# Patient Record
Sex: Male | Born: 1990 | Race: White | Hispanic: No | Marital: Single | Smoking: Current every day smoker
Health system: Southern US, Community
[De-identification: ages and names within clinical notes are randomized; demographics above are authoritative.]

## PROBLEM LIST (undated history)

## (undated) DIAGNOSIS — B1920 Unspecified viral hepatitis C without hepatic coma: Secondary | ICD-10-CM

## (undated) DIAGNOSIS — F1120 Opioid dependence, uncomplicated: Secondary | ICD-10-CM

## (undated) DIAGNOSIS — R569 Unspecified convulsions: Secondary | ICD-10-CM

## (undated) DIAGNOSIS — Z872 Personal history of diseases of the skin and subcutaneous tissue: Secondary | ICD-10-CM

## (undated) HISTORY — DX: Unspecified convulsions: R56.9

## (undated) HISTORY — PX: TYMPANOSTOMY TUBE PLACEMENT: SHX32

---
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
## 2010-12-04 ENCOUNTER — Emergency Department (HOSPITAL_COMMUNITY)
Admission: EM | Admit: 2010-12-04 | Discharge: 2010-12-04 | Payer: Self-pay | Source: Home / Self Care | Admitting: Emergency Medicine
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
## 2011-04-16 NOTE — Discharge Summary (Signed)
[name], [name] NO.:  [id number]   MEDICAL RECORD NO.:  [id number]          PATIENT TYPE:  INP   LOCATION:  6121                         FACILITY:  [facility]   PHYSICIAN:  Pediatrics Resident    DATE OF BIRTH:  Apr 11, 1991   DATE OF ADMISSION:  11/30/2004  DATE OF DISCHARGE:  12/01/2004                                 DISCHARGE SUMMARY   REASON FOR ADMISSION:  Xanax overdose, intentional ingestion.   SIGNIFICANT PHYSICAL FINDINGS:  The patient was somnolent and sleepy, but  arousable and responded to verbal commands.  Vital signs have been stable.  Urine drug screen was positive for benzodiazepines only.  CBC and chem-7  were within normal limits.  PTT, PT, and INR were also within normal limits.  TSH was high at 6.172.  Reference range was 0.35 to 5.5.  Free T4 and T4  were normal.  Free T3 was 4.3.  Reference range was 2.3 to 4.2.  T3 was high  at 214.4.   TREATMENT:  The patient was admitted for observation.  Child psych and  social work consulted.   OPERATION/PROCEDURE:  None.   FINAL DIAGNOSIS:  Xanax overdose.   DISCHARGE MEDICATIONS/INSTRUCTIONS:  The patient is to be transferred to  [facility] pending results.  Issues to be followed:  Thyroid  function.  Follow-up to be determined by [facility].   CONDITION ON DISCHARGE:  Stable.  This discharge summary has been faxed to  primary care physician, Dr. Sheliah Plane on December 01, 2004.       PR/MEDQ  D:  12/01/2004  T:  12/01/2004  Job:  [id number]
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
## 2013-05-20 ENCOUNTER — Emergency Department (HOSPITAL_COMMUNITY)
Admission: EM | Admit: 2013-05-20 | Discharge: 2013-05-20 | Disposition: A | Discharge status: Home / Self Care | Payer: Self-pay | Attending: Emergency Medicine | Admitting: Emergency Medicine

## 2013-05-20 ENCOUNTER — Encounter (HOSPITAL_COMMUNITY): Payer: Self-pay | Admitting: Family Medicine

## 2013-05-20 DIAGNOSIS — IMO0002 Reserved for concepts with insufficient information to code with codable children: Secondary | ICD-10-CM | POA: Insufficient documentation

## 2013-05-20 DIAGNOSIS — Z79899 Other long term (current) drug therapy: Secondary | ICD-10-CM | POA: Insufficient documentation

## 2013-05-20 DIAGNOSIS — L02419 Cutaneous abscess of limb, unspecified: Secondary | ICD-10-CM

## 2013-05-20 DIAGNOSIS — Z872 Personal history of diseases of the skin and subcutaneous tissue: Secondary | ICD-10-CM | POA: Insufficient documentation

## 2013-05-20 DIAGNOSIS — F1120 Opioid dependence, uncomplicated: Secondary | ICD-10-CM | POA: Insufficient documentation

## 2013-05-20 DIAGNOSIS — F17200 Nicotine dependence, unspecified, uncomplicated: Secondary | ICD-10-CM | POA: Insufficient documentation

## 2013-05-20 HISTORY — DX: Opioid dependence, uncomplicated: F11.20

## 2013-05-20 HISTORY — DX: Personal history of diseases of the skin and subcutaneous tissue: Z87.2

## 2013-05-20 MED ORDER — CEPHALEXIN 500 MG PO CAPS: 500.0000 mg | ORAL_CAPSULE | Freq: Two times a day (BID) | ORAL | Status: DC

## 2013-05-20 NOTE — ED Notes (Signed)
Pt reports boil under R armpit. Pt I&D boil with rzor blade. Pt reports boil came back x4 days ago. Pt has large golf ball size red area under R armpit. Pt in NAD and A&O. Resident at bedside.

## 2013-05-20 NOTE — ED Provider Notes (Signed)
History     CSN: [id number] Arrival date & time 05/20/13  1153 First MD Initiated Contact with Patient 05/20/13 1204      Chief Complaint  Patient presents with  . Recurrent Skin Infections   HPI  2 weeks ago noted large boil in right axilla and drained it with razor blade (new and used peroxide and burned). Large amount of pus came out. Drained for a few days then closed up. Went down almost completely but still red and slightly swollen.   Started coming back about 4-5 days ago. Difficult to bend arm or touch area. No fevers//vomiting. Occasional hot flash. Occasional chill. he gets nauseous at times when pain is really bad). No expanding redness but does have some satellite lesions around his abscess (small pimples).   Past Medical History  Diagnosis Date  . History of Stevens-Johnson toxic epidermal necrolysis overlap syndrome     with PCN, not with keflex  . Opiate addiction     on methadone    Past Surgical History  Procedure Laterality Date  . Tympanostomy tube placement      56-24 years old    Family History  Problem Relation Age of Onset  . Diabetes      grandparents    History  Substance Use Topics  . Smoking status: Current Every Day Smoker -- 0.50 packs/day for 5 years  . Smokeless tobacco: Not on file  . Alcohol Use: No   Review of Systems A full 10 point review of symptoms was performed and was negative except as noted in HPI.    Allergies  Review of patient's allergies indicates not on file.  Home Medications   Current Outpatient Rx  Name  Route  Sig  Dispense  Refill  . METHADONE HCL PO   Oral   Take 61 mg by mouth daily. [facility] at [facility].         . cephALEXin (KEFLEX) 500 MG capsule   Oral   Take 1 capsule (500 mg total) by mouth 2 (two) times daily. For 7 days   14 capsule   0     BP 116/71  Pulse 109  Temp(Src) 98.6 F (37 C) (Oral)  Resp 20  SpO2 100% Not tachy on my exam once numbed with anesthetic  and not in pain  Physical Exam  Constitutional: He is oriented to person, place, and time. He appears well-developed and well-nourished.  HENT:  Head: Normocephalic and atraumatic.  Eyes: EOM are normal. Pupils are equal, round, and reactive to light.  Neck: Normal range of motion. Neck supple.  Cardiovascular: Normal rate and regular rhythm.   Pulmonary/Chest: Effort normal and breath sounds normal.  Abdominal: Soft. Bowel sounds are normal.  Musculoskeletal: Normal range of motion. He exhibits no edema.  Neurological: He is alert and oriented to person, place, and time. Coordination normal.  Skin: Skin is warm and dry.  5 x 5 cm abscess in right axilla with 4-5 surrounding pustules. Small skin fold noted beside abscess with another area of fluctuance. Very tender to palpation.     ED Course  Procedures INCISION AND DRAINAGE Performed by: Tana Conch Consent: Verbal consent obtained. Risks and benefits: risks, benefits and alternatives were discussed Type: abscess  Body area: right axilla  Anesthesia: local infiltration  Incision was made with a scalpel.  Local anesthetic: lidocaine 1%  epinephrine  Anesthetic total: 10 ml  Complexity: complex Blunt dissection to break up loculations  Drainage: purulent,  large volume  Packing material: 1/4 in iodoform gauze  Patient tolerance: Patient tolerated the procedure well with no immediate complications.  1. Abscess, axilla    MDM  Due to satellite lesions and recurrence after previous drainage (even though it was by patient) started keflex x 7 days. Noted allergy history but patient has had keflex on multiple occasions in the past without reaction. Warned of risks of allergic reaction and patient to return to ED if these occur. Pain control with OTC medicine as well as patient's home methadone. Of note, patient likely tachycardic due to pain as noted to have normal HR when properly anesthetized.         Shelva Majestic, MD 05/20/13 [phone]

## 2013-05-23 NOTE — ED Provider Notes (Addendum)
I saw and evaluated the patient, reviewed the resident's note and I agree with the findings and plan.   .Face to face Exam:  General:  Awake HEENT:  Atraumatic Resp:  Normal effort Abd:  Nondistended Neuro:No focal weakness  I was present and available for consultation during incision and drainage of noted abscess.  Nelia Shi, MD 05/23/13 1526    Nelia Shi, MD 06/27/13 [phone]

## 2013-08-24 ENCOUNTER — Emergency Department (HOSPITAL_COMMUNITY)
Admission: EM | Admit: 2013-08-24 | Discharge: 2013-08-24 | Disposition: A | Discharge status: Home / Self Care | Payer: Self-pay | Attending: Emergency Medicine | Admitting: Emergency Medicine

## 2013-08-24 ENCOUNTER — Encounter (HOSPITAL_COMMUNITY): Payer: Self-pay

## 2013-08-24 DIAGNOSIS — R52 Pain, unspecified: Secondary | ICD-10-CM | POA: Insufficient documentation

## 2013-08-24 DIAGNOSIS — F1220 Cannabis dependence, uncomplicated: Secondary | ICD-10-CM | POA: Insufficient documentation

## 2013-08-24 DIAGNOSIS — F17200 Nicotine dependence, unspecified, uncomplicated: Secondary | ICD-10-CM | POA: Insufficient documentation

## 2013-08-24 DIAGNOSIS — F1110 Opioid abuse, uncomplicated: Secondary | ICD-10-CM

## 2013-08-24 DIAGNOSIS — F1120 Opioid dependence, uncomplicated: Secondary | ICD-10-CM | POA: Insufficient documentation

## 2013-08-24 DIAGNOSIS — R110 Nausea: Secondary | ICD-10-CM | POA: Insufficient documentation

## 2013-08-24 DIAGNOSIS — R6883 Chills (without fever): Secondary | ICD-10-CM | POA: Insufficient documentation

## 2013-08-24 DIAGNOSIS — Z872 Personal history of diseases of the skin and subcutaneous tissue: Secondary | ICD-10-CM | POA: Insufficient documentation

## 2013-08-24 DIAGNOSIS — R197 Diarrhea, unspecified: Secondary | ICD-10-CM | POA: Insufficient documentation

## 2013-08-24 LAB — COMPREHENSIVE METABOLIC PANEL
ALT: 11 U/L (ref 0–53)
AST: 14 U/L (ref 0–37)
Albumin: 4.5 g/dL (ref 3.5–5.2)
Alkaline Phosphatase: 78 U/L (ref 39–117)
BUN: 8 mg/dL (ref 6–23)
CO2: 26 mEq/L (ref 19–32)
Calcium: 9.7 mg/dL (ref 8.4–10.5)
Chloride: 104 mEq/L (ref 96–112)
Creatinine, Ser: 0.66 mg/dL (ref 0.50–1.35)
GFR calc Af Amer: >90 mL/min (ref >90)
GFR calc non Af Amer: >90 mL/min (ref >90)
Glucose, Bld: 113 mg/dL — ABNORMAL HIGH (ref 70–99)
Potassium: 3.6 mEq/L (ref 3.5–5.1)
Sodium: 140 mEq/L (ref 135–145)
Total Bilirubin: 0.3 mg/dL (ref 0.3–1.2)
Total Protein: 7.7 g/dL (ref 6.0–8.3)

## 2013-08-24 LAB — CBC
HCT: 40.9 % (ref 39.0–52.0)
Hemoglobin: 14.6 g/dL (ref 13.0–17.0)
MCH: 29.3 pg (ref 26.0–34.0)
MCHC: 35.7 g/dL (ref 30.0–36.0)
MCV: 82.1 fL (ref 78.0–100.0)
Platelets: 220 10*3/uL (ref 150–400)
RBC: 4.98 MIL/uL (ref 4.22–5.81)
RDW: 12.2 % (ref 11.5–15.5)
WBC: 6 10*3/uL (ref 4.0–10.5)

## 2013-08-24 LAB — RAPID URINE DRUG SCREEN, HOSP PERFORMED
Amphetamines: NOT DETECTED
Barbiturates: NOT DETECTED
Benzodiazepines: NOT DETECTED
Cocaine: NOT DETECTED
Opiates: NOT DETECTED
Tetrahydrocannabinol: POSITIVE — AB

## 2013-08-24 LAB — ACETAMINOPHEN LEVEL: Acetaminophen (Tylenol), Serum: <15 ug/mL (ref 10–30)

## 2013-08-24 LAB — SALICYLATE LEVEL: Salicylate Lvl: <2 mg/dL — ABNORMAL LOW (ref 2.8–20.0)

## 2013-08-24 LAB — ETHANOL: Alcohol, Ethyl (B): <11 mg/dL (ref 0–11)

## 2013-08-24 MED ORDER — IBUPROFEN 400 MG PO TABS: 600.0000 mg | ORAL_TABLET | Freq: Three times a day (TID) | ORAL | Status: DC | PRN

## 2013-08-24 MED ORDER — LORAZEPAM 1 MG PO TABS: 1.0000 mg | ORAL_TABLET | Freq: Two times a day (BID) | ORAL | Status: DC

## 2013-08-24 MED ORDER — CLONIDINE HCL 0.1 MG PO TABS: 0.1000 mg | ORAL_TABLET | ORAL | Status: DC

## 2013-08-24 MED ORDER — ONDANSETRON HCL 4 MG PO TABS: 4.0000 mg | ORAL_TABLET | Freq: Three times a day (TID) | ORAL | Status: DC | PRN

## 2013-08-24 MED ORDER — LORAZEPAM 1 MG PO TABS
1.0000 mg | ORAL_TABLET | Freq: Once | ORAL | Status: AC
  Administered 2013-08-24: 1 mg via ORAL
  Filled 2013-08-24: qty 1

## 2013-08-24 MED ORDER — CLONIDINE HCL 0.1 MG PO TABS: 0.1000 mg | ORAL_TABLET | Freq: Every day | ORAL | Status: DC

## 2013-08-24 MED ORDER — CLONIDINE HCL 0.1 MG PO TABS
0.1000 mg | ORAL_TABLET | Freq: Two times a day (BID) | ORAL | Status: DC
  Administered 2013-08-24: 0.1 mg via ORAL
  Filled 2013-08-24: qty 1

## 2013-08-24 MED ORDER — DIPHENOXYLATE-ATROPINE 2.5-0.025 MG PO TABS
2.0000 | ORAL_TABLET | Freq: Once | ORAL | Status: AC
  Administered 2013-08-24: 2 via ORAL
  Filled 2013-08-24: qty 2

## 2013-08-24 MED ORDER — NAPROXEN 250 MG PO TABS: 500.0000 mg | ORAL_TABLET | Freq: Two times a day (BID) | ORAL | Status: DC | PRN

## 2013-08-24 MED ORDER — ACETAMINOPHEN 325 MG PO TABS: 650.0000 mg | ORAL_TABLET | ORAL | Status: DC | PRN

## 2013-08-24 MED ORDER — NICOTINE 21 MG/24HR TD PT24
21.0000 mg | MEDICATED_PATCH | Freq: Every day | TRANSDERMAL | Status: DC
  Administered 2013-08-24: 21 mg via TRANSDERMAL
  Filled 2013-08-24: qty 1

## 2013-08-24 MED ORDER — HYDROXYZINE HCL 25 MG PO TABS: 25.0000 mg | ORAL_TABLET | Freq: Four times a day (QID) | ORAL | Status: DC | PRN

## 2013-08-24 MED ORDER — LOPERAMIDE HCL 2 MG PO CAPS: 2.0000 mg | ORAL_CAPSULE | ORAL | Status: DC | PRN

## 2013-08-24 MED ORDER — DICYCLOMINE HCL 20 MG PO TABS: 20.0000 mg | ORAL_TABLET | Freq: Four times a day (QID) | ORAL | Status: DC | PRN

## 2013-08-24 MED ORDER — METHOCARBAMOL 500 MG PO TABS: 500.0000 mg | ORAL_TABLET | Freq: Three times a day (TID) | ORAL | Status: DC | PRN

## 2013-08-24 MED ORDER — CLONIDINE HCL 0.1 MG PO TABS: 0.1000 mg | ORAL_TABLET | Freq: Four times a day (QID) | ORAL | Status: DC

## 2013-08-24 MED ORDER — ZOLPIDEM TARTRATE 5 MG PO TABS: 5.0000 mg | ORAL_TABLET | Freq: Every evening | ORAL | Status: DC | PRN

## 2013-08-24 MED ORDER — ONDANSETRON 4 MG PO TBDP: 4.0000 mg | ORAL_TABLET | Freq: Four times a day (QID) | ORAL | Status: DC | PRN

## 2013-08-24 NOTE — BH Assessment (Signed)
[facility] Assessment Progress Note Update:  Called and spoke with pt's nurses, Alcario Drought and [place], and scheduled pt's tele assessment for 1145, as pt is in Pod A and is in process of moving to Pod C.

## 2013-08-24 NOTE — ED Provider Notes (Signed)
Medical screening examination/treatment/procedure(s) were performed by non-physician practitioner and as supervising physician I was immediately available for consultation/collaboration.   Loren Racer, MD 08/24/13 1332

## 2013-08-24 NOTE — ED Notes (Signed)
Tele-psych at bedside.

## 2013-08-24 NOTE — ED Notes (Signed)
Pt requesting detox from Opana, pt reports using intravenous Opana x1 month, last used was 1800 last pm. Pt reports he was clean for 6 months and relapsed 1 month ago. Pt reports he feels sick this am and is having restless leg syndrome. Pt is anxious in triage, but calm and cooperative

## 2013-08-24 NOTE — ED Provider Notes (Signed)
CSN: [id number]     Arrival date & time 08/24/13  0759 History   First MD Initiated Contact with Patient 08/24/13 [phone]     Chief Complaint  Patient presents with  . Medical Clearance   (Consider location/radiation/quality/duration/timing/severity/associated sxs/prior Treatment) HPI [name] is a 22 y.o. male who presents to emergency department requesting detox from IV for pain. States has history of using opiates. Has been clean for 6 months. States that he started using again a month ago and has been using it daily. Last use was last night at 6 PM. Patient states that he is withdrawing from not using this morning. States that he is having body aches and chills, diarrhea, nausea. Patient states that last time he went through a methadone program to detox. Patient denies any other drug or alcohol use. He denies any suicidal homicidal ideations. No other complaints. Past Medical History  Diagnosis Date  . History of Stevens-Johnson toxic epidermal necrolysis overlap syndrome     with PCN, not with keflex  . Opiate addiction     on methadone   Past Surgical History  Procedure Laterality Date  . Tympanostomy tube placement      30-52 years old   Family History  Problem Relation Age of Onset  . Diabetes      grandparents   History  Substance Use Topics  . Smoking status: Current Every Day Smoker -- 0.50 packs/day for 5 years  . Smokeless tobacco: Not on file  . Alcohol Use: No    Review of Systems  Constitutional: Negative for fever and chills.  HENT: Negative for neck pain and neck stiffness.   Respiratory: Negative for cough, chest tightness and shortness of breath.   Cardiovascular: Negative for chest pain, palpitations and leg swelling.  Gastrointestinal: Positive for nausea and diarrhea. Negative for vomiting, abdominal pain and abdominal distention.  Genitourinary: Negative for dysuria, urgency, frequency and hematuria.  Musculoskeletal: Positive for myalgias. Negative  for arthralgias.  Skin: Negative for rash.  Allergic/Immunologic: Negative for immunocompromised state.  Neurological: Negative for dizziness, weakness, light-headedness, numbness and headaches.    Allergies  Amoxicillin; Ceclor; Erythromycin; Penicillins; and Septra  Home Medications  No current outpatient prescriptions on file. BP 133/76  Pulse 88  Temp(Src) 98.1 F (36.7 C) (Oral)  Resp 24  Ht 5\' 6"  (1.676 m)  Wt 150 lb (68.04 kg)  BMI 24.22 kg/m2  SpO2 99% Physical Exam  Nursing note and vitals reviewed. Constitutional: He appears well-developed and well-nourished. No distress.  HENT:  Head: Normocephalic and atraumatic.  Eyes: Conjunctivae are normal.  Neck: Neck supple.  Cardiovascular: Normal rate, regular rhythm and normal heart sounds.   Pulmonary/Chest: Effort normal. No respiratory distress. He has no wheezes. He has no rales.  Abdominal: Soft. Bowel sounds are normal. He exhibits no distension. There is no tenderness. There is no rebound.  Musculoskeletal: He exhibits no edema.  Neurological: He is alert.  Skin: Skin is warm and dry.  Psychiatric: He has a normal mood and affect. His behavior is normal.    ED Course  Procedures (including critical care time) Labs Review Labs Reviewed  COMPREHENSIVE METABOLIC PANEL - Abnormal; Notable for the following:    Glucose, Bld 113 (*)    All other components within normal limits  SALICYLATE LEVEL - Abnormal; Notable for the following:    Salicylate Lvl <2.0 (*)    All other components within normal limits  URINE RAPID DRUG SCREEN (HOSP PERFORMED) - Abnormal; Notable for the  following:    Tetrahydrocannabinol POSITIVE (*)    All other components within normal limits  ACETAMINOPHEN LEVEL  CBC  ETHANOL   Imaging Review No results found.  MDM   1. Opioid abuse     8:56 AM Patient seen and examined. Explained that we normally do not do opiate detox and says in patients. Patient is in no acute distress and  does not appear to be acutely. I did speak with a case manager however who stated that week and medically clear patient and make a contact team to see if patient can be placed into an opiate detox facility in [place]. Patient also stated that he has a friend that was here 2 days ago and was admitted to an inpatient rehabilitation facility for opiate withdrawals. Patient is an appendectomy because that were not consistent. I will get labs on him and try to call to see if they cannot help him out  11:27 AM Pt is medically cleared. Spoke with [facility]. Will try to arrange inpatient admission. If unable, pt OK to be discharged home with outpatient follow up.   1:26 PM Pt was offered to go to [facility] for detox. Pt refused. He is medically cleared and stable for d/c home.     Lottie Mussel, PA-C 08/24/13 1327
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
## 2014-04-06 ENCOUNTER — Encounter (HOSPITAL_COMMUNITY): Payer: Self-pay | Admitting: Emergency Medicine

## 2014-04-06 ENCOUNTER — Emergency Department (HOSPITAL_COMMUNITY)
Admission: EM | Admit: 2014-04-06 | Discharge: 2014-04-06 | Disposition: A | Discharge status: Home / Self Care | Payer: Self-pay | Attending: Emergency Medicine | Admitting: Emergency Medicine

## 2014-04-06 DIAGNOSIS — F17200 Nicotine dependence, unspecified, uncomplicated: Secondary | ICD-10-CM | POA: Insufficient documentation

## 2014-04-06 DIAGNOSIS — Z8619 Personal history of other infectious and parasitic diseases: Secondary | ICD-10-CM | POA: Insufficient documentation

## 2014-04-06 DIAGNOSIS — Z872 Personal history of diseases of the skin and subcutaneous tissue: Secondary | ICD-10-CM | POA: Insufficient documentation

## 2014-04-06 DIAGNOSIS — Z880 Allergy status to penicillin: Secondary | ICD-10-CM | POA: Insufficient documentation

## 2014-04-06 DIAGNOSIS — IMO0002 Reserved for concepts with insufficient information to code with codable children: Secondary | ICD-10-CM | POA: Insufficient documentation

## 2014-04-06 DIAGNOSIS — L0291 Cutaneous abscess, unspecified: Secondary | ICD-10-CM

## 2014-04-06 HISTORY — DX: Unspecified viral hepatitis C without hepatic coma: B19.20

## 2014-04-06 MED ORDER — SULFAMETHOXAZOLE-TMP DS 800-160 MG PO TABS
2.0000 | ORAL_TABLET | Freq: Once | ORAL | Status: AC
  Administered 2014-04-06: 2 via ORAL
  Filled 2014-04-06: qty 2

## 2014-04-06 MED ORDER — CEPHALEXIN 500 MG PO CAPS: 500.0000 mg | ORAL_CAPSULE | Freq: Four times a day (QID) | ORAL | Status: DC

## 2014-04-06 MED ORDER — HYDROCODONE-ACETAMINOPHEN 5-325 MG PO TABS: 2.0000 | ORAL_TABLET | ORAL | Status: DC | PRN

## 2014-04-06 MED ORDER — DOXYCYCLINE HYCLATE 100 MG PO CAPS: 100.0000 mg | ORAL_CAPSULE | Freq: Two times a day (BID) | ORAL | Status: DC

## 2014-04-06 MED ORDER — OXYCODONE-ACETAMINOPHEN 5-325 MG PO TABS
2.0000 | ORAL_TABLET | Freq: Once | ORAL | Status: AC
  Administered 2014-04-06: 2 via ORAL
  Filled 2014-04-06: qty 2

## 2014-04-06 MED ORDER — DOXYCYCLINE HYCLATE 100 MG PO TABS
100.0000 mg | ORAL_TABLET | Freq: Once | ORAL | Status: AC
  Administered 2014-04-06: 100 mg via ORAL
  Filled 2014-04-06: qty 1

## 2014-04-06 NOTE — ED Notes (Signed)
Follow up in 48 hours
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
## 2014-04-06 NOTE — Discharge Instructions (Signed)
Re check in ER in 48 hours. Take antibiotics as prescribed.  Abscess Care After An abscess (also called a boil or furuncle) is an infected area that contains a collection of pus. Signs and symptoms of an abscess include pain, tenderness, redness, or hardness, or you may feel a moveable soft area under your skin. An abscess can occur anywhere in the body. The infection may spread to surrounding tissues causing cellulitis. A cut (incision) by the surgeon was made over your abscess and the pus was drained out. Gauze may have been packed into the space to provide a drain that will allow the cavity to heal from the inside outwards. The boil may be painful for 5 to 7 days. Most people with a boil do not have high fevers. Your abscess, if seen early, may not have localized, and may not have been lanced. If not, another appointment may be required for this if it does not get better on its own or with medications. HOME CARE INSTRUCTIONS   Only take over-the-counter or prescription medicines for pain, discomfort, or fever as directed by your caregiver.  When you bathe, soak and then remove gauze or iodoform packs at least daily or as directed by your caregiver. You may then wash the wound gently with mild soapy water. Repack with gauze or do as your caregiver directs. SEEK IMMEDIATE MEDICAL CARE IF:   You develop increased pain, swelling, redness, drainage, or bleeding in the wound site.  You develop signs of generalized infection including muscle aches, chills, fever, or a general ill feeling.  An oral temperature above 102 F (38.9 C) develops, not controlled by medication. See your caregiver for a recheck if you develop any of the symptoms described above. If medications (antibiotics) were prescribed, take them as directed. Document Released: 06/03/2005 Document Revised: 02/07/2012 Document Reviewed: 01/29/2008 [facility] Patient Information 2014 [place].
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
## 2014-09-24 ENCOUNTER — Encounter (HOSPITAL_COMMUNITY): Payer: Self-pay | Admitting: Emergency Medicine

## 2014-09-24 ENCOUNTER — Emergency Department (HOSPITAL_COMMUNITY)
Admission: EM | Admit: 2014-09-24 | Discharge: 2014-09-24 | Disposition: A | Discharge status: Home / Self Care | Payer: Self-pay | Attending: Emergency Medicine | Admitting: Emergency Medicine

## 2014-09-24 DIAGNOSIS — L2389 Allergic contact dermatitis due to other agents: Secondary | ICD-10-CM | POA: Insufficient documentation

## 2014-09-24 DIAGNOSIS — L259 Unspecified contact dermatitis, unspecified cause: Secondary | ICD-10-CM

## 2014-09-24 DIAGNOSIS — Z720 Tobacco use: Secondary | ICD-10-CM | POA: Insufficient documentation

## 2014-09-24 DIAGNOSIS — Z8619 Personal history of other infectious and parasitic diseases: Secondary | ICD-10-CM | POA: Insufficient documentation

## 2014-09-24 DIAGNOSIS — Z880 Allergy status to penicillin: Secondary | ICD-10-CM | POA: Insufficient documentation

## 2014-09-24 DIAGNOSIS — L509 Urticaria, unspecified: Secondary | ICD-10-CM

## 2014-09-24 MED ORDER — PREDNISONE 20 MG PO TABS: 40.0000 mg | ORAL_TABLET | Freq: Every day | ORAL | Status: DC

## 2014-09-24 MED ORDER — DIPHENHYDRAMINE HCL 25 MG PO TABS: 25.0000 mg | ORAL_TABLET | Freq: Four times a day (QID) | ORAL | Status: DC | PRN

## 2014-09-24 MED ORDER — FAMOTIDINE IN NACL 20-0.9 MG/50ML-% IV SOLN
20.0000 mg | Freq: Once | INTRAVENOUS | Status: AC
  Administered 2014-09-24: 20 mg via INTRAVENOUS
  Filled 2014-09-24: qty 50

## 2014-09-24 MED ORDER — DIPHENHYDRAMINE HCL 50 MG/ML IJ SOLN
50.0000 mg | Freq: Once | INTRAMUSCULAR | Status: AC
  Administered 2014-09-24: 50 mg via INTRAVENOUS
  Filled 2014-09-24: qty 1

## 2014-09-24 MED ORDER — FAMOTIDINE 20 MG PO TABS: 20.0000 mg | ORAL_TABLET | Freq: Two times a day (BID) | ORAL | Status: DC

## 2014-09-24 MED ORDER — METHYLPREDNISOLONE SODIUM SUCC 125 MG IJ SOLR
125.0000 mg | Freq: Once | INTRAMUSCULAR | Status: AC
  Administered 2014-09-24: 125 mg via INTRAVENOUS
  Filled 2014-09-24: qty 2

## 2014-09-24 NOTE — ED Provider Notes (Signed)
Medical screening examination/treatment/procedure(s) were performed by non-physician practitioner and as supervising physician I was immediately available for consultation/collaboration.   EKG Interpretation None       Charlesetta Shanks, MD 09/24/14 2336

## 2014-09-24 NOTE — ED Notes (Signed)
Pt states 2 nights ago started having rash.  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  However was in hotel since last Tuesday and no symptoms until 2 days ago.  Was at friends house to visit prior to happening but did not sleep there.  No new linens, clothes,  Foods.

## 2014-09-24 NOTE — Discharge Instructions (Signed)
1. Medications: Prednisone, Benadryl, Pepcid, hydrocortisone lotion, usual home medications 2. Treatment: rest, drink plenty of fluids, take medications as prescribed 3. Follow Up: Please followup with your primary doctor in 3 days for discussion of your diagnoses and further evaluation after today's visit; if you do not have a primary care doctor use the resource guide provided to find one; followup with dermatology as needed; Return to the ER for difficulty breathing, return of allergic reaction or other concerning symptoms  Contact Dermatitis Contact dermatitis is a reaction to certain substances that touch the skin. Contact dermatitis can be either irritant contact dermatitis or allergic contact dermatitis. Irritant contact dermatitis does not require previous exposure to the substance for a reaction to occur.Allergic contact dermatitis only occurs if you have been exposed to the substance before. Upon a repeat exposure, your body reacts to the substance.  CAUSES  Many substances can cause contact dermatitis. Irritant dermatitis is most commonly caused by repeated exposure to mildly irritating substances, such as:  Makeup.  Soaps.  Detergents.  Bleaches.  Acids.  Metal salts, such as nickel. Allergic contact dermatitis is most commonly caused by exposure to:  Poisonous plants.  Chemicals (deodorants, shampoos).  Jewelry.  Latex.  Neomycin in triple antibiotic cream.  Preservatives in products, including clothing. SYMPTOMS  The area of skin that is exposed may develop:  Dryness or flaking.  Redness.  Cracks.  Itching.  Pain or a burning sensation.  Blisters. With allergic contact dermatitis, there may also be swelling in areas such as the eyelids, mouth, or genitals.  DIAGNOSIS  Your caregiver can usually tell what the problem is by doing a physical exam. In cases where the cause is uncertain and an allergic contact dermatitis is suspected, a patch skin test may  be performed to help determine the cause of your dermatitis. TREATMENT Treatment includes protecting the skin from further contact with the irritating substance by avoiding that substance if possible. Barrier creams, powders, and gloves may be helpful. Your caregiver may also recommend:  Steroid creams or ointments applied 2 times daily. For best results, soak the rash area in cool water for 20 minutes. Then apply the medicine. Cover the area with a plastic wrap. You can store the steroid cream in the refrigerator for a "chilly" effect on your rash. That may decrease itching. Oral steroid medicines may be needed in more severe cases.  Antibiotics or antibacterial ointments if a skin infection is present.  Antihistamine lotion or an antihistamine taken by mouth to ease itching.  Lubricants to keep moisture in your skin.  Burow's solution to reduce redness and soreness or to dry a weeping rash. Mix one packet or tablet of solution in 2 cups cool water. Dip a clean washcloth in the mixture, wring it out a bit, and put it on the affected area. Leave the cloth in place for 30 minutes. Do this as often as possible throughout the day.  Taking several cornstarch or baking soda baths daily if the area is too large to cover with a washcloth. Harsh chemicals, such as alkalis or acids, can cause skin damage that is like a burn. You should flush your skin for 15 to 20 minutes with cold water after such an exposure. You should also seek immediate medical care after exposure. Bandages (dressings), antibiotics, and pain medicine may be needed for severely irritated skin.  HOME CARE INSTRUCTIONS  Avoid the substance that caused your reaction.  Keep the area of skin that is affected away from  hot water, soap, sunlight, chemicals, acidic substances, or anything else that would irritate your skin.  Do not scratch the rash. Scratching may cause the rash to become infected.  You may take cool baths to help stop  the itching.  Only take over-the-counter or prescription medicines as directed by your caregiver.  See your caregiver for follow-up care as directed to make sure your skin is healing properly. SEEK MEDICAL CARE IF:   Your condition is not better after 3 days of treatment.  You seem to be getting worse.  You see signs of infection such as swelling, tenderness, redness, soreness, or warmth in the affected area.  You have any problems related to your medicines. Document Released: 11/12/2000 Document Revised: 02/07/2012 Document Reviewed: 04/20/2011 [facility] Patient Information 2015 [place], [place]. This information is not intended to replace advice given to you by your health care provider. Make sure you discuss any questions you have with your health care provider.

## 2014-09-24 NOTE — ED Provider Notes (Signed)
CSN: [id number]     Arrival date & time 09/24/14  1255 History   First MD Initiated Contact with Patient 09/24/14 1503     Chief Complaint  Patient presents with  . Rash     (Consider location/radiation/quality/duration/timing/severity/associated sxs/prior Treatment) The history is provided by the patient, a parent and medical records. No language interpreter was used.    [name] is a 23 y.o. male  with a hx of opiate addiction, Hep C, Hx of SJS with all abx presents to the Emergency Department complaining of gradual, persistent, progressively worsening rash onset 2 days ago.  Pt reports he was at a friends house when he noticed the rash on his abd.  He states he travels for work and is staying in a [employer]otherap[employer] hotel.  He denies seeing bed bugs, but did not look for them either.  Pt denies treatment PTA.  Pt reports the rash is red, raised and itching located over most of his body (sparing the genital region).  No aggravating or alleviating factors.  Pt denies fever, chills, headache, neck pain, chest pain, shortness of breath, abdominal pain, nausea, vomiting, diarrhea, weakness, dizziness, syncope, dysuria..     Past Medical History  Diagnosis Date  . History of Stevens-Johnson toxic epidermal necrolysis overlap syndrome     with PCN, not with keflex  . Opiate addiction     on methadone  . Hepatitis C    Past Surgical History  Procedure Laterality Date  . Tympanostomy tube placement      5-59 years old   Family History  Problem Relation Age of Onset  . Diabetes      grandparents   History  Substance Use Topics  . Smoking status: Current Every Day Smoker -- 0.50 packs/day for 5 years  . Smokeless tobacco: Not on file  . Alcohol Use: No    Review of Systems  Constitutional: Negative for fever, diaphoresis, appetite change, fatigue and unexpected weight change.  HENT: Negative for mouth sores.   Eyes: Negative for visual disturbance.  Respiratory: Negative for cough,  chest tightness, shortness of breath and wheezing.   Cardiovascular: Negative for chest pain.  Gastrointestinal: Negative for nausea, vomiting, abdominal pain, diarrhea and constipation.  Endocrine: Negative for polydipsia, polyphagia and polyuria.  Genitourinary: Negative for dysuria, urgency, frequency and hematuria.  Musculoskeletal: Negative for back pain and neck stiffness.  Skin: Positive for rash.  Allergic/Immunologic: Negative for immunocompromised state.  Neurological: Negative for syncope, light-headedness and headaches.  Hematological: Does not bruise/bleed easily.  Psychiatric/Behavioral: Negative for sleep disturbance. The patient is not nervous/anxious.       Allergies  Amoxicillin; Ceclor; Erythromycin; Penicillins; and Septra  Home Medications   Prior to Admission medications   Medication Sig Start Date End Date Taking? Authorizing Provider  diphenhydrAMINE (BENADRYL) 25 MG tablet Take 1 tablet (25 mg total) by mouth every 6 (six) hours as needed for itching (Rash). 09/24/14   Kirkland Figg, PA-C  famotidine (PEPCID) 20 MG tablet Take 1 tablet (20 mg total) by mouth 2 (two) times daily. 09/24/14   Sumit Branham, PA-C  predniSONE (DELTASONE) 20 MG tablet Take 2 tablets (40 mg total) by mouth daily. 09/24/14   Lusine Corlett, PA-C   BP 116/61  Pulse 98  Temp(Src) 97.9 F (36.6 C) (Oral)  Resp 16  SpO2 100% Physical Exam  Nursing note and vitals reviewed. Constitutional: He is oriented to person, place, and time. He appears well-developed and well-nourished. No distress.  HENT:  Head:  Normocephalic and atraumatic.  Right Ear: Tympanic membrane, external ear and ear canal normal.  Left Ear: Tympanic membrane, external ear and ear canal normal.  Nose: Nose normal. No mucosal edema or rhinorrhea.  Mouth/Throat: Uvula is midline. No uvula swelling. No oropharyngeal exudate, posterior oropharyngeal edema, posterior oropharyngeal erythema or tonsillar  abscesses.  No swelling of the uvula or oropharynx   Eyes: Conjunctivae are normal.  Neck: Normal range of motion.  Patent airway No stridor; normal phonation Handling secretions without difficulty  Cardiovascular: Normal rate, normal heart sounds and intact distal pulses.   No murmur heard. Pulmonary/Chest: Effort normal and breath sounds normal. No stridor. No respiratory distress. He has no wheezes.  No wheezes or rhonchi  Abdominal: Soft. Bowel sounds are normal. There is no tenderness.  Musculoskeletal: Normal range of motion. He exhibits no edema.  Neurological: He is alert and oriented to person, place, and time.  Skin: Skin is warm and dry. Rash noted. He is not diaphoretic.  Urticaria noted - Erythematous raised rash covering arms, legs, torso and back; Some rash noted to the cheeks and behind the patient's ears and on his neck Mild excoriations - no induration or fluctuance to indicate secondary infection  Psychiatric: He has a normal mood and affect.    ED Course  Procedures (including critical care time) Labs Review Labs Reviewed - No data to display  Imaging Review No results found.   EKG Interpretation None      MDM   Final diagnoses:  Contact dermatitis  Urticaria   [name] presents with urticaria, likely contact dermatitis from new environment versus possibility of allergic reaction to bed bugs. Patient will be given Benadryl, Pepcid and Solu-Medrol. Will assess for a short time.  5:15 PM Patient re-evaluated prior to dc, is hemodynamically stable, in no respiratory distress, and denies the feeling of throat closing. Pt has been advised to take OTC benadryl & return to the ED if they have a mod-severe allergic rxn (s/s including throat closing, difficulty breathing, swelling of lips face or tongue).  Significant improvement in rash. Pt is to follow up with their PCP. Pt is agreeable with plan & verbalizes understanding.  I have personally reviewed  patient's vitals, nursing note and any pertinent labs or imaging.  I performed an undressed physical exam.    It has been determined that no acute conditions requiring further emergency intervention are present at this time. The patient/guardian have been advised of the diagnosis and plan. I reviewed all labs and imaging including any potential incidental findings. We have discussed signs and symptoms that warrant return to the ED and they are listed in the discharge instructions.    Vital signs are stable at discharge.   BP 116/61  Pulse 98  Temp(Src) 97.9 F (36.6 C) (Oral)  Resp 16  SpO2 100%         Abigail Butts, PA-C 09/24/14 1716

## 2014-09-24 NOTE — Progress Notes (Signed)
  CARE MANAGEMENT ED NOTE 09/24/2014  Patient:  JAYION, SCHNECK   Account Number:  [id number]  Date Initiated:  09/24/2014  Documentation initiated by:  Livia Snellen  Subjective/Objective Assessment:   patient presents to Ed with generalized rash     Subjective/Objective Assessment Detail:     Action/Plan:   Action/Plan Detail:   Anticipated DC Date:  09/24/2014     Status Recommendation to Physician:   Result of Recommendation:    Other ED [place]  Other  PCP issues    Choice offered to / List presented to:            Status of service:  Completed, signed off  ED Comments:   ED Comments Detail:  EDCM spoke to patient and his mother at bedside.  Patient's nmother reports patient's pcp is located at [facility] in [place].  Patient is also without insurance. Patient's mother also requesting dental assistance.  EDCM provided patient and his mother financial resources in the community for both [place] such as DSS, salvation armies, local churches, free or low income clinics, list of dentists who will accept patient s without insurnace in [place] and [employer].  Patient and his mother thankful for resources.  No further EDCM needs at this time.

## 2014-10-16 ENCOUNTER — Emergency Department (HOSPITAL_COMMUNITY): Payer: Self-pay

## 2014-10-16 ENCOUNTER — Encounter (HOSPITAL_COMMUNITY): Payer: Self-pay | Admitting: Emergency Medicine

## 2014-10-16 ENCOUNTER — Inpatient Hospital Stay (HOSPITAL_COMMUNITY)
Admission: EM | Admit: 2014-10-16 | Discharge: 2014-10-19 | DRG: 602 | Disposition: A | Discharge status: Home / Self Care | Payer: Self-pay | Attending: Internal Medicine | Admitting: Internal Medicine

## 2014-10-16 DIAGNOSIS — B1920 Unspecified viral hepatitis C without hepatic coma: Secondary | ICD-10-CM | POA: Diagnosis present

## 2014-10-16 DIAGNOSIS — F1120 Opioid dependence, uncomplicated: Secondary | ICD-10-CM | POA: Diagnosis present

## 2014-10-16 DIAGNOSIS — Z79899 Other long term (current) drug therapy: Secondary | ICD-10-CM

## 2014-10-16 DIAGNOSIS — F17210 Nicotine dependence, cigarettes, uncomplicated: Secondary | ICD-10-CM | POA: Diagnosis present

## 2014-10-16 DIAGNOSIS — F1990 Other psychoactive substance use, unspecified, uncomplicated: Secondary | ICD-10-CM

## 2014-10-16 DIAGNOSIS — Z720 Tobacco use: Secondary | ICD-10-CM | POA: Diagnosis present

## 2014-10-16 DIAGNOSIS — L0291 Cutaneous abscess, unspecified: Secondary | ICD-10-CM

## 2014-10-16 DIAGNOSIS — Z791 Long term (current) use of non-steroidal anti-inflammatories (NSAID): Secondary | ICD-10-CM

## 2014-10-16 DIAGNOSIS — L03114 Cellulitis of left upper limb: Principal | ICD-10-CM | POA: Diagnosis present

## 2014-10-16 DIAGNOSIS — L0390 Cellulitis, unspecified: Secondary | ICD-10-CM

## 2014-10-16 DIAGNOSIS — Z800 Family history of malignant neoplasm of digestive organs: Secondary | ICD-10-CM

## 2014-10-16 DIAGNOSIS — F1290 Cannabis use, unspecified, uncomplicated: Secondary | ICD-10-CM | POA: Diagnosis present

## 2014-10-16 DIAGNOSIS — Z23 Encounter for immunization: Secondary | ICD-10-CM

## 2014-10-16 DIAGNOSIS — Z7982 Long term (current) use of aspirin: Secondary | ICD-10-CM

## 2014-10-16 DIAGNOSIS — Z823 Family history of stroke: Secondary | ICD-10-CM

## 2014-10-16 DIAGNOSIS — A419 Sepsis, unspecified organism: Secondary | ICD-10-CM | POA: Diagnosis present

## 2014-10-16 DIAGNOSIS — Z7952 Long term (current) use of systemic steroids: Secondary | ICD-10-CM

## 2014-10-16 DIAGNOSIS — Z881 Allergy status to other antibiotic agents status: Secondary | ICD-10-CM

## 2014-10-16 DIAGNOSIS — S40852A Superficial foreign body of left upper arm, initial encounter: Secondary | ICD-10-CM | POA: Diagnosis present

## 2014-10-16 DIAGNOSIS — F1910 Other psychoactive substance abuse, uncomplicated: Secondary | ICD-10-CM | POA: Diagnosis present

## 2014-10-16 DIAGNOSIS — Z880 Allergy status to penicillin: Secondary | ICD-10-CM

## 2014-10-16 DIAGNOSIS — Z833 Family history of diabetes mellitus: Secondary | ICD-10-CM

## 2014-10-16 DIAGNOSIS — S40852D Superficial foreign body of left upper arm, subsequent encounter: Secondary | ICD-10-CM

## 2014-10-16 LAB — COMPREHENSIVE METABOLIC PANEL
ALT: 59 U/L — ABNORMAL HIGH (ref 0–53)
ANION GAP: 13 (ref 5–15)
AST: 35 U/L (ref 0–37)
Albumin: 3.5 g/dL (ref 3.5–5.2)
Alkaline Phosphatase: 74 U/L (ref 39–117)
BUN: 11 mg/dL (ref 6–23)
CALCIUM: 9 mg/dL (ref 8.4–10.5)
CO2: 24 mEq/L (ref 19–32)
CREATININE: 0.71 mg/dL (ref 0.50–1.35)
Chloride: 99 mEq/L (ref 96–112)
GFR calc non Af Amer: >90 mL/min (ref >90)
GLUCOSE: 114 mg/dL — AB (ref 70–99)
Potassium: 3.9 mEq/L (ref 3.7–5.3)
Sodium: 136 mEq/L — ABNORMAL LOW (ref 137–147)
TOTAL PROTEIN: 7.3 g/dL (ref 6.0–8.3)
Total Bilirubin: 0.4 mg/dL (ref 0.3–1.2)

## 2014-10-16 LAB — CBC WITH DIFFERENTIAL/PLATELET
Basophils Absolute: 0 10*3/uL (ref 0.0–0.1)
Basophils Relative: 0 % (ref 0–1)
EOS ABS: 0 10*3/uL (ref 0.0–0.7)
EOS PCT: 0 % (ref 0–5)
HEMATOCRIT: 36 % — AB (ref 39.0–52.0)
Hemoglobin: 12.1 g/dL — ABNORMAL LOW (ref 13.0–17.0)
LYMPHS PCT: 25 % (ref 12–46)
Lymphs Abs: 2.8 10*3/uL (ref 0.7–4.0)
MCH: 29.2 pg (ref 26.0–34.0)
MCHC: 33.6 g/dL (ref 30.0–36.0)
MCV: 87 fL (ref 78.0–100.0)
MONO ABS: 1 10*3/uL (ref 0.1–1.0)
Monocytes Relative: 9 % (ref 3–12)
Neutro Abs: 7.2 10*3/uL (ref 1.7–7.7)
Neutrophils Relative %: 66 % (ref 43–77)
Platelets: 189 10*3/uL (ref 150–400)
RBC: 4.14 MIL/uL — ABNORMAL LOW (ref 4.22–5.81)
RDW: 12.8 % (ref 11.5–15.5)
WBC: 11.1 10*3/uL — AB (ref 4.0–10.5)

## 2014-10-16 LAB — RAPID URINE DRUG SCREEN, HOSP PERFORMED
Amphetamines: NOT DETECTED
BARBITURATES: NOT DETECTED
Benzodiazepines: POSITIVE — AB
Cocaine: NOT DETECTED
Opiates: POSITIVE — AB
Tetrahydrocannabinol: POSITIVE — AB

## 2014-10-16 LAB — I-STAT CG4 LACTIC ACID, ED: LACTIC ACID, VENOUS: 1.26 mmol/L (ref 0.5–2.2)

## 2014-10-16 MED ORDER — KETOROLAC TROMETHAMINE 15 MG/ML IJ SOLN
15.0000 mg | Freq: Once | INTRAMUSCULAR | Status: AC
  Administered 2014-10-16: 15 mg via INTRAVENOUS
  Filled 2014-10-16: qty 1

## 2014-10-16 MED ORDER — ONDANSETRON HCL 4 MG/2ML IJ SOLN
4.0000 mg | Freq: Four times a day (QID) | INTRAMUSCULAR | Status: DC | PRN
  Administered 2014-10-16 – 2014-10-18 (×5): 4 mg via INTRAVENOUS
  Filled 2014-10-16 (×5): qty 2

## 2014-10-16 MED ORDER — LIDOCAINE-EPINEPHRINE (PF) 1 %-1:200000 IJ SOLN
20.0000 mL | Freq: Once | INTRAMUSCULAR | Status: AC
  Administered 2014-10-16: 30 mL
  Filled 2014-10-16: qty 30

## 2014-10-16 MED ORDER — SODIUM CHLORIDE 0.9 % IV SOLN
INTRAVENOUS | Status: AC
  Administered 2014-10-16 – 2014-10-17 (×2): via INTRAVENOUS

## 2014-10-16 MED ORDER — VANCOMYCIN HCL 10 G IV SOLR
1250.0000 mg | Freq: Two times a day (BID) | INTRAVENOUS | Status: DC
  Administered 2014-10-17 – 2014-10-18 (×3): 1250 mg via INTRAVENOUS
  Filled 2014-10-16 (×4): qty 1250

## 2014-10-16 MED ORDER — VANCOMYCIN HCL 10 G IV SOLR
1500.0000 mg | INTRAVENOUS | Status: AC
  Administered 2014-10-16: 1500 mg via INTRAVENOUS
  Filled 2014-10-16: qty 1500

## 2014-10-16 MED ORDER — TETANUS-DIPHTH-ACELL PERTUSSIS 5-2.5-18.5 LF-MCG/0.5 IM SUSP
0.5000 mL | Freq: Once | INTRAMUSCULAR | Status: AC
  Administered 2014-10-16: 0.5 mL via INTRAMUSCULAR
  Filled 2014-10-16: qty 0.5

## 2014-10-16 MED ORDER — MORPHINE SULFATE 4 MG/ML IJ SOLN
4.0000 mg | Freq: Once | INTRAMUSCULAR | Status: AC
  Administered 2014-10-16: 4 mg via INTRAVENOUS
  Filled 2014-10-16: qty 1

## 2014-10-16 MED ORDER — ACETAMINOPHEN 650 MG RE SUPP: 650.0000 mg | Freq: Four times a day (QID) | RECTAL | Status: DC | PRN

## 2014-10-16 MED ORDER — FENTANYL CITRATE 0.05 MG/ML IJ SOLN
50.0000 ug | Freq: Once | INTRAMUSCULAR | Status: AC
  Administered 2014-10-16: 50 ug via INTRAVENOUS
  Filled 2014-10-16: qty 2

## 2014-10-16 MED ORDER — ONDANSETRON HCL 4 MG PO TABS: 4.0000 mg | ORAL_TABLET | Freq: Four times a day (QID) | ORAL | Status: DC | PRN

## 2014-10-16 MED ORDER — DEXTROSE 5 % IV SOLN
1.0000 g | Freq: Three times a day (TID) | INTRAVENOUS | Status: DC
  Administered 2014-10-16 – 2014-10-19 (×8): 1 g via INTRAVENOUS
  Filled 2014-10-16 (×10): qty 1

## 2014-10-16 MED ORDER — MORPHINE SULFATE 4 MG/ML IJ SOLN
4.0000 mg | Freq: Once | INTRAMUSCULAR | Status: DC
  Filled 2014-10-16: qty 1

## 2014-10-16 MED ORDER — SODIUM CHLORIDE 0.9 % IV BOLUS (SEPSIS)
1000.0000 mL | Freq: Once | INTRAVENOUS | Status: AC
  Administered 2014-10-16: 1000 mL via INTRAVENOUS

## 2014-10-16 MED ORDER — ACETAMINOPHEN 325 MG PO TABS
650.0000 mg | ORAL_TABLET | Freq: Four times a day (QID) | ORAL | Status: DC | PRN
  Administered 2014-10-17: 650 mg via ORAL
  Filled 2014-10-16: qty 2

## 2014-10-16 MED ORDER — HYDROMORPHONE HCL 1 MG/ML IJ SOLN
1.0000 mg | INTRAMUSCULAR | Status: DC | PRN
  Administered 2014-10-16 – 2014-10-19 (×12): 1 mg via INTRAVENOUS
  Filled 2014-10-16 (×12): qty 1

## 2014-10-16 NOTE — H&P (Addendum)
[facility] History and Physical  [name] YQM:[id number] DOB: 1991-06-08 DOA: 10/16/2014  Referring physician: ER physician. PCP: PROVIDER NOT IN SYSTEM  Chief Complaint: Left forearm pain and swelling.  HPI: [name] is a 23 y.o. male with history of IV drug abuse and hepatitis C present. To the ER because of worsening pain and swelling of the left forearm. Patient stated that he injected Opana 10 days ago on his forearm and gradually notices that he started elevating swelling and pain over the injected site with fever and chills. Patient also has been having some abdominal pain with nausea vomiting. Since his pain and swelling worsened and unable to move his elbow patient came to the ER. In the ER patient had x-rays done of the forearm which showed a foreign body on his antecubital fossa. ER physician had incised and drained a small abscess on his forearm. On-call orthopedic surgeon for hand surgery Dr. Orlan Leavens was consulted. Dr. Orlan Leavens is requested to keep patient on splint at this time. At this time patient has been admitted for IV antibiotics. Patient's abdomen on exam appears benign. Patient states he did inject a few months ago in his left antecubital fossa.  Review of Systems: As presented in the history of presenting illness, rest negative.  Past Medical History  Diagnosis Date  . History of Stevens-Johnson toxic epidermal necrolysis overlap syndrome     with PCN, not with keflex  . Opiate addiction     on methadone  . Hepatitis C    Past Surgical History  Procedure Laterality Date  . Tympanostomy tube placement      28-39 years old   Social History:  reports that he has been smoking.  He has never used smokeless tobacco. He reports that he uses illicit drugs (Marijuana). He reports that he does not drink alcohol. Where does patient live at home. Can patient participate in ADLs? Yes.  Allergies  Allergen Reactions  . Amoxicillin     Steven-Johnson's  Syndrome  . Ceclor [Cefaclor] Other (See Comments)    Steven-Johnson's Syndrome  . Erythromycin     Steven-Johnson's Syndrome  . Penicillins     Steven-Johnson's Syndrome  . Septra [Sulfamethoxazole-Trimethoprim]     Steven-Johnson's Syndrome    Family History:  Family History  Problem Relation Age of Onset  . Diabetes      grandparents  . Stroke Other   . Pancreatic cancer Other   . Diabetes Mellitus II Other       Prior to Admission medications   Medication Sig Start Date End Date Taking? Authorizing Provider  Aspirin-Acetaminophen-Caffeine (GOODY HEADACHE PO) Take 2 tablets by mouth 2 (two) times daily.   Yes Historical Provider, MD  ibuprofen (ADVIL,MOTRIN) 200 MG tablet Take 800 mg by mouth every 6 (six) hours as needed for headache.   Yes Historical Provider, MD  diphenhydrAMINE (BENADRYL) 25 MG tablet Take 1 tablet (25 mg total) by mouth every 6 (six) hours as needed for itching (Rash). 09/24/14   Hannah Muthersbaugh, PA-C  famotidine (PEPCID) 20 MG tablet Take 1 tablet (20 mg total) by mouth 2 (two) times daily. 09/24/14   Hannah Muthersbaugh, PA-C  predniSONE (DELTASONE) 20 MG tablet Take 2 tablets (40 mg total) by mouth daily. 09/24/14   Dierdre Forth, PA-C    Physical Exam: Filed Vitals:   10/16/14 1803 10/16/14 1823 10/16/14 2042  BP: 120/66  136/77  Pulse: 114  108  Temp: 99.1 F (37.3 C)  98.1 F (36.7 C)  TempSrc: Oral  Oral  Resp: 20  20  Height:  5\' 6"  (1.676 m)   Weight:  68.04 kg (150 lb)   SpO2: 100%  99%     General:  Moderately built and nourished.  Eyes: Anicteric no pallor.  ENT: No discharge from the ears eyes nose and mouth.  Neck: No mass felt.  Cardiovascular: S1-S2 heard.  Respiratory: No rhonchi or crepitations.  Abdomen: Soft nontender bowel sounds present.  Skin: Swelling and erythema with tenderness extending from his distal forearm on his left side up to his shoulder. Patient has difficulty flexing his elbow. He has  good sensation. Pulses are intact.  Musculoskeletal: See skin description.  Psychiatric: Appears normal.  Neurologic: Alert awake oriented to time place and person. Moves all extremities.  Labs on Admission:  Basic Metabolic Panel:  Recent Labs Lab 10/16/14 1811  NA 136*  K 3.9  CL 99  CO2 24  GLUCOSE 114*  BUN 11  CREATININE 0.71  CALCIUM 9.0   Liver Function Tests:  Recent Labs Lab 10/16/14 1811  AST 35  ALT 59*  ALKPHOS 74  BILITOT 0.4  PROT 7.3  ALBUMIN 3.5   No results for input(s): LIPASE, AMYLASE in the last 168 hours. No results for input(s): AMMONIA in the last 168 hours. CBC:  Recent Labs Lab 10/16/14 1811  WBC 11.1*  NEUTROABS 7.2  HGB 12.1*  HCT 36.0*  MCV 87.0  PLT 189   Cardiac Enzymes: No results for input(s): CKTOTAL, CKMB, CKMBINDEX, TROPONINI in the last 168 hours.  BNP (last 3 results) No results for input(s): PROBNP in the last 8760 hours. CBG: No results for input(s): GLUCAP in the last 168 hours.  Radiological Exams on Admission: Dg Forearm Left  10/16/2014   CLINICAL DATA:  Cellulitis; intravenous drug user; pt has redness, swelling, and pain-- pt states he used intravenous drugs 1.5 weeks ago and part of a needle may have broken off in his arm  EXAM: LEFT FOREARM - 2 VIEW  COMPARISON:  Humerus films of same date  FINDINGS: No acute fracture or dislocation. Soft tissue swelling about the proximal forearm. Metallic object which projects medial to the the antecubital fossa and measures 8 mm.  IMPRESSION: Radiopaque foreign object projecting about the medial aspect of the antecubital fossa. Presumably a needle fragment. Soft tissue swelling without osseous abnormality.   Electronically Signed   By: Jeronimo Greaves M.D.   On: 10/16/2014 20:02   Dg Humerus Left  10/16/2014   CLINICAL DATA:  Cellulitis, intravenous drug use with redness and swelling in the upper arm  EXAM: LEFT HUMERUS - 2+ VIEW  COMPARISON:  None.  FINDINGS: No acute  fracture or dislocation is noted. Generalized soft tissue swelling is noted conscious with a given clinical history. There is a small 7 mm radiopaque foreign body in the medial aspect of the antecubital fossa consistent with the given clinical history of broken needle. No other foreign bodies are noted. No other focal abnormality is seen.  IMPRESSION: Generalized soft tissue swelling with evidence of foreign body in the medial aspect of the antecubital fossa as described.   Electronically Signed   By: Alcide Clever M.D.   On: 10/16/2014 20:04     Assessment/Plan Principal Problem:   Cellulitis of forearm, left Active Problems:   IV drug abuse   1. Cellulitis and abscess of the left forearm with early sepsis with foreign body - patient has been placed on empiric antibiotics vancomycin and Azactam  after blood cultures were obtained. Patient has had incision and drainage done in the ER. I have discussed with Dr. Orlan Leavens, on call and surgeon. At this time Dr. Orlan Leavens has requested to keep patient on splint and get wound team consult since patient already has had incision and drainage. Keep patient left arm elevated. Primary concern is for development of compartment syndrome. I have kept Patient nothing by mouth if in case patient will need further surgical procedure. Closely observe. Tetanus toxoid has been ordered. 2. IV drug abuse - patient strongly advised again that habit. Social work consult. 3. Anemia, normocytic normochromic - follow CBC and if there is no further worsening then further workup as outpatient. 4. Mildly elevated ALT - patient has history of hepatitis C per the charts. Closely follow hepatic panel.    Code Status: Full code.  Family Communication: Patient's mother at the bedside.  Disposition Plan: Admit to inpatient.    Camiah Humm N. [facility] Pager [phone].  If 7PM-7AM, please contact night-coverage [email or web address] Password [email or web address] 10/16/2014, 9:04  PM

## 2014-10-16 NOTE — ED Provider Notes (Signed)
Patient reports he started getting progressively worsening swelling and redness of his left upper extremity over the past one and half weeks. He states it's very tight and hard to flex his arm. He initially stated he thought it was from using a torque tool at work however he also admits to IV drug abuse. He states they were injecting Opana.  Patient has marked swelling and redness of his left forearm with an area that appears to be an abscess of the proximal  Lateral forearm.    Medical screening examination/treatment/procedure(s) were conducted as a shared visit with non-physician practitioner(s) and myself.  I personally evaluated the patient during the encounter.   EKG Interpretation None       Rolland Porter, MD, Abram Sander   Janice Norrie, MD 10/16/14 2031

## 2014-10-16 NOTE — Progress Notes (Addendum)
ANTIBIOTIC CONSULT NOTE - INITIAL  Pharmacy Consult for Vancomycin Indication: Cellulitis  Allergies  Allergen Reactions  . Amoxicillin     Steven-Johnson's Syndrome  . Ceclor [Cefaclor] Other (See Comments)    Steven-Johnson's Syndrome  . Erythromycin     Steven-Johnson's Syndrome  . Penicillins     Steven-Johnson's Syndrome  . Septra [Sulfamethoxazole-Trimethoprim]     Steven-Johnson's Syndrome    Patient Measurements: Height: 5\' 6"  (167.6 cm) Weight: 150 lb (68.04 kg) IBW/kg (Calculated) : 63.8  Vital Signs: Temp: 99.1 F (37.3 C) (11/18 1803) Temp Source: Oral (11/18 1803) BP: 120/66 mmHg (11/18 1803) Pulse Rate: 114 (11/18 1803) Intake/Output from previous day:   Intake/Output from this shift:    Labs:  Recent Labs  10/16/14 1811  WBC 11.1*  HGB 12.1*  PLT 189  CREATININE 0.71   Estimated Creatinine Clearance: 128.5 mL/min (by C-G formula based on Cr of 0.71). No results for input(s): VANCOTROUGH, VANCOPEAK, VANCORANDOM, GENTTROUGH, GENTPEAK, GENTRANDOM, TOBRATROUGH, TOBRAPEAK, TOBRARND, AMIKACINPEAK, AMIKACINTROU, AMIKACIN in the last 72 hours.   Microbiology: No results found for this or any previous visit (from the past 720 hour(s)).  Medical History: Past Medical History  Diagnosis Date  . History of Stevens-Johnson toxic epidermal necrolysis overlap syndrome     with PCN, not with keflex  . Opiate addiction     on methadone  . Hepatitis C     Medications:  Scheduled:   Infusions:  . vancomycin 1,500 mg (10/16/14 1859)   PRN:   Assessment: 23 yo male with opiate addiction, Hep C and hx SJS to multiple abx presents to ED with left arm redness, swelling and pain worsening over past week s/p IV drug use.  11/18 >> Vancomycin >>  Tmax: 99.1 WBC: 11.1k Renal: SCr 0.71, CrCl > 100 ml/min  11/18 blood: 11/18 wound:  Goal of Therapy:  Vancomycin trough level 10-15 mcg/ml  Plan:   Vancomycin 1500mg  IV x 1, then 1250mg  IV q12h Check  trough at steady state Follow up renal function & cultures  Peggyann Juba, PharmD, BCPS Pager: [phone] 10/16/2014,7:22 PM   Addendum: Pharmacy is now consulted to dose aztreonam. Based on normal renal function, begin 1g IV q8h.  Peggyann Juba, PharmD, BCPS 10/16/2014 9:36 PM

## 2014-10-16 NOTE — ED Provider Notes (Signed)
CSN: [id number]     Arrival date & time 10/16/14  1756 History   First MD Initiated Contact with Patient 10/16/14 1800     Chief Complaint  Patient presents with  . Cellulitis     (Consider location/radiation/quality/duration/timing/severity/associated sxs/prior Treatment) HPI  [name] is a 23 y.o. male  With past medical history significant for opiate addiction, hepatitis C, IV drug use complaining of pain and swelling to left arm worsening over the course of 10 days, when patient states he injected outside of the vein.. Patient states that he injected opana which he "cooked" on a stove, does not think that any of the needle broke off in the arm, states that a fragment broke off on the ulnar side of the left arm in the remote past. Denies cervicalgia, chest pain, shortness of breath. He reports chills, denies fever, nausea vomiting. Pain is severe, 10 out of 10 and exacerbated by movement and palpation.  Past Medical History  Diagnosis Date  . History of Stevens-Johnson toxic epidermal necrolysis overlap syndrome     with PCN, not with keflex  . Opiate addiction     on methadone  . Hepatitis C    Past Surgical History  Procedure Laterality Date  . Tympanostomy tube placement      52-42 years old   Family History  Problem Relation Age of Onset  . Diabetes      grandparents   History  Substance Use Topics  . Smoking status: Current Every Day Smoker -- 0.50 packs/day for 5 years  . Smokeless tobacco: Never Used  . Alcohol Use: No    Review of Systems  10 systems reviewed and found to be negative, except as noted in the HPI.   Allergies  Amoxicillin; Ceclor; Erythromycin; Penicillins; and Septra  Home Medications   Prior to Admission medications   Medication Sig Start Date End Date Taking? Authorizing Provider  Aspirin-Acetaminophen-Caffeine (GOODY HEADACHE PO) Take 2 tablets by mouth 2 (two) times daily.   Yes Historical Provider, MD  ibuprofen (ADVIL,MOTRIN)  200 MG tablet Take 800 mg by mouth every 6 (six) hours as needed for headache.   Yes Historical Provider, MD  diphenhydrAMINE (BENADRYL) 25 MG tablet Take 1 tablet (25 mg total) by mouth every 6 (six) hours as needed for itching (Rash). 09/24/14   Hannah Muthersbaugh, PA-C  famotidine (PEPCID) 20 MG tablet Take 1 tablet (20 mg total) by mouth 2 (two) times daily. 09/24/14   Hannah Muthersbaugh, PA-C  predniSONE (DELTASONE) 20 MG tablet Take 2 tablets (40 mg total) by mouth daily. 09/24/14   Hannah Muthersbaugh, PA-C   BP 120/66 mmHg  Pulse 114  Temp(Src) 99.1 F (37.3 C) (Oral)  Resp 20  Ht 5\' 6"  (1.676 m)  Wt 150 lb (68.04 kg)  BMI 24.22 kg/m2  SpO2 100% Physical Exam  Constitutional: He is oriented to person, place, and time. He appears well-developed and well-nourished. No distress.  HENT:  Head: Normocephalic.  Eyes: Conjunctivae and EOM are normal.  Cardiovascular: Normal rate.   Pulmonary/Chest: Effort normal. No stridor.  Musculoskeletal: Normal range of motion.       Arms: Neurological: He is alert and oriented to person, place, and time.  Skin:  Large area of cellulitis from wrist to axilla of the left arm. There is a large abscess on the radial forearm. Radial pulses intact, excellent range of motion to the hand.  Psychiatric: He has a normal mood and affect.  Nursing note and vitals reviewed.  ED Course  INCISION AND DRAINAGE Date/Time: 10/16/2014 7:44 PM Performed by: Monico Blitz Authorized by: Monico Blitz Consent: Verbal consent obtained. Consent given by: patient Patient identity confirmed: verbally with patient and hospital-assigned identification number Type: abscess Body area: upper extremity Location details: left arm Anesthesia: local infiltration Local anesthetic: lidocaine 1% with epinephrine Anesthetic total: 5 ml Risk factor: underlying major vessel Scalpel size: 11 Incision type: elliptical Complexity: complex Drainage:  purulent Drainage amount: copious Wound treatment: wound left open Packing material: 1/2 in gauze Patient tolerance: Patient tolerated the procedure well with no immediate complications    SPLINT APPLICATION Date/Time: 30/07/2329 8:38 PM Performed by: Monico Blitz Authorized by: Monico Blitz Consent: Verbal consent obtained. Consent given by: patient Patient identity confirmed: verbally with patient Location details: left arm Splint type: long arm Supplies used: cotton padding Post-procedure: The splinted body part was neurovascularly unchanged following the procedure. Patient tolerance: Patient tolerated the procedure well with no immediate complications     Labs Review Labs Reviewed  CBC WITH DIFFERENTIAL - Abnormal; Notable for the following:    WBC 11.1 (*)    RBC 4.14 (*)    Hemoglobin 12.1 (*)    HCT 36.0 (*)    All other components within normal limits  COMPREHENSIVE METABOLIC PANEL - Abnormal; Notable for the following:    Sodium 136 (*)    Glucose, Bld 114 (*)    ALT 59 (*)    All other components within normal limits  URINE RAPID DRUG SCREEN (HOSP PERFORMED) - Abnormal; Notable for the following:    Opiates POSITIVE (*)    Benzodiazepines POSITIVE (*)    Tetrahydrocannabinol POSITIVE (*)    All other components within normal limits  CULTURE, BLOOD (ROUTINE X 2)  CULTURE, BLOOD (ROUTINE X 2)  WOUND CULTURE  I-STAT CG4 LACTIC ACID, ED    Imaging Review Dg Forearm Left  10/16/2014   CLINICAL DATA:  Cellulitis; intravenous drug user; pt has redness, swelling, and pain-- pt states he used intravenous drugs 1.5 weeks ago and part of a needle may have broken off in his arm  EXAM: LEFT FOREARM - 2 VIEW  COMPARISON:  Humerus films of same date  FINDINGS: No acute fracture or dislocation. Soft tissue swelling about the proximal forearm. Metallic object which projects medial to the the antecubital fossa and measures 8 mm.  IMPRESSION: Radiopaque foreign  object projecting about the medial aspect of the antecubital fossa. Presumably a needle fragment. Soft tissue swelling without osseous abnormality.   Electronically Signed   By: Abigail Miyamoto M.D.   On: 10/16/2014 20:02   Dg Humerus Left  10/16/2014   CLINICAL DATA:  Cellulitis, intravenous drug use with redness and swelling in the upper arm  EXAM: LEFT HUMERUS - 2+ VIEW  COMPARISON:  None.  FINDINGS: No acute fracture or dislocation is noted. Generalized soft tissue swelling is noted conscious with a given clinical history. There is a small 7 mm radiopaque foreign body in the medial aspect of the antecubital fossa consistent with the given clinical history of broken needle. No other foreign bodies are noted. No other focal abnormality is seen.  IMPRESSION: Generalized soft tissue swelling with evidence of foreign body in the medial aspect of the antecubital fossa as described.   Electronically Signed   By: Inez Catalina M.D.   On: 10/16/2014 20:04     EKG Interpretation None      MDM   Final diagnoses:  Cellulitis  IVDU (intravenous drug user)  Foreign body of upper arm, left, subsequent encounter    Filed Vitals:   10/16/14 1803 10/16/14 1823  BP: 120/66   Pulse: 114   Temp: 99.1 F (37.3 C)   TempSrc: Oral   Resp: 20   Height:  5\' 6"  (1.676 m)  Weight:  150 lb (68.04 kg)  SpO2: 100%     Medications  vancomycin (VANCOCIN) 1,500 mg in sodium chloride 0.9 % 500 mL IVPB (1,500 mg Intravenous New Bag/Given 10/16/14 1859)  vancomycin (VANCOCIN) 1,250 mg in sodium chloride 0.9 % 250 mL IVPB (not administered)  sodium chloride 0.9 % bolus 1,000 mL (1,000 mLs Intravenous New Bag/Given 10/16/14 1830)  morphine 4 MG/ML injection 4 mg (4 mg Intravenous Given 10/16/14 1831)  ketorolac (TORADOL) 15 MG/ML injection 15 mg (15 mg Intravenous Given 10/16/14 1910)  fentaNYL (SUBLIMAZE) injection 50 mcg (50 mcg Intravenous Given 10/16/14 1908)  lidocaine-EPINEPHrine (XYLOCAINE-EPINEPHrine) 1  %-1:200000 (PF) injection 20 mL (30 mLs Other Given 10/16/14 1917)    [name] is a 23 y.o. male presenting with abscess and cellulitis to left (nondominant hand) this is likely secondary to IV drug use. Patient states that he thinks he injected outside the vein before this happened. I and D performed with copious amounts of purulent drainage. Wound culture and blood culture sent. Will cover with vancomycin. Patient will need admission for IV antibiotics. Discussed case with Dr. Hal Hope who accepts admission. Requests hand consults. Or through hand call consult from Dr. Apolonio Schneiders appreciated: Agrees with incision and drainage and antibiotic choice will consult on the patient on the floor. Recommends patient be placed in a long-arm splint.  X-rays do show foreign body consistent with needle. Patient is aware that he has a fragment of the needle in the arm at the location as seen on x-ray. This is not new. This is not the nidus of the infection at this point.  This is a shared visit with the attending physician who personally evaluated the patient and agrees with the care plan.     Monico Blitz, PA-C 10/16/14 2043  Janice Norrie, MD 10/17/14 [phone]

## 2014-10-16 NOTE — ED Notes (Signed)
Pt reports left arm redness, swelling, and pain worsening over pain week and a half post "drug use."

## 2014-10-16 NOTE — Progress Notes (Signed)
  CARE MANAGEMENT ED NOTE 10/16/2014  Patient:  [name], [name]   Account Number:  [id number]  Date Initiated:  10/16/2014  Documentation initiated by:  Livia Snellen  Subjective/Objective Assessment:   Patient presents to Ed with abcess/cellulitis to left forearm     Subjective/Objective Assessment Detail:   Patient reorts having injected himself with drug Opana that he "cooked" on the stove.  Patient with history of substance abuse     Action/Plan:   Consult to hand surgery   Action/Plan Detail:   Anticipated DC Date:       Status Recommendation to Physician:   Result of Recommendation:    Other ED Services  Consult Working [place]  CM consult  Medication Assistance  Other    Choice offered to / List presented to:            Status of service:  Completed, signed off  ED Comments:   ED Comments Detail:  EDCM spoke to patient and his mother at bedside.  Patient listed as living in [place] without insurnace. Patient reports his pcp is Dr. Lorine Bears of [place] updated.  Patient reports he tried to enroll for Medicaid, but did not qualify.  [facility] informed patient to get prescritptions filled at [facility], [facility], [place] [facility] or [employer] as they have discounted pharmacy prices.  EDCM also provided patient with phone number to inquire about Affordable Care Act.  Patient reports he is currently employed and is not on any medications.  Patient reports, "This is my own fault.  I relapsed once and this it what happens." Patient qualifies for [facility] program. [facility] offered patient support.  No further EDCM needs at this time.

## 2014-10-17 DIAGNOSIS — B1920 Unspecified viral hepatitis C without hepatic coma: Secondary | ICD-10-CM | POA: Diagnosis present

## 2014-10-17 DIAGNOSIS — S40852A Superficial foreign body of left upper arm, initial encounter: Secondary | ICD-10-CM | POA: Diagnosis present

## 2014-10-17 DIAGNOSIS — Z720 Tobacco use: Secondary | ICD-10-CM | POA: Diagnosis present

## 2014-10-17 DIAGNOSIS — B182 Chronic viral hepatitis C: Secondary | ICD-10-CM

## 2014-10-17 DIAGNOSIS — S40852D Superficial foreign body of left upper arm, subsequent encounter: Secondary | ICD-10-CM

## 2014-10-17 LAB — URINALYSIS, ROUTINE W REFLEX MICROSCOPIC
Bilirubin Urine: NEGATIVE
Glucose, UA: NEGATIVE mg/dL
Hgb urine dipstick: NEGATIVE
KETONES UR: NEGATIVE mg/dL
LEUKOCYTES UA: NEGATIVE
Nitrite: NEGATIVE
Protein, ur: NEGATIVE mg/dL
Specific Gravity, Urine: 1.023 (ref 1.005–1.030)
UROBILINOGEN UA: 4 mg/dL — AB (ref 0.0–1.0)
pH: 7.5 (ref 5.0–8.0)

## 2014-10-17 LAB — CBC WITH DIFFERENTIAL/PLATELET
Basophils Absolute: 0 10*3/uL (ref 0.0–0.1)
Basophils Relative: 0 % (ref 0–1)
Eosinophils Absolute: 0.2 10*3/uL (ref 0.0–0.7)
Eosinophils Relative: 2 % (ref 0–5)
HCT: 32.7 % — ABNORMAL LOW (ref 39.0–52.0)
Hemoglobin: 11 g/dL — ABNORMAL LOW (ref 13.0–17.0)
Lymphocytes Relative: 30 % (ref 12–46)
Lymphs Abs: 2.3 10*3/uL (ref 0.7–4.0)
MCH: 29.1 pg (ref 26.0–34.0)
MCHC: 33.6 g/dL (ref 30.0–36.0)
MCV: 86.5 fL (ref 78.0–100.0)
Monocytes Absolute: 0.6 10*3/uL (ref 0.1–1.0)
Monocytes Relative: 8 % (ref 3–12)
Neutro Abs: 4.5 10*3/uL (ref 1.7–7.7)
Neutrophils Relative %: 60 % (ref 43–77)
Platelets: 218 10*3/uL (ref 150–400)
RBC: 3.78 MIL/uL — ABNORMAL LOW (ref 4.22–5.81)
RDW: 12.8 % (ref 11.5–15.5)
WBC: 7.6 10*3/uL (ref 4.0–10.5)

## 2014-10-17 LAB — COMPREHENSIVE METABOLIC PANEL
ALK PHOS: 66 U/L (ref 39–117)
ALT: 45 U/L (ref 0–53)
ANION GAP: 8 (ref 5–15)
AST: 25 U/L (ref 0–37)
Albumin: 2.9 g/dL — ABNORMAL LOW (ref 3.5–5.2)
BILIRUBIN TOTAL: 0.6 mg/dL (ref 0.3–1.2)
BUN: 9 mg/dL (ref 6–23)
CHLORIDE: 102 meq/L (ref 96–112)
CO2: 25 meq/L (ref 19–32)
Calcium: 8.3 mg/dL — ABNORMAL LOW (ref 8.4–10.5)
Creatinine, Ser: 0.65 mg/dL (ref 0.50–1.35)
GFR calc Af Amer: >90 mL/min (ref >90)
Glucose, Bld: 105 mg/dL — ABNORMAL HIGH (ref 70–99)
Potassium: 3.8 mEq/L (ref 3.7–5.3)
Sodium: 135 mEq/L — ABNORMAL LOW (ref 137–147)
Total Protein: 6.1 g/dL (ref 6.0–8.3)

## 2014-10-17 LAB — GLUCOSE, CAPILLARY
GLUCOSE-CAPILLARY: 100 mg/dL — AB (ref 70–99)
GLUCOSE-CAPILLARY: 122 mg/dL — AB (ref 70–99)
GLUCOSE-CAPILLARY: 150 mg/dL — AB (ref 70–99)
Glucose-Capillary: 111 mg/dL — ABNORMAL HIGH (ref 70–99)

## 2014-10-17 MED ORDER — NICOTINE 21 MG/24HR TD PT24
21.0000 mg | MEDICATED_PATCH | Freq: Every day | TRANSDERMAL | Status: DC
  Administered 2014-10-17 – 2014-10-19 (×3): 21 mg via TRANSDERMAL
  Filled 2014-10-17 (×3): qty 1

## 2014-10-17 MED ORDER — ZOLPIDEM TARTRATE 5 MG PO TABS
5.0000 mg | ORAL_TABLET | Freq: Once | ORAL | Status: AC
  Administered 2014-10-17: 5 mg via ORAL
  Filled 2014-10-17: qty 1

## 2014-10-17 NOTE — Progress Notes (Signed)
Clinical Social Work Department BRIEF PSYCHOSOCIAL ASSESSMENT 10/17/2014  Patient:  [name], [name]     Account Number:  [id number]     Admit date:  10/16/2014  Clinical Social Worker:  Earlie Server  Date/Time:  10/17/2014 02:30 PM  Referred by:  Physician  Date Referred:  10/17/2014 Referred for  Substance Abuse   Other Referral:   Interview type:  Patient Other interview type:    PSYCHOSOCIAL DATA Living Status:  FAMILY Admitted from facility:   Level of care:   Primary support name:  [name] Primary support relationship to patient:  PARENT Degree of support available:   Strong    CURRENT CONCERNS Current Concerns  Substance Abuse   Other Concerns:    SOCIAL WORK ASSESSMENT / PLAN CSW received referral in order to complete substance abuse assessment. CSW reviewed chart and met with patient and mother at bedside. Patient reports mother is very involved with his life and agreeable for her involvement during assessment.    Patient reports he works [employer] so he travels often. Patient reports he is usually at one city for couple of weeks and then will come home to visit family. Patient usually stays with grandmother when he is back in town and reports he has a 22 year old son [name]) that he visits as well. Mom reports that patient was living with her but she kicked him out after he began stealing from her.    Patient reports he was admitted after injecting Opana. Patient reports he started using pain medication around the age of 59 or 37 when he had an injury during football practice and was prescribed pain medications. Mom reports that patient's biological father was an addict and that patient quickly began using substances as well. Mom and biological father divorced and dad gave up all parental rights when patient was 70 years old. Mom has remarried and patient reports good relationship with stepfather. Patient reports mom provides tough love but understands  why she wants him to be sober. Patient reports long periods of sobriety and denies that any substance use is related to emotional connections. Patient reports he has been to detox and [facility] twice due to accidental overdoses as a teenager. Patient reports that he was sober for 10 months prior to using a couple of weeks ago. Patient reports he gets a high from injecting Opana and that he knows he should not have relapsed. Patient reports that he does not feel addicted but mom reports concern about patient increasing his use about two years ago when he started having custody issues with his son. Patient reports he has considering going to NA meetings but is worried about his schedule. 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 Patient reports he will consider options but does not want any local resources at this time.    Patient reports he violated probation by leaving the county and is supposed to have court tomorrow. CSW provided patient with letter to give to lawyer for court hearing. CSW will continue to follow.   Assessment/plan status:  Psychosocial Support/Ongoing Assessment of Needs Other assessment/ plan:   SBIRT   Information/referral to community resources:   Substance abuse resources  Letter for court    PATIENT'S/FAMILY'S RESPONSE TO PLAN OF CARE: Patient alert and oriented. Patient open when discussing substance abuse but does not seem to fully understand  his emotional ties to substance use. Patient reports that he loves mom for her support and understands her concern when he uses. Patient reports he is concerned about being treated differently due to substance use but CSW reassured patient that hospital is concerned about patient's safety. Patient open to CSW to continue to follow to provide support throughout hospital stay.       [place], [place] [phone]

## 2014-10-17 NOTE — Progress Notes (Signed)
PROGRESS NOTE  [name] XLK:[id number] DOB: 1990-12-01 DOA: 10/16/2014 PCP: PROVIDER NOT IN SYSTEM  HPI/Recap of past 67 hours: 23 year old male with past mental history of hepatitis C and IV drug abuse admitted on 11/19 for a left arm cellulitis secondary to a broken needle in the left antecubital fossa which occurred 10 days prior when patient attempted to inject homemade opiates into that arm.  Patient started on IV aztreonam and vancomycin (has previous history of Stevens-Johnson syndrome from other antibiotics ) and wound care saw patient with recommendations started. Case discussed emergency room with hand surgery who will be coming by later to see the patient today. Patient himself complains of being hungry once to eat. Has some soreness in the left arm.   Assessment/Plan: Principal Problem:   Cellulitis of forearm, left secondary to acute foreign body of left upper arm: Continue IV antibiotics, wound care recommendations and awaiting follow-up from hand surgery Active Problems:   IV drug abuse: Patient counseled. Given young age and long history, he needs aggressive outpatient therapy   Hepatitis C: We'll check viral load. Do not think patient has close follow-up for this.  Tobacco abuse: On nicotine patch   Code Status: Full code   Family Communication: Mom at the bedside   Disposition Plan: Depending on orthopedic recommendations   Consultants:   Hand surgery   Procedures:   None   Antibiotics:   IV aztreonam 11/18-present   IV vancomycin 11/18-present    Objective: BP 121/64 mmHg  Pulse 84  Temp(Src) 98.3 F (36.8 C) (Oral)  Resp 18  Ht 5\' 6"  (1.676 m)  Wt 68.04 kg (150 lb)  BMI 24.22 kg/m2  SpO2 100%  Intake/Output Summary (Last 24 hours) at 10/17/14 1343 Last data filed at 10/17/14 0543  Gross per 24 hour  Intake      0 ml  Output    650 ml  Net   -650 ml   Filed Weights   10/16/14 1823 10/16/14 2133  Weight: 68.04 kg (150 lb) 68.04 kg  (150 lb)    Exam:   General:  Alert and oriented 3, withdrawn  Cardiovascular: Regular rate and rhythm, S1-S2   Respiratory:  Clear to auscultation bilaterally  Abdomen:  Soft, nontender, nondistended, positive bowel sounds   Musculoskeletal:  Left upper extremity wrapped with some swelling, mild tenderness in sling  Data Reviewed: Basic Metabolic Panel:  Recent Labs Lab 10/16/14 1811 10/17/14 0507  NA 136* 135*  K 3.9 3.8  CL 99 102  CO2 24 25  GLUCOSE 114* 105*  BUN 11 9  CREATININE 0.71 0.65  CALCIUM 9.0 8.3*   Liver Function Tests:  Recent Labs Lab 10/16/14 1811 10/17/14 0507  AST 35 25  ALT 59* 45  ALKPHOS 74 66  BILITOT 0.4 0.6  PROT 7.3 6.1  ALBUMIN 3.5 2.9*   No results for input(s): LIPASE, AMYLASE in the last 168 hours. No results for input(s): AMMONIA in the last 168 hours. CBC:  Recent Labs Lab 10/16/14 1811 10/17/14 0507  WBC 11.1* 7.6  NEUTROABS 7.2 4.5  HGB 12.1* 11.0*  HCT 36.0* 32.7*  MCV 87.0 86.5  PLT 189 218   Cardiac Enzymes:   No results for input(s): CKTOTAL, CKMB, CKMBINDEX, TROPONINI in the last 168 hours. BNP (last 3 results) No results for input(s): PROBNP in the last 8760 hours. CBG:  Recent Labs Lab 10/17/14 0039  GLUCAP 100*    Recent Results (from the past 240 hour(s))  Culture,  blood (routine x 2)     Status: None (Preliminary result)   Collection Time: 10/16/14  6:12 PM  Result Value Ref Range Status   Specimen Description BLOOD RIGHT ANTECUBITAL  Final   Special Requests BOTTLES DRAWN AEROBIC ONLY 2 ML  Final   Culture  Setup Time   Final    10/16/2014 22:40 Performed at [facility]    Culture   Final           BLOOD CULTURE RECEIVED NO GROWTH TO DATE CULTURE WILL BE HELD FOR 5 DAYS BEFORE ISSUING A FINAL NEGATIVE REPORT Performed at [facility]    Report Status PENDING  Incomplete  Culture, blood (routine x 2)     Status: None (Preliminary result)   Collection Time: 10/16/14   6:27 PM  Result Value Ref Range Status   Specimen Description BLOOD RIGHT HAND  Final   Special Requests BOTTLES DRAWN AEROBIC AND ANAEROBIC 4 ML  Final   Culture  Setup Time   Final    10/16/2014 22:40 Performed at [facility]    Culture   Final           BLOOD CULTURE RECEIVED NO GROWTH TO DATE CULTURE WILL BE HELD FOR 5 DAYS BEFORE ISSUING A FINAL NEGATIVE REPORT Performed at [facility]    Report Status PENDING  Incomplete  Wound culture     Status: None (Preliminary result)   Collection Time: 10/16/14  7:29 PM  Result Value Ref Range Status   Specimen Description ABSCESS  Final   Special Requests NONE  Final   Gram Stain   Final    RARE WBC PRESENT,BOTH PMN AND MONONUCLEAR NO SQUAMOUS EPITHELIAL CELLS SEEN RARE GRAM POSITIVE COCCI IN PAIRS Performed at [facility]    Culture NO GROWTH Performed at [facility]   Final   Report Status PENDING  Incomplete     Studies: Dg Forearm Left  10/16/2014   CLINICAL DATA:  Cellulitis; intravenous drug user; pt has redness, swelling, and pain-- pt states he used intravenous drugs 1.5 weeks ago and part of a needle may have broken off in his arm  EXAM: LEFT FOREARM - 2 VIEW  COMPARISON:  Humerus films of same date  FINDINGS: No acute fracture or dislocation. Soft tissue swelling about the proximal forearm. Metallic object which projects medial to the the antecubital fossa and measures 8 mm.  IMPRESSION: Radiopaque foreign object projecting about the medial aspect of the antecubital fossa. Presumably a needle fragment. Soft tissue swelling without osseous abnormality.   Electronically Signed   By: Abigail Miyamoto M.D.   On: 10/16/2014 20:02   Dg Humerus Left  10/16/2014   CLINICAL DATA:  Cellulitis, intravenous drug use with redness and swelling in the upper arm  EXAM: LEFT HUMERUS - 2+ VIEW  COMPARISON:  None.  FINDINGS: No acute fracture or dislocation is noted. Generalized soft tissue swelling is noted  conscious with a given clinical history. There is a small 7 mm radiopaque foreign body in the medial aspect of the antecubital fossa consistent with the given clinical history of broken needle. No other foreign bodies are noted. No other focal abnormality is seen.  IMPRESSION: Generalized soft tissue swelling with evidence of foreign body in the medial aspect of the antecubital fossa as described.   Electronically Signed   By: Inez Catalina M.D.   On: 10/16/2014 20:04    Scheduled Meds: . aztreonam  1 g Intravenous  3 times per day  . nicotine  21 mg Transdermal Daily  . vancomycin  1,250 mg Intravenous Q12H    Continuous Infusions: . sodium chloride 125 mL/hr at 10/16/14 2141     Time spent: 25 minutes  [facility] Pager [phone]. If 7PM-7AM, please contact night-coverage at [email or web address], password [facility] 10/17/2014, 1:43 PM  LOS: 1 day

## 2014-10-17 NOTE — Consult Note (Signed)
CHART REVIEWED PT SEEN/EXAMINED PT WITH OPEN WOUND OVER LEFT FOREARM LOOKS GOOD WITH OPEN DRAINING WOUND NO ABSCESS COLLECTION WOUND REPACKED TODAY PT NEEDS DAILY DRESSING AND PACKING CHANGES AND WOUND CARE AS AN OUTPATIENT AND WHEN THE WOUND IS NOT BEING CHANGED HE NEEDS TO KEEP THE SPLINT ON IV ABX PER PRIMARY TEAM BASED ON CULTURES NO SURGERY RECOMMENDED HE HAS BEEN THROUGH THIS TYPE OF TREATMENT BEFORE AND CAN F/U WITH PCP OR WOUND CARE CLINIC.

## 2014-10-17 NOTE — Consult Note (Signed)
WOC wound consult note Reason for Consult: Patient is s/p I&D of left forearm abscess.  Mother is with patient, states "he still has a syringe or needle in his arm". Wants to know if/when he will be having surgery. Wound type: Infectious Pressure Ulcer POA: No Measurement:2cm x 1cm x 2cm Wound GGE:ZMOQHU to view except for exterior, surface tissue which is red, friable. Drainage (amount, consistency, odor) large amount of purulent. Serosanguinous exudate expressed from wound.No odor. Periwound: Erythematous, indurated, edematous Dressing procedure/placement/frequency: Old packing strip removed, it appears that approximately 10cm (4 inches) was packed into defect.  Wound is vigorously cleansed and irrigated with NS and additional exudate is expressed (approximately 71ml).  Iodoform gauze packing strip is ordered to fill defect twice daily and arm wrapped and placed in splint to immobilize.  Immoblilizer/splint is secured using ACE wraps applied from hand to upper arm.  Arm is to be elevated.  Suggest further consultation with surgery (hand or CCS) to determine if procedure to remove foreign object is needed/scheduled. I have nothing to offer aside from the conservative topical therapy implemented today and will defer to the MD should they desire an alternate therapy.  If you agree, please reconsult. [place] nursing team will not follow, but will remain available to this patient, the nursing and medical team.  Please re-consult if needed. Thanks, Maudie Flakes, MSN, RN, [place], [place], [place] [phone])

## 2014-10-18 LAB — CBC
HCT: 31.8 % — ABNORMAL LOW (ref 39.0–52.0)
HEMOGLOBIN: 10.8 g/dL — AB (ref 13.0–17.0)
MCH: 29 pg (ref 26.0–34.0)
MCHC: 34 g/dL (ref 30.0–36.0)
MCV: 85.3 fL (ref 78.0–100.0)
Platelets: 210 10*3/uL (ref 150–400)
RBC: 3.73 MIL/uL — AB (ref 4.22–5.81)
RDW: 12.5 % (ref 11.5–15.5)
WBC: 5 10*3/uL (ref 4.0–10.5)

## 2014-10-18 LAB — VANCOMYCIN, TROUGH: Vancomycin Tr: <5 ug/mL — ABNORMAL LOW (ref 10.0–20.0)

## 2014-10-18 LAB — GLUCOSE, CAPILLARY
GLUCOSE-CAPILLARY: 115 mg/dL — AB (ref 70–99)
Glucose-Capillary: 121 mg/dL — ABNORMAL HIGH (ref 70–99)
Glucose-Capillary: 177 mg/dL — ABNORMAL HIGH (ref 70–99)

## 2014-10-18 LAB — BASIC METABOLIC PANEL
ANION GAP: 11 (ref 5–15)
BUN: 10 mg/dL (ref 6–23)
CHLORIDE: 103 meq/L (ref 96–112)
CO2: 26 mEq/L (ref 19–32)
Calcium: 8.9 mg/dL (ref 8.4–10.5)
Creatinine, Ser: 0.59 mg/dL (ref 0.50–1.35)
GFR calc non Af Amer: >90 mL/min (ref >90)
Glucose, Bld: 111 mg/dL — ABNORMAL HIGH (ref 70–99)
POTASSIUM: 3.8 meq/L (ref 3.7–5.3)
SODIUM: 140 meq/L (ref 137–147)

## 2014-10-18 LAB — HCV RNA QUANT
HCV Quantitative Log: 3.96 {Log} — ABNORMAL HIGH (ref <1.18)
HCV Quantitative: 9114 IU/mL — ABNORMAL HIGH (ref <15)

## 2014-10-18 LAB — URINE CULTURE
Colony Count: NO GROWTH
Culture: NO GROWTH

## 2014-10-18 MED ORDER — DIPHENHYDRAMINE HCL 50 MG PO CAPS
50.0000 mg | ORAL_CAPSULE | Freq: Once | ORAL | Status: AC
  Administered 2014-10-18: 50 mg via ORAL
  Filled 2014-10-18: qty 2

## 2014-10-18 MED ORDER — VANCOMYCIN HCL 10 G IV SOLR
1500.0000 mg | Freq: Two times a day (BID) | INTRAVENOUS | Status: DC
  Administered 2014-10-18 – 2014-10-19 (×2): 1500 mg via INTRAVENOUS
  Filled 2014-10-18 (×2): qty 1500

## 2014-10-18 NOTE — Progress Notes (Signed)
ANTIBIOTIC CONSULT NOTE - follow up  Pharmacy Consult for Vancomycin Indication: Cellulitis  Allergies  Allergen Reactions  . Amoxicillin     Steven-Johnson's Syndrome  . Ceclor [Cefaclor] Other (See Comments)    Steven-Johnson's Syndrome  . Erythromycin     Steven-Johnson's Syndrome  . Penicillins     Steven-Johnson's Syndrome  . Septra [Sulfamethoxazole-Trimethoprim]     Steven-Johnson's Syndrome    Patient Measurements: Height: 5\' 6"  (167.6 cm) Weight: 150 lb (68.04 kg) IBW/kg (Calculated) : 63.8  Vital Signs: Temp: 97.8 F (36.6 C) (11/20 1344) Temp Source: Oral (11/20 1344) BP: 123/71 mmHg (11/20 1344) Pulse Rate: 73 (11/20 1344) Intake/Output from previous day: 11/19 0701 - 11/20 0700 In: 240 [P.O.:240] Out: -  Intake/Output from this shift:    Labs:  Recent Labs  10/16/14 1811 10/17/14 0507 10/18/14 0435  WBC 11.1* 7.6 5.0  HGB 12.1* 11.0* 10.8*  PLT 189 218 210  CREATININE 0.71 0.65 0.59   Estimated Creatinine Clearance: 128.5 mL/min (by C-G formula based on Cr of 0.59).  Recent Labs  10/18/14 1746  [place] <5.0*     Microbiology: Recent Results (from the past 720 hour(s))  Culture, blood (routine x 2)     Status: None (Preliminary result)   Collection Time: 10/16/14  6:12 PM  Result Value Ref Range Status   Specimen Description BLOOD RIGHT ANTECUBITAL  Final   Special Requests BOTTLES DRAWN AEROBIC ONLY 2 ML  Final   Culture  Setup Time   Final    10/16/2014 22:40 Performed at [facility]    Culture   Final           BLOOD CULTURE RECEIVED NO GROWTH TO DATE CULTURE WILL BE HELD FOR 5 DAYS BEFORE ISSUING A FINAL NEGATIVE REPORT Performed at [facility]    Report Status PENDING  Incomplete  Culture, blood (routine x 2)     Status: None (Preliminary result)   Collection Time: 10/16/14  6:27 PM  Result Value Ref Range Status   Specimen Description BLOOD RIGHT HAND  Final   Special Requests BOTTLES DRAWN AEROBIC  AND ANAEROBIC 4 ML  Final   Culture  Setup Time   Final    10/16/2014 22:40 Performed at [facility]    Culture   Final           BLOOD CULTURE RECEIVED NO GROWTH TO DATE CULTURE WILL BE HELD FOR 5 DAYS BEFORE ISSUING A FINAL NEGATIVE REPORT Performed at [facility]    Report Status PENDING  Incomplete  Wound culture     Status: None (Preliminary result)   Collection Time: 10/16/14  7:29 PM  Result Value Ref Range Status   Specimen Description ABSCESS  Final   Special Requests NONE  Final   Gram Stain   Final    RARE WBC PRESENT,BOTH PMN AND MONONUCLEAR NO SQUAMOUS EPITHELIAL CELLS SEEN RARE GRAM POSITIVE COCCI IN PAIRS Performed at [facility]    Culture   Final    Culture reincubated for better growth Performed at [facility]    Report Status PENDING  Incomplete  Urine culture     Status: None   Collection Time: 10/16/14  7:55 PM  Result Value Ref Range Status   Specimen Description URINE, CLEAN CATCH  Final   Special Requests NONE  Final   Culture  Setup Time   Final    10/17/2014 13:27 Performed at [place]  NO GROWTH Performed at [facility]   Final   Culture NO GROWTH Performed at [facility]   Final   Report Status 10/18/2014 FINAL  Final    Medical History: Past Medical History  Diagnosis Date  . History of Stevens-Johnson toxic epidermal necrolysis overlap syndrome     with PCN, not with keflex  . Opiate addiction     on methadone  . Hepatitis C     Medications:  Scheduled:  . aztreonam  1 g Intravenous 3 times per day  . nicotine  21 mg Transdermal Daily  . vancomycin  1,500 mg Intravenous Q12H   Infusions:    PRN:   Assessment: 23 yo male with opiate addiction, Hep C and hx SJS to multiple abx presents to ED with left arm redness, swelling and pain worsening over past week s/p IV drug use.  11/18 >> Vancomycin >> 11/18 >> aztreonam >>  Tmax: afebrile WBC:  WNL Renal: SCr stable, CrCl > 100 ml/min  11/18 blood: ngtd 11/18 wound: reincubated for better growth 11/20: Vancomycin trough = < 5, goal 10-15 mcg/ml  Goal of Therapy:  Vancomycin trough level 10-15 mcg/ml  Plan:   Increase Vancomycin to 1500mg  IV q12  Continue aztreonam 1g IV q8  Await further plans for abx's and possible narrowing and/or changing to PO  Minda Ditto PharmD Pager [phone] 10/18/2014, 7:33 PM

## 2014-10-18 NOTE — Progress Notes (Signed)
ANTIBIOTIC CONSULT NOTE - follow up  Pharmacy Consult for Vancomycin Indication: Cellulitis  Allergies  Allergen Reactions  . Amoxicillin     Steven-Johnson's Syndrome  . Ceclor [Cefaclor] Other (See Comments)    Steven-Johnson's Syndrome  . Erythromycin     Steven-Johnson's Syndrome  . Penicillins     Steven-Johnson's Syndrome  . Septra [Sulfamethoxazole-Trimethoprim]     Steven-Johnson's Syndrome    Patient Measurements: Height: 5\' 6"  (167.6 cm) Weight: 150 lb (68.04 kg) IBW/kg (Calculated) : 63.8  Vital Signs: Temp: 98 F (36.7 C) (11/20 0616) Temp Source: Oral (11/20 0616) BP: 130/64 mmHg (11/20 0616) Pulse Rate: 64 (11/20 0616) Intake/Output from previous day: 11/19 0701 - 11/20 0700 In: 240 [P.O.:240] Out: -  Intake/Output from this shift:    Labs:  Recent Labs  10/16/14 1811 10/17/14 0507 10/18/14 0435  WBC 11.1* 7.6 5.0  HGB 12.1* 11.0* 10.8*  PLT 189 218 210  CREATININE 0.71 0.65 0.59   Estimated Creatinine Clearance: 128.5 mL/min (by C-G formula based on Cr of 0.59). No results for input(s): VANCOTROUGH, VANCOPEAK, VANCORANDOM, GENTTROUGH, GENTPEAK, GENTRANDOM, TOBRATROUGH, TOBRAPEAK, TOBRARND, AMIKACINPEAK, AMIKACINTROU, AMIKACIN in the last 72 hours.   Microbiology: Recent Results (from the past 720 hour(s))  Culture, blood (routine x 2)     Status: None (Preliminary result)   Collection Time: 10/16/14  6:12 PM  Result Value Ref Range Status   Specimen Description BLOOD RIGHT ANTECUBITAL  Final   Special Requests BOTTLES DRAWN AEROBIC ONLY 2 ML  Final   Culture  Setup Time   Final    10/16/2014 22:40 Performed at [facility]    Culture   Final           BLOOD CULTURE RECEIVED NO GROWTH TO DATE CULTURE WILL BE HELD FOR 5 DAYS BEFORE ISSUING A FINAL NEGATIVE REPORT Performed at [facility]    Report Status PENDING  Incomplete  Culture, blood (routine x 2)     Status: None (Preliminary result)   Collection Time: 10/16/14   6:27 PM  Result Value Ref Range Status   Specimen Description BLOOD RIGHT HAND  Final   Special Requests BOTTLES DRAWN AEROBIC AND ANAEROBIC 4 ML  Final   Culture  Setup Time   Final    10/16/2014 22:40 Performed at [facility]    Culture   Final           BLOOD CULTURE RECEIVED NO GROWTH TO DATE CULTURE WILL BE HELD FOR 5 DAYS BEFORE ISSUING A FINAL NEGATIVE REPORT Performed at [facility]    Report Status PENDING  Incomplete  Wound culture     Status: None (Preliminary result)   Collection Time: 10/16/14  7:29 PM  Result Value Ref Range Status   Specimen Description ABSCESS  Final   Special Requests NONE  Final   Gram Stain   Final    RARE WBC PRESENT,BOTH PMN AND MONONUCLEAR NO SQUAMOUS EPITHELIAL CELLS SEEN RARE GRAM POSITIVE COCCI IN PAIRS Performed at [facility]    Culture   Final    Culture reincubated for better growth Performed at [facility]    Report Status PENDING  Incomplete    Medical History: Past Medical History  Diagnosis Date  . History of Stevens-Johnson toxic epidermal necrolysis overlap syndrome     with PCN, not with keflex  . Opiate addiction     on methadone  . Hepatitis C     Medications:  Scheduled:  .  aztreonam  1 g Intravenous 3 times per day  . nicotine  21 mg Transdermal Daily  . vancomycin  1,250 mg Intravenous Q12H   Infusions:    PRN:   Assessment: 23 yo male with opiate addiction, Hep C and hx SJS to multiple abx presents to ED with left arm redness, swelling and pain worsening over past week s/p IV drug use.  11/18 >> Vancomycin >> 11/18 >> aztreonam >>  Tmax: afebrile WBC: WNL Renal: SCr stable, CrCl > 100 ml/min  11/18 blood: ngtd 11/18 wound: reincubated for better growth  Goal of Therapy:  Vancomycin trough level 10-15 mcg/ml  Plan:  1) Continue vancomycin 1250mg  IV q12 for now 2) Will check a vanc trough tonight prior to next dose which will be the 6th dose 3) Continue  aztreonam 1g IV q8 4) Await further plans for abx's and possible narrowing and/or changing to [place], PharmD, [facility] Pager [phone] 10/18/2014 10:06 AM

## 2014-10-18 NOTE — Progress Notes (Signed)
PROGRESS NOTE  [name] KCL:[id number] DOB: 12-30-1990 DOA: 10/16/2014 PCP: PROVIDER NOT IN SYSTEM  HPI: 23 year old male with past mental history of hepatitis C and IV drug abuse admitted on 11/19 for a left arm cellulitis secondary to a broken needle in the left antecubital fossa which occurred 10 days prior when patient attempted to inject homemade opiates into that arm.  Subjective/ 24 H Interval events - He feels much better this morning, feels that his arm is much better  Assessment/Plan: Principal Problem:   Cellulitis of forearm, left Active Problems:   IV drug abuse   Hepatitis C   Acute foreign body of left upper arm   Tobacco abuse   Cellulitis of forearm, left secondary to acute foreign body of left upper arm: Continue IV antibiotics, much improved today almost back to normal. Transition to po tomorrow and likely can go home.  - Dr. Apolonio Schneiders from orthopedics evaluated patient, recommending daily dressing and packing changes without need for surgery.  - patient will set up an appointment with his PCP in 5 days IV drug abuse: Patient counseled. Given young age and long history, he needs aggressive outpatient therapy. Appreciate SW consult. Seems determined to quit.  Hepatitis C: We'll check viral load. Do not think patient has close follow-up for this. PCP follow up, may need referral to GI as an outpatient.  Tobacco abuse: On nicotine patch   Diet: regular Fluids: none  DVT Prophylaxis: SCD  Code Status: Full Family Communication: d/w patient  Disposition Plan: home when ready   Consultants:  Orthopedic surgery   Procedures:  I&D in the ED 11/18   Antibiotics Vancomycin/Aztreonam 11/18 >>   Studies   Objective  Filed Vitals:   10/17/14 0738 10/17/14 1123 10/17/14 1447 10/18/14 0616  BP: 127/66 121/64 127/69 130/64  Pulse: 89 84 81 64  Temp: 98.1 F (36.7 C) 98.3 F (36.8 C) 98.2 F (36.8 C) 98 F (36.7 C)  TempSrc: Oral Oral Oral Oral    Resp: 20 18 20 18   Height:      Weight:      SpO2: 100% 100% 100% 10%    Intake/Output Summary (Last 24 hours) at 10/18/14 1155 Last data filed at 10/17/14 1735  Gross per 24 hour  Intake    240 ml  Output      0 ml  Net    240 ml   Filed Weights   10/16/14 1823 10/16/14 2133  Weight: 68.04 kg (150 lb) 68.04 kg (150 lb)    Exam:  General:  NAD  Cardiovascular: RRR  Respiratory: CTA biL  Abdomen: soft, non tender  MSK: no edema, left forearm with mild cellulitic changes, packing in place  Data Reviewed: Basic Metabolic Panel:  Recent Labs Lab 10/16/14 1811 10/17/14 0507 10/18/14 0435  NA 136* 135* 140  K 3.9 3.8 3.8  CL 99 102 103  CO2 24 25 26   GLUCOSE 114* 105* 111*  BUN 11 9 10   CREATININE 0.71 0.65 0.59  CALCIUM 9.0 8.3* 8.9   Liver Function Tests:  Recent Labs Lab 10/16/14 1811 10/17/14 0507  AST 35 25  ALT 59* 45  ALKPHOS 74 66  BILITOT 0.4 0.6  PROT 7.3 6.1  ALBUMIN 3.5 2.9*   CBC:  Recent Labs Lab 10/16/14 1811 10/17/14 0507 10/18/14 0435  WBC 11.1* 7.6 5.0  NEUTROABS 7.2 4.5  --   HGB 12.1* 11.0* 10.8*  HCT 36.0* 32.7* 31.8*  MCV 87.0 86.5 85.3  PLT  189 218 210   CBG:  Recent Labs Lab 10/17/14 0039 10/17/14 1543 10/17/14 1822 10/18/14 10/18/14 0613  GLUCAP 100* 122* 150* 111* 115*    Recent Results (from the past 240 hour(s))  Culture, blood (routine x 2)     Status: None (Preliminary result)   Collection Time: 10/16/14  6:12 PM  Result Value Ref Range Status   Specimen Description BLOOD RIGHT ANTECUBITAL  Final   Special Requests BOTTLES DRAWN AEROBIC ONLY 2 ML  Final   Culture  Setup Time   Final    10/16/2014 22:40 Performed at [facility]    Culture   Final           BLOOD CULTURE RECEIVED NO GROWTH TO DATE CULTURE WILL BE HELD FOR 5 DAYS BEFORE ISSUING A FINAL NEGATIVE REPORT Performed at [facility]    Report Status PENDING  Incomplete  Culture, blood (routine x 2)     Status: None  (Preliminary result)   Collection Time: 10/16/14  6:27 PM  Result Value Ref Range Status   Specimen Description BLOOD RIGHT HAND  Final   Special Requests BOTTLES DRAWN AEROBIC AND ANAEROBIC 4 ML  Final   Culture  Setup Time   Final    10/16/2014 22:40 Performed at [facility]    Culture   Final           BLOOD CULTURE RECEIVED NO GROWTH TO DATE CULTURE WILL BE HELD FOR 5 DAYS BEFORE ISSUING A FINAL NEGATIVE REPORT Performed at [facility]    Report Status PENDING  Incomplete  Wound culture     Status: None (Preliminary result)   Collection Time: 10/16/14  7:29 PM  Result Value Ref Range Status   Specimen Description ABSCESS  Final   Special Requests NONE  Final   Gram Stain   Final    RARE WBC PRESENT,BOTH PMN AND MONONUCLEAR NO SQUAMOUS EPITHELIAL CELLS SEEN RARE GRAM POSITIVE COCCI IN PAIRS Performed at [facility]    Culture   Final    Culture reincubated for better growth Performed at [facility]    Report Status PENDING  Incomplete     Scheduled Meds: . aztreonam  1 g Intravenous 3 times per day  . nicotine  21 mg Transdermal Daily  . vancomycin  1,250 mg Intravenous Q12H   Continuous Infusions:   Time spent: 25 minutes  Marzetta Board, MD [facility] Pager [phone]. If 7 PM - 7 AM, please contact night-coverage at [email or web address], password [email or web address] 10/18/2014, 11:55 AM  LOS: 2 days

## 2014-10-18 NOTE — Progress Notes (Signed)
Clinical Social Work  CSW met with patient at bedside to follow up from previous assessment. Patient reports he spoke with probation officer to confirm he was in the hospital and court received his letter stating that he was missing court today. Patient reports that family has come to visit but he is anxious to DC and ready to go home. Patient reports that he has thought about SA options and will try to go to a NA meeting to determine if it is a good fit and if he can find a sponsor. CSW and patient discussed the importance of a sponsor to avoid further drug use. Patient reports that his job is strict on not using substances so he wants to make sure he is sober so he is not fired. Patient thanked CSW for visit but reports no immediate needs. CSW will continue to follow.  [place], [place] [phone]

## 2014-10-18 NOTE — Progress Notes (Signed)
CARE MANAGEMENT NOTE 10/18/2014  Patient:  [name], [name]   Account Number:  [id number]  Date Initiated:  10/18/2014  Documentation initiated by:  Edwyna Shell  Subjective/Objective Assessment:   23 yo male admitted with Cellulitis of left arm from home, has PCP     Action/Plan:   discharge planning   Anticipated DC Date:  10/19/2014   Anticipated DC Plan:  [place]  CM consult      Choice offered to / List presented to:             Status of service:  In process, will continue to follow Medicare Important Message given?   (If response is "NO", the following Medicare IM given date fields will be blank) Date Medicare IM given:   Medicare IM given by:   Date Additional Medicare IM given:   Additional Medicare IM given by:    Discharge Disposition:    Per UR Regulation:    If discussed at Long Length of Stay Meetings, dates discussed:    Comments:  10/18/14 Edwyna Shell RN, BSN, [place] Patient has PCP, Dr. Wendie Wilson, with [facility], In [place]. Discussed [place] services regarding the possibility of a RN to follow up with teaching for dressing changes and he stated that he does not need a [place] RN and that he will be going out of town for 2 weeks with his job and will be able to take care of his own dressing changes. Will continue to follow

## 2014-10-19 LAB — WOUND CULTURE

## 2014-10-19 LAB — GLUCOSE, CAPILLARY
GLUCOSE-CAPILLARY: 105 mg/dL — AB (ref 70–99)
Glucose-Capillary: 99 mg/dL (ref 70–99)

## 2014-10-19 MED ORDER — CLINDAMYCIN HCL 300 MG PO CAPS: 300.0000 mg | ORAL_CAPSULE | Freq: Three times a day (TID) | ORAL | Status: DC

## 2014-10-19 MED ORDER — OXYCODONE HCL 5 MG PO TABS
5.0000 mg | ORAL_TABLET | ORAL | Status: DC | PRN
Start: 2014-10-19 — End: 2019-02-09

## 2014-10-19 MED ORDER — OXYCODONE HCL 5 MG PO TABS
5.0000 mg | ORAL_TABLET | ORAL | Status: DC | PRN
Start: 2014-10-19 — End: 2014-10-19

## 2014-10-19 NOTE — Discharge Summary (Signed)
Physician Discharge Summary  [name] AST:[id number] DOB: 1991-06-10 DOA: 10/16/2014  PCP: Enid Skeens., MD  Admit date: 10/16/2014 Discharge date: 10/19/2014  Time spent: 35 minutes  Recommendations for Outpatient Follow-up:  1. Follow up with PCP in 1 week 2. Daily dressing changes   Discharge Diagnoses:  Principal Problem:   Cellulitis of forearm, left Active Problems:   IV drug abuse   Hepatitis C   Acute foreign body of left upper arm   Tobacco abuse  Discharge Condition: stable  Diet recommendation: regular   Filed Weights   10/16/14 1823 10/16/14 2133  Weight: 68.04 kg (150 lb) 68.04 kg (150 lb)   History of present illness:  [name] is a 23 y.o. male with history of IV drug abuse and hepatitis C present. To the ER because of worsening pain and swelling of the left forearm. Patient stated that he injected Opana 10 days ago on his forearm and gradually notices that he started elevating swelling and pain over the injected site with fever and chills. Patient also has been having some abdominal pain with nausea vomiting. Since his pain and swelling worsened and unable to move his elbow patient came to the ER. In the ER patient had x-rays done of the forearm which showed a foreign body on his antecubital fossa. ER physician had incised and drained a small abscess on his forearm. On-call orthopedic surgeon for hand surgery Dr. Apolonio Schneiders was consulted. Dr. Apolonio Schneiders is requested to keep patient on splint at this time. At this time patient has been admitted for IV antibiotics. Patient's abdomen on exam appears benign. Patient states he did inject a few months ago in his left antecubital fossa.  Hospital Course:  Cellulitis of forearm - left secondary to acute foreign body of left upper arm. Patient was started on IV antibiotics with significant improvement in his cellulitis and by day 2 his swelling and erythema have resolved. Orthopedic surgery was consulted and  recommended antibiotics without any additional surgical interventions. Microbiology was obtained in the ED and grew strep as below. Patient's antibiotics were narrowed to clindamycin and will need 7 additional days following discharge. Patient scheduled an appointment with his PCP mid next week. He did not wish to have RN home visits for dressing changes and will be doing them by himself. Strongly advised for daily changes and to visit nearest ED as soon as possible if he has worsening symptoms.  IV drug abuse: Patient counseled. Given young age and long history, he needs aggressive outpatient therapy. Appreciate SW consult. Seems determined to quit.  Hepatitis C: PCP follow up, may need referral to GI as an outpatient.  Tobacco abuse: counseled to quit  Procedures:  I&D in the ED 11/18  Consultations:  Orthopedic surgery   Discharge Exam: Filed Vitals:   10/18/14 0616 10/18/14 1344 10/18/14 2128 10/19/14 0537  BP: 130/64 123/71 119/69 133/77  Pulse: 64 73 54 87  Temp: 98 F (36.7 C) 97.8 F (36.6 C) 98.1 F (36.7 C) 98 F (36.7 C)  TempSrc: Oral Oral Oral Oral  Resp: 18 18 18 18   Height:      Weight:      SpO2: 10% 100% 100% 100%   General: NAD Cardiovascular: RRR Respiratory: CTA biL  Discharge Instructions    Medication List    STOP taking these medications        GOODY HEADACHE PO     predniSONE 20 MG tablet  Commonly known as:  DELTASONE  TAKE these medications        clindamycin 300 MG capsule  Commonly known as:  CLEOCIN  Take 1 capsule (300 mg total) by mouth 3 (three) times daily.     diphenhydrAMINE 25 MG tablet  Commonly known as:  BENADRYL  Take 1 tablet (25 mg total) by mouth every 6 (six) hours as needed for itching (Rash).     famotidine 20 MG tablet  Commonly known as:  PEPCID  Take 1 tablet (20 mg total) by mouth 2 (two) times daily.     ibuprofen 200 MG tablet  Commonly known as:  ADVIL,MOTRIN  Take 800 mg by mouth every 6 (six)  hours as needed for headache.     oxyCODONE 5 MG immediate release tablet  Commonly known as:  ROXICODONE  Take 1 tablet (5 mg total) by mouth every 4 (four) hours as needed for severe pain.       Follow-up Information    Follow up with SLATOSKY,JOHN J., MD. Schedule an appointment as soon as possible for a visit in 5 days.   Specialty:  Family Medicine   Contact information:   [address] [address] [phone]       The results of significant diagnostics from this hospitalization (including imaging, microbiology, ancillary and laboratory) are listed below for reference.    Significant Diagnostic Studies: Dg Forearm Left  10/16/2014   CLINICAL DATA:  Cellulitis; intravenous drug user; pt has redness, swelling, and pain-- pt states he used intravenous drugs 1.5 weeks ago and part of a needle may have broken off in his arm  EXAM: LEFT FOREARM - 2 VIEW  COMPARISON:  Humerus films of same date  FINDINGS: No acute fracture or dislocation. Soft tissue swelling about the proximal forearm. Metallic object which projects medial to the the antecubital fossa and measures 8 mm.  IMPRESSION: Radiopaque foreign object projecting about the medial aspect of the antecubital fossa. Presumably a needle fragment. Soft tissue swelling without osseous abnormality.   Electronically Signed   By: Abigail Miyamoto M.D.   On: 10/16/2014 20:02   Dg Humerus Left  10/16/2014   CLINICAL DATA:  Cellulitis, intravenous drug use with redness and swelling in the upper arm  EXAM: LEFT HUMERUS - 2+ VIEW  COMPARISON:  None.  FINDINGS: No acute fracture or dislocation is noted. Generalized soft tissue swelling is noted conscious with a given clinical history. There is a small 7 mm radiopaque foreign body in the medial aspect of the antecubital fossa consistent with the given clinical history of broken needle. No other foreign bodies are noted. No other focal abnormality is seen.  IMPRESSION: Generalized soft tissue  swelling with evidence of foreign body in the medial aspect of the antecubital fossa as described.   Electronically Signed   By: Inez Catalina M.D.   On: 10/16/2014 20:04    Microbiology: Recent Results (from the past 240 hour(s))  Culture, blood (routine x 2)     Status: None (Preliminary result)   Collection Time: 10/16/14  6:12 PM  Result Value Ref Range Status   Specimen Description BLOOD RIGHT ANTECUBITAL  Final   Special Requests BOTTLES DRAWN AEROBIC ONLY 2 ML  Final   Culture  Setup Time   Final    10/16/2014 22:40 Performed at [facility]    Culture   Final           BLOOD CULTURE RECEIVED NO GROWTH TO DATE CULTURE WILL BE HELD FOR  5 DAYS BEFORE ISSUING A FINAL NEGATIVE REPORT Performed at [facility]    Report Status PENDING  Incomplete  Culture, blood (routine x 2)     Status: None (Preliminary result)   Collection Time: 10/16/14  6:27 PM  Result Value Ref Range Status   Specimen Description BLOOD RIGHT HAND  Final   Special Requests BOTTLES DRAWN AEROBIC AND ANAEROBIC 4 ML  Final   Culture  Setup Time   Final    10/16/2014 22:40 Performed at [facility]    Culture   Final           BLOOD CULTURE RECEIVED NO GROWTH TO DATE CULTURE WILL BE HELD FOR 5 DAYS BEFORE ISSUING A FINAL NEGATIVE REPORT Performed at [facility]    Report Status PENDING  Incomplete  Wound culture     Status: None   Collection Time: 10/16/14  7:29 PM  Result Value Ref Range Status   Specimen Description ABSCESS  Final   Special Requests NONE  Final   Gram Stain   Final    RARE WBC PRESENT,BOTH PMN AND MONONUCLEAR NO SQUAMOUS EPITHELIAL CELLS SEEN RARE GRAM POSITIVE COCCI IN PAIRS Performed at [facility]    Culture   Final    FEW MICROAEROPHILIC STREPTOCOCCI Note: Standardized susceptibility testing for this organism is not available. Performed at [facility]    Report Status 10/19/2014 FINAL  Final  Urine culture     Status: None    Collection Time: 10/16/14  7:55 PM  Result Value Ref Range Status   Specimen Description URINE, CLEAN CATCH  Final   Special Requests NONE  Final   Culture  Setup Time   Final    10/17/2014 13:27 Performed at [place] Performed at [facility]   Final   Culture NO GROWTH Performed at [facility]   Final   Report Status 10/18/2014 FINAL  Final    Labs: Basic Metabolic Panel:  Recent Labs Lab 10/16/14 1811 10/17/14 0507 10/18/14 0435  NA 136* 135* 140  K 3.9 3.8 3.8  CL 99 102 103  CO2 24 25 26   GLUCOSE 114* 105* 111*  BUN 11 9 10   CREATININE 0.71 0.65 0.59  CALCIUM 9.0 8.3* 8.9   Liver Function Tests:  Recent Labs Lab 10/16/14 1811 10/17/14 0507  AST 35 25  ALT 59* 45  ALKPHOS 74 66  BILITOT 0.4 0.6  PROT 7.3 6.1  ALBUMIN 3.5 2.9*   CBC:  Recent Labs Lab 10/16/14 1811 10/17/14 0507 10/18/14 0435  WBC 11.1* 7.6 5.0  NEUTROABS 7.2 4.5  --   HGB 12.1* 11.0* 10.8*  HCT 36.0* 32.7* 31.8*  MCV 87.0 86.5 85.3  PLT 189 218 210   CBG:  Recent Labs Lab 10/18/14 0613 10/18/14 1144 10/18/14 1829 10/19/14 0019 10/19/14 0622  GLUCAP 115* 121* 177* 99 105*   Signed:  GHERGHE, COSTIN  [facility] 10/19/2014, 2:22 PM

## 2014-10-19 NOTE — Progress Notes (Signed)
Pt given discharge instructions. No concerns voiced. Prescriptions also given for pain meds and antibiotics. Dressing change performed to left arm wound prior to Dc. Pt given supplies that were in room.  Voiced understanding and importance  of scheduled dsg changes.  Left unit in good condition ambulating off unit. No concerns voiced. Vw, rn.

## 2014-10-22 LAB — CULTURE, BLOOD (ROUTINE X 2)
CULTURE: NO GROWTH
Culture: NO GROWTH

## 2019-02-07 ENCOUNTER — Other Ambulatory Visit: Payer: Self-pay

## 2019-02-07 ENCOUNTER — Inpatient Hospital Stay (HOSPITAL_COMMUNITY)
Admission: EM | Admit: 2019-02-07 | Discharge: 2019-02-09 | DRG: 603 | Disposition: A | Discharge status: Home / Self Care | Payer: Medicaid Other | Attending: Internal Medicine | Admitting: Internal Medicine

## 2019-02-07 ENCOUNTER — Encounter (HOSPITAL_COMMUNITY): Payer: Self-pay

## 2019-02-07 DIAGNOSIS — Z881 Allergy status to other antibiotic agents status: Secondary | ICD-10-CM

## 2019-02-07 DIAGNOSIS — F1120 Opioid dependence, uncomplicated: Secondary | ICD-10-CM | POA: Diagnosis present

## 2019-02-07 DIAGNOSIS — Z882 Allergy status to sulfonamides status: Secondary | ICD-10-CM

## 2019-02-07 DIAGNOSIS — F17210 Nicotine dependence, cigarettes, uncomplicated: Secondary | ICD-10-CM | POA: Diagnosis present

## 2019-02-07 DIAGNOSIS — Z880 Allergy status to penicillin: Secondary | ICD-10-CM | POA: Diagnosis not present

## 2019-02-07 DIAGNOSIS — F1910 Other psychoactive substance abuse, uncomplicated: Secondary | ICD-10-CM | POA: Diagnosis not present

## 2019-02-07 DIAGNOSIS — B1920 Unspecified viral hepatitis C without hepatic coma: Secondary | ICD-10-CM | POA: Diagnosis present

## 2019-02-07 DIAGNOSIS — L0211 Cutaneous abscess of neck: Principal | ICD-10-CM | POA: Diagnosis present

## 2019-02-07 DIAGNOSIS — Z886 Allergy status to analgesic agent status: Secondary | ICD-10-CM

## 2019-02-07 DIAGNOSIS — Z833 Family history of diabetes mellitus: Secondary | ICD-10-CM

## 2019-02-07 LAB — CBC WITH DIFFERENTIAL/PLATELET
Abs Immature Granulocytes: 0.01 10*3/uL (ref 0.00–0.07)
Basophils Absolute: 0 10*3/uL (ref 0.0–0.1)
Basophils Relative: 1 %
EOS PCT: 5 %
Eosinophils Absolute: 0.3 10*3/uL (ref 0.0–0.5)
HCT: 41 % (ref 39.0–52.0)
Hemoglobin: 13.3 g/dL (ref 13.0–17.0)
Immature Granulocytes: 0 %
Lymphocytes Relative: 27 %
Lymphs Abs: 1.7 10*3/uL (ref 0.7–4.0)
MCH: 28.7 pg (ref 26.0–34.0)
MCHC: 32.4 g/dL (ref 30.0–36.0)
MCV: 88.4 fL (ref 80.0–100.0)
Monocytes Absolute: 0.4 10*3/uL (ref 0.1–1.0)
Monocytes Relative: 7 %
Neutro Abs: 3.8 10*3/uL (ref 1.7–7.7)
Neutrophils Relative %: 60 %
Platelets: 317 10*3/uL (ref 150–400)
RBC: 4.64 MIL/uL (ref 4.22–5.81)
RDW: 12.7 % (ref 11.5–15.5)
WBC: 6.3 10*3/uL (ref 4.0–10.5)
nRBC: 0 % (ref 0.0–0.2)

## 2019-02-07 LAB — COMPREHENSIVE METABOLIC PANEL
ALBUMIN: 4.5 g/dL (ref 3.5–5.0)
ALK PHOS: 90 U/L (ref 38–126)
ALT: 12 U/L (ref 0–44)
AST: 20 U/L (ref 15–41)
Anion gap: 9 (ref 5–15)
BUN: 9 mg/dL (ref 6–20)
CO2: 29 mmol/L (ref 22–32)
Calcium: 9.4 mg/dL (ref 8.9–10.3)
Chloride: 99 mmol/L (ref 98–111)
Creatinine, Ser: 0.66 mg/dL (ref 0.61–1.24)
GFR calc Af Amer: >60 mL/min (ref >60)
GFR calc non Af Amer: >60 mL/min (ref >60)
GLUCOSE: 102 mg/dL — AB (ref 70–99)
Potassium: 3.8 mmol/L (ref 3.5–5.1)
Sodium: 137 mmol/L (ref 135–145)
Total Bilirubin: 0.3 mg/dL (ref 0.3–1.2)
Total Protein: 8.5 g/dL — ABNORMAL HIGH (ref 6.5–8.1)

## 2019-02-07 LAB — ETHANOL: Alcohol, Ethyl (B): <10 mg/dL (ref <10)

## 2019-02-07 LAB — RAPID URINE DRUG SCREEN, HOSP PERFORMED
Amphetamines: NOT DETECTED
Barbiturates: NOT DETECTED
Benzodiazepines: POSITIVE — AB
Cocaine: NOT DETECTED
OPIATES: NOT DETECTED
Tetrahydrocannabinol: NOT DETECTED

## 2019-02-07 LAB — LACTIC ACID, PLASMA: Lactic Acid, Venous: 0.7 mmol/L (ref 0.5–1.9)

## 2019-02-07 MED ORDER — NICOTINE 14 MG/24HR TD PT24
14.0000 mg | MEDICATED_PATCH | Freq: Every day | TRANSDERMAL | Status: DC
  Administered 2019-02-07 – 2019-02-08 (×2): 14 mg via TRANSDERMAL
  Filled 2019-02-07 (×2): qty 1

## 2019-02-07 MED ORDER — FAMOTIDINE 20 MG PO TABS
20.0000 mg | ORAL_TABLET | Freq: Two times a day (BID) | ORAL | Status: DC
  Administered 2019-02-07 – 2019-02-09 (×4): 20 mg via ORAL
  Filled 2019-02-07 (×4): qty 1

## 2019-02-07 MED ORDER — BUPRENORPHINE HCL-NALOXONE HCL 8-2 MG SL SUBL
1.0000 | SUBLINGUAL_TABLET | Freq: Every day | SUBLINGUAL | Status: DC
  Administered 2019-02-07 – 2019-02-09 (×3): 1 via SUBLINGUAL
  Filled 2019-02-07 (×3): qty 1

## 2019-02-07 MED ORDER — LEVOFLOXACIN IN D5W 750 MG/150ML IV SOLN
750.0000 mg | Freq: Once | INTRAVENOUS | Status: AC
  Administered 2019-02-07: 750 mg via INTRAVENOUS
  Filled 2019-02-07: qty 150

## 2019-02-07 MED ORDER — DEXTROSE-NACL 5-0.45 % IV SOLN
INTRAVENOUS | Status: DC
  Administered 2019-02-08: via INTRAVENOUS

## 2019-02-07 MED ORDER — LEVOFLOXACIN IN D5W 750 MG/150ML IV SOLN
750.0000 mg | INTRAVENOUS | Status: DC
  Administered 2019-02-08: 750 mg via INTRAVENOUS
  Filled 2019-02-07 (×2): qty 150

## 2019-02-07 MED ORDER — KETOROLAC TROMETHAMINE 30 MG/ML IJ SOLN
30.0000 mg | Freq: Once | INTRAMUSCULAR | Status: AC
  Administered 2019-02-08: 30 mg via INTRAVENOUS
  Filled 2019-02-07 (×2): qty 1

## 2019-02-07 MED ORDER — SODIUM CHLORIDE 0.9 % IV BOLUS
1000.0000 mL | Freq: Once | INTRAVENOUS | Status: AC
  Administered 2019-02-07: 1000 mL via INTRAVENOUS

## 2019-02-07 MED ORDER — VANCOMYCIN HCL IN DEXTROSE 1-5 GM/200ML-% IV SOLN
1000.0000 mg | Freq: Once | INTRAVENOUS | Status: AC
  Administered 2019-02-07: 1000 mg via INTRAVENOUS
  Filled 2019-02-07: qty 200

## 2019-02-07 MED ORDER — METRONIDAZOLE IN NACL 5-0.79 MG/ML-% IV SOLN
500.0000 mg | Freq: Once | INTRAVENOUS | Status: AC
  Administered 2019-02-07: 500 mg via INTRAVENOUS
  Filled 2019-02-07: qty 100

## 2019-02-07 MED ORDER — VANCOMYCIN HCL IN DEXTROSE 1-5 GM/200ML-% IV SOLN: 1000.0000 mg | INTRAVENOUS | Status: DC

## 2019-02-07 MED ORDER — HEPARIN SODIUM (PORCINE) 5000 UNIT/ML IJ SOLN
5000.0000 [IU] | Freq: Three times a day (TID) | INTRAMUSCULAR | Status: DC
  Administered 2019-02-07 – 2019-02-09 (×5): 5000 [IU] via SUBCUTANEOUS
  Filled 2019-02-07 (×5): qty 1

## 2019-02-07 MED ORDER — IBUPROFEN 400 MG PO TABS
800.0000 mg | ORAL_TABLET | Freq: Four times a day (QID) | ORAL | Status: DC | PRN
  Administered 2019-02-07 – 2019-02-08 (×2): 800 mg via ORAL
  Filled 2019-02-07 (×2): qty 2

## 2019-02-07 MED ORDER — SODIUM CHLORIDE 0.9% FLUSH: 10.0000 mL | INTRAVENOUS | Status: DC | PRN

## 2019-02-07 MED ORDER — METRONIDAZOLE IN NACL 5-0.79 MG/ML-% IV SOLN
500.0000 mg | Freq: Three times a day (TID) | INTRAVENOUS | Status: DC
  Administered 2019-02-08 – 2019-02-09 (×4): 500 mg via INTRAVENOUS
  Filled 2019-02-07 (×5): qty 100

## 2019-02-07 MED ORDER — BUPRENORPHINE HCL-NALOXONE HCL 8-2 MG SL FILM: 1.0000 | ORAL_FILM | Freq: Two times a day (BID) | SUBLINGUAL | Status: DC

## 2019-02-07 MED ORDER — IBUPROFEN 400 MG PO TABS: 800.0000 mg | ORAL_TABLET | Freq: Four times a day (QID) | ORAL | Status: DC | PRN

## 2019-02-07 MED ORDER — HYDROMORPHONE HCL 1 MG/ML IJ SOLN
1.0000 mg | Freq: Once | INTRAMUSCULAR | Status: AC
  Administered 2019-02-07: 1 mg via INTRAVENOUS
  Filled 2019-02-07: qty 1

## 2019-02-07 MED ORDER — VANCOMYCIN HCL 10 G IV SOLR: 1250.0000 mg | Freq: Two times a day (BID) | INTRAVENOUS | Status: DC

## 2019-02-07 MED ORDER — VANCOMYCIN HCL IN DEXTROSE 1-5 GM/200ML-% IV SOLN
1000.0000 mg | Freq: Two times a day (BID) | INTRAVENOUS | Status: DC
  Administered 2019-02-08 – 2019-02-09 (×4): 1000 mg via INTRAVENOUS
  Filled 2019-02-07 (×5): qty 200

## 2019-02-07 NOTE — ED Notes (Signed)
ED TO INPATIENT HANDOFF REPORT  ED Nurse Name and Phone #: [phone]  S Name/Age/Gender [name] 28 y.o. male Room/Bed: WA19/WA19  Code Status   Code Status: Full Code  Home/SNF/Other Home Patient oriented to: self Is this baseline? Yes   Triage Complete: Triage complete  Chief Complaint cyst on neck  Triage Note Pt states that he has an abscess on his neck on the left side, "under my clavicle". Pt states [facility] sent him to [facility], where they attempted to drain it. Pt states "they didn't do anything". Pt states Oval Linsey told him he needed surgery on the abscess. Pt states he can't move neck without pain.   Allergies Allergies  Allergen Reactions  . Amoxicillin     Steven-Johnson's Syndrome  . Ceclor [Cefaclor] Other (See Comments)    Steven-Johnson's Syndrome  . Erythromycin     Steven-Johnson's Syndrome  . Penicillins     Steven-Johnson's Syndrome  . Septra [Sulfamethoxazole-Trimethoprim]     Steven-Johnson's Syndrome  . Tylenol [Acetaminophen]     Level of Care/Admitting Diagnosis ED Disposition    ED Disposition Condition [facility] Area: [place] [[id number]]  Level of Care: Med-Surg [16]  Diagnosis: Neck abscess [979480]  Admitting Physician: Nita Sells [[id number]]  Attending Physician: Nita Sells [phone]  Estimated length of stay: 3 - 4 days  Certification:: I certify this patient will need inpatient services for at least 2 midnights  PT Class (Do Not Modify): Inpatient [101]  PT Acc Code (Do Not Modify): Private [1]       B Medical/Surgery History Past Medical History:  Diagnosis Date  . Hepatitis C   . History of Stevens-Johnson toxic epidermal necrolysis overlap syndrome    with PCN, not with keflex  . 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 TYMPANOSTOMY TUBE PLACEMENT     53-24 years old     A IV  Location/Drains/Wounds Patient Lines/Drains/Airways Status   Active Line/Drains/Airways    Name:   Placement date:   Placement time:   Site:   Days:   Peripheral IV 02/07/19 Left;Distal Forearm   02/07/19    1215    Forearm   less than 1   Peripheral IV 02/07/19 Left;Upper Forearm   02/07/19    1216    Forearm   less than 1          Intake/Output Last 24 hours No intake or output data in the 24 hours ending 02/07/19 1645  Labs/Imaging Results for orders placed or performed during the hospital encounter of 02/07/19 (from the past 48 hour(s))  Urine rapid drug screen (hosp performed)     Status: Abnormal   Collection Time: 02/07/19 11:35 AM  Result Value Ref Range   Opiates NONE DETECTED NONE DETECTED   Cocaine NONE DETECTED NONE DETECTED   Benzodiazepines POSITIVE (A) NONE DETECTED   Amphetamines NONE DETECTED NONE DETECTED   Tetrahydrocannabinol NONE DETECTED NONE DETECTED   Barbiturates NONE DETECTED NONE DETECTED    Comment: (NOTE) DRUG SCREEN FOR MEDICAL PURPOSES ONLY.  IF CONFIRMATION IS NEEDED FOR ANY PURPOSE, NOTIFY LAB WITHIN 5 DAYS. LOWEST DETECTABLE LIMITS FOR URINE DRUG SCREEN Drug Class                     Cutoff (ng/mL) Amphetamine and metabolites    1000 Barbiturate and metabolites    200 Benzodiazepine  956 Tricyclics and metabolites     300 Opiates and metabolites        300 Cocaine and metabolites        300 THC                            50 Performed at [facility], [place] [address], [place], [place] [address]   Comprehensive metabolic panel     Status: Abnormal   Collection Time: 02/07/19 12:09 PM  Result Value Ref Range   Sodium 137 135 - 145 mmol/L   Potassium 3.8 3.5 - 5.1 mmol/L   Chloride 99 98 - 111 mmol/L   CO2 29 22 - 32 mmol/L   Glucose, Bld 102 (H) 70 - 99 mg/dL   BUN 9 6 - 20 mg/dL   Creatinine, Ser 0.66 0.61 - 1.24 mg/dL   Calcium 9.4 8.9 - 10.3 mg/dL   Total Protein 8.5 (H) 6.5 - 8.1 g/dL   Albumin  4.5 3.5 - 5.0 g/dL   AST 20 15 - 41 U/L   ALT 12 0 - 44 U/L   Alkaline Phosphatase 90 38 - 126 U/L   Total Bilirubin 0.3 0.3 - 1.2 mg/dL   GFR calc non Af Amer >60 >60 mL/min   GFR calc Af Amer >60 >60 mL/min   Anion gap 9 5 - 15    Comment: Performed at [facility], [place] [address], [place], [place] [address]  CBC with Differential     Status: None   Collection Time: 02/07/19 12:09 PM  Result Value Ref Range   WBC 6.3 4.0 - 10.5 K/uL   RBC 4.64 4.22 - 5.81 MIL/uL   Hemoglobin 13.3 13.0 - 17.0 g/dL   HCT 41.0 39.0 - 52.0 %   MCV 88.4 80.0 - 100.0 fL   MCH 28.7 26.0 - 34.0 pg   MCHC 32.4 30.0 - 36.0 g/dL   RDW 12.7 11.5 - 15.5 %   Platelets 317 150 - 400 K/uL   nRBC 0.0 0.0 - 0.2 %   Neutrophils Relative % 60 %   Neutro Abs 3.8 1.7 - 7.7 K/uL   Lymphocytes Relative 27 %   Lymphs Abs 1.7 0.7 - 4.0 K/uL   Monocytes Relative 7 %   Monocytes Absolute 0.4 0.1 - 1.0 K/uL   Eosinophils Relative 5 %   Eosinophils Absolute 0.3 0.0 - 0.5 K/uL   Basophils Relative 1 %   Basophils Absolute 0.0 0.0 - 0.1 K/uL   Immature Granulocytes 0 %   Abs Immature Granulocytes 0.01 0.00 - 0.07 K/uL    Comment: Performed at [facility], [place] [address] [place], [place] [address]  Ethanol     Status: None   Collection Time: 02/07/19 12:11 PM  Result Value Ref Range   Alcohol, Ethyl (B) <10 <10 mg/dL    Comment: (NOTE) Lowest detectable limit for serum alcohol is 10 mg/dL. For medical purposes only. Performed at [facility], [place] [address] [place], [place] [address]   Lactic acid, plasma     Status: None   Collection Time: 02/07/19 12:11 PM  Result Value Ref Range   Lactic Acid, Venous 0.7 0.5 - 1.9 mmol/L    Comment: Performed at [facility], [place] [address] [place], [place] [address]   No results found.  Pending Labs [employer] (From admission, onward)    Start  Ordered   02/08/19 0500  CBC  Tomorrow  morning,   R     02/07/19 1639   02/08/19 0500  Comprehensive metabolic panel  Tomorrow morning,   R     02/07/19 1639   02/07/19 1640  HIV antibody (Routine Testing)  Once,   R     02/07/19 1639   02/07/19 1042  Lactic acid, plasma  Now then every 2 hours,   STAT     02/07/19 1041   02/07/19 1042  Culture, blood (routine x 2)  BLOOD CULTURE X 2,   STAT     02/07/19 1041          Vitals/Pain Today's Vitals   02/07/19 1545 02/07/19 1600 02/07/19 1615 02/07/19 1630  BP:  108/68  114/67  Pulse: 86 85 76 83  Resp: 16 15 (!) 27 20  Temp:      TempSrc:      SpO2: 96% 96% 98% 100%  Weight:      Height:      PainSc:        Isolation Precautions No active isolations  Medications Medications  metroNIDAZOLE (FLAGYL) IVPB 500 mg (has no administration in time range)  levofloxacin (LEVAQUIN) IVPB 750 mg (has no administration in time range)  vancomycin (VANCOCIN) IVPB 1000 mg/200 mL premix (has no administration in time range)  famotidine (PEPCID) tablet 20 mg (has no administration in time range)  ibuprofen (ADVIL,MOTRIN) tablet 800 mg (has no administration in time range)  Buprenorphine HCl-Naloxone HCl 8-2 MG FILM 1 Film (has no administration in time range)  heparin injection 5,000 Units (has no administration in time range)  dextrose 5 %-0.45 % sodium chloride infusion (has no administration in time range)  sodium chloride 0.9 % bolus 1,000 mL (0 mLs Intravenous Stopped 02/07/19 1459)  metroNIDAZOLE (FLAGYL) IVPB 500 mg (0 mg Intravenous Stopped 02/07/19 1419)  vancomycin (VANCOCIN) IVPB 1000 mg/200 mL premix (0 mg Intravenous Stopped 02/07/19 1502)  levofloxacin (LEVAQUIN) IVPB 750 mg (0 mg Intravenous Stopped 02/07/19 1607)  HYDROmorphone (DILAUDID) injection 1 mg (1 mg Intravenous Given 02/07/19 1317)  sodium chloride 0.9 % bolus 1,000 mL (0 mLs Intravenous Stopped 02/07/19 1607)    Mobility walks Low fall risk   Focused Assessments integumentary   R Recommendations:  See Admitting Provider Note  Report given to:   Additional Notes:  Substance abuse

## 2019-02-07 NOTE — ED Triage Notes (Addendum)
Pt states that he has an abscess on his neck on the left side, "under my clavicle". Pt states [facility] sent him to [facility], where they attempted to drain it. Pt states "they didn't do anything". Pt states Oval Linsey told him he needed surgery on the abscess. Pt states he can't move neck without pain.

## 2019-02-07 NOTE — ED Provider Notes (Signed)
[facility] DEPT Provider Note   CSN: [id number] Arrival date & time: 02/07/19  7001    History   Chief Complaint Chief Complaint  Patient presents with  . Abscess    HPI KEYONTAY STOLZ is a 28 y.o. male.     HPI   SAMEUL TAGLE is a 28 y.o. male, with a history of hepatitis C and IV drug abuse, presenting to the ED with left sided neck pain beginning March 5. Pain is stabbing, 10/10, radiating to the right side of the neck and into the throat and chest. Accompanied by fever, difficulty breathing, and difficulty swallowing.  He was seen at [facility] yesterday, had CT scan performed, was told he had an abscess and he would need surgery. Was told to go to [facility]. Seen at [facility], but then left AMA per chart review. Patient states, "I just couldn't stay there because they weren't doing anything for me." Last dose of ibuprofen was yesterday. Denies IV drug for several years. Denies suboxone or methadone use.   Denies N/V/D, drooling, posterior neck pain, neuro deficits, or any other complaints.   Past Medical History:  Diagnosis Date  . Hepatitis C   . History of Stevens-Johnson toxic epidermal necrolysis overlap syndrome    with PCN, not with keflex  . Opiate addiction ([place])    on methadone    Patient Active Problem List   Diagnosis Date Noted  . Neck abscess 02/07/2019  . Hepatitis C 10/17/2014  . Acute foreign body of left upper arm 10/17/2014  . Tobacco abuse 10/17/2014  . Cellulitis 10/16/2014  . Cellulitis of forearm, left 10/16/2014  . IV drug abuse ([place]) 10/16/2014    Past Surgical History:  Procedure Laterality Date  . TYMPANOSTOMY TUBE PLACEMENT     9-25 years old        Home Medications    Prior to Admission medications   Medication Sig Start Date End Date Taking? Authorizing Provider  ibuprofen (ADVIL,MOTRIN) 200 MG tablet Take 800 mg by mouth every 6 (six) hours as needed for headache.   Yes [provider]  SUBOXONE 8-2 MG FILM Place 1 Film under the tongue 2 (two) times daily. 01/30/19  Yes [provider]  clindamycin (CLEOCIN) 300 MG capsule Take 1 capsule (300 mg total) by mouth 3 (three) times daily. Patient not taking: Reported on 02/07/2019 10/19/14   Caren Griffins, MD  diphenhydrAMINE (BENADRYL) 25 MG tablet Take 1 tablet (25 mg total) by mouth every 6 (six) hours as needed for itching ([place]). Patient not taking: Reported on 02/07/2019 09/24/14   Muthersbaugh, Jarrett Soho, PA-C  famotidine (PEPCID) 20 MG tablet Take 1 tablet (20 mg total) by mouth 2 (two) times daily. Patient not taking: Reported on 02/07/2019 09/24/14   Muthersbaugh, Jarrett Soho, PA-C  oxyCODONE (ROXICODONE) 5 MG immediate release tablet Take 1 tablet (5 mg total) by mouth every 4 (four) hours as needed for severe pain. Patient not taking: Reported on 02/07/2019 10/19/14   Caren Griffins, MD    Family History Family History  Problem Relation Age of Onset  . Diabetes Other        grandparents  . Stroke Other   . Pancreatic cancer Other   . Diabetes Mellitus II Other     Social History Social History   Tobacco Use  . Smoking status: Current Every Day Smoker    Packs/day: 0.00    Years: 5.00    Pack years: 0.00  .  Smokeless tobacco: Never Used  Substance Use Topics  . Alcohol use: No  . Drug use: Yes    Types: Marijuana     Allergies   Amoxicillin; Ceclor [cefaclor]; Erythromycin; Penicillins; Septra [sulfamethoxazole-trimethoprim]; and Tylenol [acetaminophen]   Review of Systems Review of Systems  Constitutional: Positive for fever.  HENT: Positive for sore throat and trouble swallowing. Negative for drooling and voice change.   Respiratory: Positive for shortness of breath. Negative for cough and choking.   Cardiovascular: Negative for chest pain and leg swelling.  Gastrointestinal: Negative for abdominal pain, diarrhea, nausea and vomiting.  Musculoskeletal: Positive for neck  pain.  Neurological: Negative for weakness and numbness.  All other systems reviewed and are negative.    Physical Exam Updated Vital Signs BP 116/67   Pulse 86   Temp 98.1 F (36.7 C) (Oral)   Resp 18   Ht 5\' 6"  (1.676 m)   Wt 73.5 kg   SpO2 97%   BMI 26.15 kg/m   Physical Exam Vitals signs and nursing note reviewed.  Constitutional:      General: He is not in acute distress.    Appearance: He is well-developed. He is not diaphoretic.  HENT:     Head: Normocephalic and atraumatic.     Mouth/Throat:     Mouth: Mucous membranes are moist.     Pharynx: Oropharynx is clear.     Comments: Handles oral secretions without difficulty. No drooling. No trismus.  Eyes:     Extraocular Movements: Extraocular movements intact.     Conjunctiva/sclera: Conjunctivae normal.     Pupils: Pupils are equal, round, and reactive to light.  Neck:     Musculoskeletal: Neck supple. Muscular tenderness present.     Comments: Tenderness, firmness, and swelling to the left anterior neck from the trachea to the SCM and inferiorly to the clavicle. Tenderness also extends to left upper chest below clavicle, left trapezius, and submandibular region.  Difficulty turning head side to side, especially to the left.  Patient has wounds that may be consistent with needle marks over the bilateral external jugular veins. Cardiovascular:     Rate and Rhythm: Normal rate and regular rhythm.     Pulses: Normal pulses.          Carotid pulses are 2+ on the right side and 2+ on the left side.      Radial pulses are 2+ on the right side and 2+ on the left side.       Posterior tibial pulses are 2+ on the right side and 2+ on the left side.     Heart sounds: Normal heart sounds.     Comments: Tactile temperature in the extremities appropriate and equal bilaterally. Pulmonary:     Effort: Pulmonary effort is normal. No respiratory distress.     Breath sounds: Normal breath sounds.  Abdominal:     Palpations:  Abdomen is soft.     Tenderness: There is no abdominal tenderness. There is no guarding.  Musculoskeletal:     Right lower leg: No edema.     Left lower leg: No edema.  Lymphadenopathy:     Cervical: Cervical adenopathy present.  Skin:    General: Skin is warm and dry.     Comments: Wounds consistent with needle marks over the bilateral AC regions of the arms.  Neurological:     Mental Status: He is alert and oriented to person, place, and time.     Comments: Somnolent. Keeps eyes closed  during most of exam, but responds to questions quickly. Slow, slurred speech. Patient states, "I'm just really tired."  Sensation grossly intact to light touch in the extremities.  Grip strengths equal bilaterally.  Strength 5/5 in all extremities. No gait disturbance. Coordination intact. Cranial nerves III-XII grossly intact. No facial droop.   Psychiatric:        Mood and Affect: Mood and affect normal.        Speech: Speech normal.        Behavior: Behavior normal.      ED Treatments / Results  Labs (all labs ordered are listed, but only abnormal results are displayed) Labs Reviewed  RAPID URINE DRUG SCREEN, HOSP PERFORMED - Abnormal; Notable for the following components:      Result Value   Benzodiazepines POSITIVE (*)    All other components within normal limits  COMPREHENSIVE METABOLIC PANEL - Abnormal; Notable for the following components:   Glucose, Bld 102 (*)    Total Protein 8.5 (*)    All other components within normal limits  CULTURE, BLOOD (ROUTINE X 2)  CULTURE, BLOOD (ROUTINE X 2)  CBC WITH DIFFERENTIAL/PLATELET  ETHANOL  LACTIC ACID, PLASMA  LACTIC ACID, PLASMA  HIV ANTIBODY (ROUTINE TESTING W REFLEX)  CBC  CREATININE, SERUM    EKG None  Radiology No results found.                    Procedures Procedures (including critical care time)  Medications Ordered in ED Medications  metroNIDAZOLE (FLAGYL) IVPB 500 mg (has no administration in time  range)  levofloxacin (LEVAQUIN) IVPB 750 mg (has no administration in time range)  vancomycin (VANCOCIN) IVPB 1000 mg/200 mL premix (has no administration in time range)  famotidine (PEPCID) tablet 20 mg (has no administration in time range)  ibuprofen (ADVIL,MOTRIN) tablet 800 mg (has no administration in time range)  Buprenorphine HCl-Naloxone HCl 8-2 MG FILM 1 Film (has no administration in time range)  heparin injection 5,000 Units (has no administration in time range)  dextrose 5 %-0.45 % sodium chloride infusion (has no administration in time range)  sodium chloride 0.9 % bolus 1,000 mL (0 mLs Intravenous Stopped 02/07/19 1459)  metroNIDAZOLE (FLAGYL) IVPB 500 mg (0 mg Intravenous Stopped 02/07/19 1419)  vancomycin (VANCOCIN) IVPB 1000 mg/200 mL premix (0 mg Intravenous Stopped 02/07/19 1502)  levofloxacin (LEVAQUIN) IVPB 750 mg (0 mg Intravenous Stopped 02/07/19 1607)  HYDROmorphone (DILAUDID) injection 1 mg (1 mg Intravenous Given 02/07/19 1317)  sodium chloride 0.9 % bolus 1,000 mL (0 mLs Intravenous Stopped 02/07/19 1607)     Initial Impression / Assessment and Plan / ED Course  I have reviewed the triage vital signs and the nursing notes.  Pertinent labs & imaging results that were available during my care of the patient were reviewed by me and considered in my medical decision making (see chart for details).  Clinical Course as of Feb 06 1642  Wed Feb 07, 2019  1115 Spoke with Dr. Jeralyn Ruths, radiologist. We reviewed the CT neck performed yesterday, which I was able to view in PACS. He states he does not think further imaging is necessary as the available images seem to capture the full edges of abscess and associated edema.   [SJ]  1200 Spoke with Dr. Janace Hoard, ENT.  States patient can be admitted to [facility] via the hospitalist service.  Start vancomycin and Zosyn.  States they will sometimes do a trial of antibiotics to see if the area of  infection responds instead of going directly to  surgery.   [SJ]  1879 28 year old male with history of IVDA here with left neck pain swelling since last Thursday.  He was seen at [facility] him had a CT that showed a fluid collection.  He was referred to [facility] but ended up leaving there.  He is complaining of severe pain and inability to turn his head secondary to the pain.  He is quite tender and indurated in that area with some overlying erythema.  He is getting some antibiotics lab work with consulted ENT and he will be admitted over to [facility] on a medical service.   [MB]  1218 Spoke with Sharyn Lull, pharmacist, for advice on ABX alternatives due to patient's allergy list.  Recommends Levaquin, Flagyl, and vancomycin.   [SJ]  79 Spoke with Dr. Verlon Au, hospitalist. States there is no reason why patient can not be admitted by ENT directly. If they have any further questions or concerns,    [SJ]  [place] with Dr. Janace Hoard again to pass the message along from Dr. Verlon Au. States he will work on things from his end.   [SJ]  5784 I checked on the patient and he is standing on the bedside stating "I'm just trying to make all this stuff more comfortable." Indicating the cardiac monitor wires and IV tubing.   BP(!): 98/55 [SJ]  1459 Followed up with Dr. Janace Hoard again. States he is coordinating with head of the hospitalist team on this matter. Adds patient can be NPO after midnight as surgery is unlikely tonight.   [SJ]  [place] Dr. Janace Hoard called back. States he will likely come see the patient here.   [SJ]  18 Spoke with Dr. Verlon Au. States he will contact and communicate with Dr. Janace Hoard directly.   [SJ]  1604 Dr. Verlon Au states he will be admitting the patient.   [SJ]    Clinical Course User Index [MB] Hayden Rasmussen, MD [SJ] Lorayne Bender, PA-C       Patient presents with neck pain.  Abscess noted via CT at [facility].  No noted airway compromise.  Patient is nontoxic appearing, afebrile, not tachycardic, not tachypneic, not hypotensive,  maintains excellent SPO2 on room air, and is in no apparent distress.  Despite patient's denial of IV drug use, physical exam findings suggest otherwise.  Patient admitted for IV antibiotics and further management.  Findings and plan of care discussed with Ronnald Ramp, MD. Dr. Melina Copa personally evaluated and examined this patient.  Vitals:   02/07/19 1545 02/07/19 1600 02/07/19 1615 02/07/19 1630  BP:  108/68  114/67  Pulse: 86 85 76 83  Resp: 16 15 (!) 27 20  Temp:      TempSrc:      SpO2: 96% 96% 98% 100%  Weight:      Height:         Final Clinical Impressions(s) / ED Diagnoses   Final diagnoses:  Neck abscess    ED Discharge Orders    None       Layla Maw 02/07/19 1643    Hayden Rasmussen, MD 02/07/19 1818

## 2019-02-07 NOTE — ED Notes (Signed)
Pt states he has a hard time breathing, and can't swallow due to pain.

## 2019-02-07 NOTE — ED Notes (Signed)
[facility] called for transport. 

## 2019-02-07 NOTE — Progress Notes (Addendum)
Pharmacy Antibiotic Note  [name] is a 28 y.o. male admitted on 02/07/2019 with neck abscess.  Pharmacy has been consulted for vancomycin dosing.  Plan: Per MD:  Levaquin 750 mg iv q 24h.  Flagyl 500 mg iv q 8h.  Vancomycin 1000 mg iv once.   Will initiate vancomycin 1000 mg IV Q 12 hrs. Goal AUC 400-550. Expected AUC: 530 SCr used: 0.83 (to make CrCl ~ 120 ml/min)   Height: 5\' 6"  (167.6 cm) Weight: 162 lb (73.5 kg) IBW/kg (Calculated) : 63.8  Temp (24hrs), Avg:98.1 F (36.7 C), Min:98.1 F (36.7 C), Max:98.1 F (36.7 C)  Recent Labs  Lab 02/07/19 1209 02/07/19 1211  WBC 6.3  --   CREATININE 0.66  --   LATICACIDVEN  --  0.7    Estimated Creatinine Clearance: 122.9 mL/min (by C-G formula based on SCr of 0.66 mg/dL).    Allergies  Allergen Reactions  . Amoxicillin     Steven-Johnson's Syndrome  . Ceclor [Cefaclor] Other (See Comments)    Steven-Johnson's Syndrome  . Erythromycin     Steven-Johnson's Syndrome  . Penicillins     Steven-Johnson's Syndrome  . Septra [Sulfamethoxazole-Trimethoprim]     Steven-Johnson's Syndrome  . Tylenol [Acetaminophen]     Antibiotics: 3/11 Levaquin >> 3/11 Flagyl >> 3/11 vancomycin >>   Dose adjustments this admission:   Microbiology results:  3/11 BCx: sent  Thank you for allowing pharmacy to be a part of this patient's care.  Ulice Dash D 02/07/2019 5:02 PM

## 2019-02-07 NOTE — H&P (Signed)
HPI  RENALD Wilson DXA:[id number] DOB: Jul 07, 1991 DOA: 02/07/2019  PCP: [name]., MD   Chief Complaint: Neck pain  HPI:  28 year old Caucasian male Known history of polysubstance abuse-states he injects Suboxone? Opiate addiction on Suboxone gets his meds in [facility] History of hepatitis C Prior abscesses in forearm Tobacco abuse  Presented initially to [facility] on 3/10 2020-he had initially neck pain found on CT scan to have a 2.3 cm abscess in the left neck(Extensive soft tissue swelling left supraclavicular region, central rim-enhancing fluid collection 2.3 cm compatible with abscess collection 0.9 left jugular vein due to edema without thrombosis left lateral pharyngeal wall edema left)  Went to [facility] from [facility] 3/10 signed out [place]  He has been feeling poorly having some chest pain and has had some malaise no fevers no chills He is able to protect his airway has no obvious respiratory distress no signs of impending airway  ED Course: Kept n.p.o. given Dilaudid x1 started on Levaquin Flagyl and vancomycin ENT consulted  Review of Systems:   Negative for fever, visual changes, sore throat, rash, new muscle aches, chest pain, SOB, dysuria, bleeding, n/v/abdominal pain.  Past Medical History:  Diagnosis Date  . Hepatitis C   . History of Stevens-Johnson toxic epidermal necrolysis overlap syndrome    with PCN, not with keflex  . 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 TYMPANOSTOMY TUBE PLACEMENT     43-88 years old     reports that he has been smoking. He has been smoking about 0.00 packs per day for the past 5.00 years. He has never used smokeless tobacco. He reports current drug use. Drug: Marijuana. He reports that he does not drink alcohol. Mobility: Independent at baseline  Allergies  Allergen Reactions  . Amoxicillin    Steven-Johnson's Syndrome  . Ceclor [Cefaclor] Other (See Comments)    Steven-Johnson's Syndrome  . Erythromycin     Steven-Johnson's Syndrome  . Penicillins     Steven-Johnson's Syndrome  . Septra [Sulfamethoxazole-Trimethoprim]     Steven-Johnson's Syndrome  . Tylenol [Acetaminophen]     Family History  Problem Relation Age of Onset  . Diabetes Other        grandparents  . Stroke Other   . Pancreatic cancer Other   . Diabetes Mellitus II Other      Prior to Admission medications   Medication Sig Start Date End Date Taking? Authorizing Provider  ibuprofen (ADVIL,MOTRIN) 200 MG tablet Take 800 mg by mouth every 6 (six) hours as needed for headache.   Yes [provider]  SUBOXONE 8-2 MG FILM Place 1 Film under the tongue 2 (two) times daily. 01/30/19  Yes [provider]  clindamycin (CLEOCIN) 300 MG capsule Take 1 capsule (300 mg total) by mouth 3 (three) times daily. Patient not taking: Reported on 02/07/2019 10/19/14   Manuel Griffins, MD  diphenhydrAMINE (BENADRYL) 25 MG tablet Take 1 tablet (25 mg total) by mouth every 6 (six) hours as needed for itching (Rash). Patient not taking: Reported on 02/07/2019 09/24/14   Wilson, Manuel Soho, PA-C  famotidine (PEPCID) 20 MG tablet Take 1 tablet (20 mg total) by mouth 2 (two) times daily. Patient not taking: Reported on 02/07/2019 09/24/14   Wilson, Manuel Soho, PA-C  oxyCODONE (ROXICODONE) 5 MG immediate release tablet Take 1 tablet (5 mg total) by mouth every 4 (four) hours as needed for  severe pain. Patient not taking: Reported on 02/07/2019 10/19/14   Manuel Griffins, MD    Physical Exam:  Vitals:   02/07/19 1545 02/07/19 1600  BP:  108/68  Pulse: 86 85  Resp: 16 15  Temp:    SpO2: 96% 96%     Awake but slightly somnolent Caucasian male no apparent distress EOMI NCAT  Left neck is visibly more swollen than the right side he does have some submandibular and lateral neck tenderness he has no  fluctuant his exam is out of proportion to objective physical findings  Chest is clinically clear without added sound  Abdomen is soft  Power is 5/5  Throat is clear  Neurologically is intact  Skin is soft supple without any swelling  Psych is euthymic but is sleepy  I have personally reviewed following labs and imaging studies  Labs:   Chem-7 is essentially normal except for glucose 102 protein 8.5  Lactic acid normal WBC normal hemoglobin normal  Drug screen positive for BZD alcohol level less than 10  Imaging studies:   Cannot access however report noted in the emergency room chart  Medical tests:   EKG independently reviewed: None  Test discussed with performing physician:  Yes--I had a conversation with [name] of ENT who agrees to take over management in case there is a need for surgical intervention  Decision to obtain old records:   S  Review and summation of old records:   Yes  Active Problems:   * No active hospital problems. *   Assessment/Plan Neck abscess Patient is allergic to penicillin which caused s Stevens-Johnson syndrome in the past 3 we will keep him on vancomycin Flagyl and Levaquin at this time he may require clindamycin to consolidate his meds and I have spoken with [name] of ENT who is agreeable to seeing the patient over at [facility] request that he is transferred there and has agreed that if patient does go to surgery he will manage his care and we can follow along if needed for medical concerns Will be placed on D5 half-normal saline 75 cc/h as will be n.p.o. until seen by ENT Reported history of hepatitis C Unclear if ever treated he does inject I do not think that he is a good candidate at this time given high risk behavior for curative therapy given likelihood of him relapsing Tobacco abuse We had a brief discussion about cessation efforts I will place him on nicotine patch Polysubstance abuse We will resume his  Suboxone and will offer non-opioid other modalities for pain management such as ibuprofen If needed start Toradol    Severity of Illness: The appropriate patient status for this patient is INPATIENT. Inpatient status is judged to be reasonable and necessary in order to provide the required intensity of service to ensure the patient's safety. The patient's presenting symptoms, physical exam findings, and initial radiographic and laboratory data in the context of their chronic comorbidities is felt to place them at high risk for further clinical deterioration. Furthermore, it is not anticipated that the patient will be medically stable for discharge from the hospital within 2 midnights of admission. The following factors support the patient status of inpatient.   " The patient's presenting symptoms include neck abscess. " The worrisome physical exam findings include swelling of the neck. " The initial radiographic and laboratory data are worrisome because of area of swelling and possibility for hemodynamic and respiratory compromise. " The chronic co-morbidities include drug abuse.   *  I certify that at the point of admission it is my clinical judgment that the patient will require inpatient hospital care spanning beyond 2 midnights from the point of admission due to high intensity of service, high risk for further deterioration and high frequency of surveillance required.*     DVT prophylaxis: Lovenox Code Status: Presumed full Family Communication: Girlfriend Consults called: ENT-patient going to [facility] for management  Time spent: 65 minutes  Verlon Au, MD  [facility] Direct contact: [phone] --Via amion app OR  --[email or web address]; password [email or web address]  7PM-7AM contact night coverage as above  02/07/2019, 4:15 PM

## 2019-02-07 NOTE — Consult Note (Signed)
Reason for Consult left neck abscess Referring Physician: er  [name] is an 28 y.o. male.  HPI: hx of 1 week with left neck swelling. He injects IV drugs into his jugular veins and did the left side about week ago. He has increasing swelling and tenderness and underwent CT scan at [facility] and had a left supraclavicular abscess. He now presents here after being sent to [facility] yesterday and left AMA.   Past Medical History:  Diagnosis Date  . Hepatitis C   . History of Stevens-Johnson toxic epidermal necrolysis overlap syndrome    with PCN, not with keflex  . Opiate addiction ([place])    on methadone    Past Surgical History:  Procedure Laterality Date  . TYMPANOSTOMY TUBE PLACEMENT     68-33 years old    Family History  Problem Relation Age of Onset  . Diabetes Other        grandparents  . Stroke Other   . Pancreatic cancer Other   . Diabetes Mellitus II Other     Social History:  reports that he has been smoking. He has been smoking about 0.00 packs per day for the past 5.00 years. He has never used smokeless tobacco. He reports current drug use. Drug: Marijuana. He reports that he does not drink alcohol.  Allergies:  Allergies  Allergen Reactions  . Amoxicillin     Steven-Johnson's Syndrome  . Ceclor [Cefaclor] Other (See Comments)    Steven-Johnson's Syndrome  . Erythromycin     Steven-Johnson's Syndrome  . Penicillins     Steven-Johnson's Syndrome  . Septra [Sulfamethoxazole-Trimethoprim]     Steven-Johnson's Syndrome  . Tylenol [Acetaminophen]     Medications: I have reviewed the patient's current medications.  Results for orders placed or performed during the hospital encounter of 02/07/19 (from the past 48 hour(s))  Urine rapid drug screen (hosp performed)     Status: Abnormal   Collection Time: 02/07/19 11:35 AM  Result Value Ref Range   Opiates NONE DETECTED NONE DETECTED   Cocaine NONE DETECTED NONE DETECTED   Benzodiazepines POSITIVE (A) NONE  DETECTED   Amphetamines NONE DETECTED NONE DETECTED   Tetrahydrocannabinol NONE DETECTED NONE DETECTED   Barbiturates NONE DETECTED NONE DETECTED    Comment: (NOTE) DRUG SCREEN FOR MEDICAL PURPOSES ONLY.  IF CONFIRMATION IS NEEDED FOR ANY PURPOSE, NOTIFY LAB WITHIN 5 DAYS. LOWEST DETECTABLE LIMITS FOR URINE DRUG SCREEN Drug Class                     Cutoff (ng/mL) Amphetamine and metabolites    1000 Barbiturate and metabolites    200 Benzodiazepine                 921 Tricyclics and metabolites     300 Opiates and metabolites        300 Cocaine and metabolites        300 THC                            50 Performed at [facility], [place] [address], [place], [place] [address]   Comprehensive metabolic panel     Status: Abnormal   Collection Time: 02/07/19 12:09 PM  Result Value Ref Range   Sodium 137 135 - 145 mmol/L   Potassium 3.8 3.5 - 5.1 mmol/L   Chloride 99 98 - 111 mmol/L   CO2 29 22 - 32 mmol/L  Glucose, Bld 102 (H) 70 - 99 mg/dL   BUN 9 6 - 20 mg/dL   Creatinine, Ser 0.66 0.61 - 1.24 mg/dL   Calcium 9.4 8.9 - 10.3 mg/dL   Total Protein 8.5 (H) 6.5 - 8.1 g/dL   Albumin 4.5 3.5 - 5.0 g/dL   AST 20 15 - 41 U/L   ALT 12 0 - 44 U/L   Alkaline Phosphatase 90 38 - 126 U/L   Total Bilirubin 0.3 0.3 - 1.2 mg/dL   GFR calc non Af Amer >60 >60 mL/min   GFR calc Af Amer >60 >60 mL/min   Anion gap 9 5 - 15    Comment: Performed at [facility], [place] [address] [place], [place] [address]  CBC with Differential     Status: None   Collection Time: 02/07/19 12:09 PM  Result Value Ref Range   WBC 6.3 4.0 - 10.5 K/uL   RBC 4.64 4.22 - 5.81 MIL/uL   Hemoglobin 13.3 13.0 - 17.0 g/dL   HCT 41.0 39.0 - 52.0 %   MCV 88.4 80.0 - 100.0 fL   MCH 28.7 26.0 - 34.0 pg   MCHC 32.4 30.0 - 36.0 g/dL   RDW 12.7 11.5 - 15.5 %   Platelets 317 150 - 400 K/uL   nRBC 0.0 0.0 - 0.2 %   Neutrophils Relative % 60 %   Neutro Abs 3.8 1.7 - 7.7 K/uL    Lymphocytes Relative 27 %   Lymphs Abs 1.7 0.7 - 4.0 K/uL   Monocytes Relative 7 %   Monocytes Absolute 0.4 0.1 - 1.0 K/uL   Eosinophils Relative 5 %   Eosinophils Absolute 0.3 0.0 - 0.5 K/uL   Basophils Relative 1 %   Basophils Absolute 0.0 0.0 - 0.1 K/uL   Immature Granulocytes 0 %   Abs Immature Granulocytes 0.01 0.00 - 0.07 K/uL    Comment: Performed at [facility], [place] [address], [place], [place] [address]  Ethanol     Status: None   Collection Time: 02/07/19 12:11 PM  Result Value Ref Range   Alcohol, Ethyl (B) <10 <10 mg/dL    Comment: (NOTE) Lowest detectable limit for serum alcohol is 10 mg/dL. For medical purposes only. Performed at [facility], [place] [address] [place], [place] [address]   Lactic acid, plasma     Status: None   Collection Time: 02/07/19 12:11 PM  Result Value Ref Range   Lactic Acid, Venous 0.7 0.5 - 1.9 mmol/L    Comment: Performed at [facility], [place] [address] [place], [place] [address]    No results found.  Review of Systems  Constitutional: Negative.   HENT: Negative.   Eyes: Negative.   Skin: Negative.    Blood pressure 108/68, pulse 85, temperature 98.1 F (36.7 C), temperature source Oral, resp. rate 15, height 5\' 6"  (1.676 m), weight 73.5 kg, SpO2 96 %. Physical Exam  Constitutional: He appears well-developed and well-nourished.  HENT:  Head: Normocephalic and atraumatic.  Nose: Nose normal.  Mouth/Throat: Oropharynx is clear and moist.  Eyes: Pupils are equal, round, and reactive to light. Conjunctivae are normal.  Neck:  Left has a discreet mass in the lower neck/supraclavicular area that is tender and only slight erythema. It is about 2-3 cm    Assessment/Plan: Left neck abscess- he has about 2 cm and may respond to IV abx. Would use Vancomycin and Zosyn. He will have I/D if not responding  in 24-48 hours. NPO after MIdnight  Melissa Montane 02/07/2019, 4:33 PM

## 2019-02-07 NOTE — Progress Notes (Signed)
[facility] notified patient states 10/10 neck pain. Arthor Captain LPN

## 2019-02-08 DIAGNOSIS — F1910 Other psychoactive substance abuse, uncomplicated: Secondary | ICD-10-CM

## 2019-02-08 LAB — HIV ANTIBODY (ROUTINE TESTING W REFLEX): HIV Screen 4th Generation wRfx: NONREACTIVE

## 2019-02-08 MED ORDER — KETOROLAC TROMETHAMINE 30 MG/ML IJ SOLN
30.0000 mg | Freq: Four times a day (QID) | INTRAMUSCULAR | Status: DC | PRN
  Administered 2019-02-08 – 2019-02-09 (×3): 30 mg via INTRAVENOUS
  Filled 2019-02-08 (×3): qty 1

## 2019-02-08 NOTE — Plan of Care (Signed)
   Problem: Pain Managment: Goal: General experience of comfort will improve Outcome: Progressing   Problem: Safety: Goal: Ability to remain free from injury will improve Outcome: Progressing   Problem: Coping: Goal: Level of anxiety will decrease Outcome: Progressing   Problem: Safety: Goal: Ability to remain free from injury will improve Outcome: Progressing

## 2019-02-08 NOTE — Progress Notes (Signed)
The smell of smokef was noticed in the room NT stated that she saw ashes on floor in bathroom. Patient denied smoking in the room informed that this is no smoking and that Oxygen and other gases are present in room making smoking a safety hazard. Arthor Captain LPN.

## 2019-02-08 NOTE — Progress Notes (Signed)
Patient ID: [name], male   DOB: [birth year], 28 y.o.   MRN: [id number]  PROGRESS NOTE    [name]  FOY:[id number] DOB: 1991/02/26 DOA: 02/07/2019 PCP: Enid Skeens., MD   Brief Narrative:  28 year old male with history of polysubstance abuse, ?  States that he inject Suboxone, opiate addiction on Suboxone, history of hepatitis C, prior forearm abscesses, tobacco abuse presented initially to [facility] on 02/06/2019 with left neck abscess; subsequently went to [facility] and then signed out [place].  He presented on 02/07/2019 with neck pain.  ENT was consulted.  He was started on broad-spectrum antibiotics.  Assessment & Plan:   Active Problems:   Neck abscess  Neck abscess -Currently on IV Levaquin, Flagyl and vancomycin.  Dr. Verita Schneiders following.  Follow further recommendations.  No plans for any surgical intervention today.  We will put him on a diet.  Reported history of hepatitis C -Outpatient follow-up with PCP.  Patient is to stop illicit drug use  Tobacco abuse -Continue nicotine patch.  Patient was counseled about cessation on admission  Polysubstance abuse -Continue Suboxone.  Continue other non-opioid medications for pain management including ibuprofen/Toradol.   DVT prophylaxis: Heparin subcutaneous Code Status: Full Family Communication: none at bedside  Disposition Plan: Home once cleared by ENT  Consultants: ENT  Procedures: None   Antimicrobials: Levaquin, Flagyl and vancomycin from 02/07/2019 onwards   Subjective: Patient seen and examined at bedside.  He is sleepy, wakes up on calling his name.  Denies worsening neck pain.  No overnight fever or vomiting.  Objective: Vitals:   02/07/19 1930 02/07/19 2012 02/07/19 2101 02/08/19 0458  BP: (!) 103/56 (!) 101/52 107/60 (!) 92/49  Pulse: 88 90 86 74  Resp: 20 15 18 18   Temp:   98.1 F (36.7 C) 97.8 F (36.6 C)  TempSrc:   Oral   SpO2: 96% 99% 100% 99%   Weight:   73.8 kg   Height:       No intake or output data in the 24 hours ending 02/08/19 1340 Filed Weights   02/07/19 0849 02/07/19 2101  Weight: 73.5 kg 73.8 kg    Examination:  General exam: Appears calm and comfortable.  Sleepy, wakes up on calling his name. Neck: Left lower neck with erythema and induration and slight tenderness.  No discharge. Respiratory system: Bilateral decreased breath sounds at bases Cardiovascular system: S1 & S2 heard, Rate controlled Gastrointestinal system: Abdomen is nondistended, soft and nontender. Normal bowel sounds heard. Extremities: No cyanosis, clubbing, edema    Data Reviewed: I have personally reviewed following labs and imaging studies  CBC: Recent Labs  Lab 02/07/19 1209  WBC 6.3  NEUTROABS 3.8  HGB 13.3  HCT 41.0  MCV 88.4  PLT 767   Basic Metabolic Panel: Recent Labs  Lab 02/07/19 1209  NA 137  K 3.8  CL 99  CO2 29  GLUCOSE 102*  BUN 9  CREATININE 0.66  CALCIUM 9.4   GFR: Estimated Creatinine Clearance: 122.9 mL/min (by C-G formula based on SCr of 0.66 mg/dL). Liver Function Tests: Recent Labs  Lab 02/07/19 1209  AST 20  ALT 12  ALKPHOS 90  BILITOT 0.3  PROT 8.5*  ALBUMIN 4.5   No results for input(s): LIPASE, AMYLASE in the last 168 hours. No results for input(s): AMMONIA in the last 168 hours. Coagulation Profile: No results for input(s): INR, PROTIME in the last 168 hours. Cardiac Enzymes: No results for input(s): CKTOTAL,  CKMB, CKMBINDEX, TROPONINI in the last 168 hours. BNP (last 3 results) No results for input(s): PROBNP in the last 8760 hours. HbA1C: No results for input(s): HGBA1C in the last 72 hours. CBG: No results for input(s): GLUCAP in the last 168 hours. Lipid Profile: No results for input(s): CHOL, HDL, LDLCALC, TRIG, CHOLHDL, LDLDIRECT in the last 72 hours. Thyroid Function Tests: No results for input(s): TSH, T4TOTAL, FREET4, T3FREE, THYROIDAB in the last 72 hours. Anemia  Panel: No results for input(s): VITAMINB12, FOLATE, FERRITIN, TIBC, IRON, RETICCTPCT in the last 72 hours. Sepsis Labs: Recent Labs  Lab 02/07/19 1211  LATICACIDVEN 0.7    Recent Results (from the past 240 hour(s))  Culture, blood (routine x 2)     Status: None (Preliminary result)   Collection Time: 02/07/19 11:55 AM  Result Value Ref Range Status   Specimen Description   Final    BLOOD BLOOD LEFT FOREARM Performed at [place] [address], [place], [place] [address]    Special Requests   Final    BOTTLES DRAWN AEROBIC AND ANAEROBIC Blood Culture results may not be optimal due to an excessive volume of blood received in culture bottles Performed at [place] [address] [place], [place] [address]    Culture   Final    NO GROWTH < 24 HOURS Performed at [facility] [address], [place], [place] [address]    Report Status PENDING  Incomplete  Culture, blood (routine x 2)     Status: None (Preliminary result)   Collection Time: 02/07/19 12:12 PM  Result Value Ref Range Status   Specimen Description   Final    BLOOD BLOOD LEFT FOREARM Performed at [place] [address], [place], [place] [address]    Special Requests   Final    BOTTLES DRAWN AEROBIC AND ANAEROBIC Blood Culture adequate volume Performed at [place] [address], [place], [place] [address]    Culture   Final    NO GROWTH < 24 HOURS Performed at [facility] [address] [place],  [address]    Report Status PENDING  Incomplete         Radiology Studies: No results found.      Scheduled Meds: . buprenorphine-naloxone  1 tablet Sublingual Daily  . famotidine  20 mg Oral BID  . heparin  5,000 Units Subcutaneous Q8H  . nicotine  14 mg Transdermal QHS   Continuous Infusions: . dextrose 5 % and 0.45% NaCl 75 mL/hr at 02/08/19 0009  . levofloxacin (LEVAQUIN) IV    .  metronidazole 500 mg (02/08/19 8032)  . vancomycin 1,000 mg (02/08/19 0119)     LOS: 1 day        Aline August, MD [facility] 02/08/2019, 1:40 PM

## 2019-02-08 NOTE — Progress Notes (Signed)
Patient ID: [name], male   DOB: [birth year], 28 y.o.   MRN: [id number] Patient much better. He can move his neck.   Exam- the area of left lower neck still with induation but less tender. No worsening of swelling.   He is improving so continue IV antibiotics and regular diet. Will continue to check progress

## 2019-02-09 LAB — CBC WITH DIFFERENTIAL/PLATELET
Abs Immature Granulocytes: 0.01 10*3/uL (ref 0.00–0.07)
Basophils Absolute: 0 10*3/uL (ref 0.0–0.1)
Basophils Relative: 1 %
Eosinophils Absolute: 0.3 10*3/uL (ref 0.0–0.5)
Eosinophils Relative: 6 %
HCT: 33.6 % — ABNORMAL LOW (ref 39.0–52.0)
Hemoglobin: 10.7 g/dL — ABNORMAL LOW (ref 13.0–17.0)
Immature Granulocytes: 0 %
Lymphocytes Relative: 37 %
Lymphs Abs: 1.6 10*3/uL (ref 0.7–4.0)
MCH: 27.6 pg (ref 26.0–34.0)
MCHC: 31.8 g/dL (ref 30.0–36.0)
MCV: 86.8 fL (ref 80.0–100.0)
Monocytes Absolute: 0.4 10*3/uL (ref 0.1–1.0)
Monocytes Relative: 9 %
Neutro Abs: 2 10*3/uL (ref 1.7–7.7)
Neutrophils Relative %: 47 %
PLATELETS: 264 10*3/uL (ref 150–400)
RBC: 3.87 MIL/uL — AB (ref 4.22–5.81)
RDW: 12.3 % (ref 11.5–15.5)
WBC: 4.3 10*3/uL (ref 4.0–10.5)
nRBC: 0 % (ref 0.0–0.2)

## 2019-02-09 LAB — BASIC METABOLIC PANEL
Anion gap: 5 (ref 5–15)
BUN: 9 mg/dL (ref 6–20)
CO2: 27 mmol/L (ref 22–32)
CREATININE: 0.59 mg/dL — AB (ref 0.61–1.24)
Calcium: 8.5 mg/dL — ABNORMAL LOW (ref 8.9–10.3)
Chloride: 107 mmol/L (ref 98–111)
GFR calc Af Amer: >60 mL/min (ref >60)
GFR calc non Af Amer: >60 mL/min (ref >60)
Glucose, Bld: 106 mg/dL — ABNORMAL HIGH (ref 70–99)
Potassium: 3.6 mmol/L (ref 3.5–5.1)
Sodium: 139 mmol/L (ref 135–145)

## 2019-02-09 LAB — MAGNESIUM: Magnesium: 2 mg/dL (ref 1.7–2.4)

## 2019-02-09 LAB — C-REACTIVE PROTEIN: CRP: 3.5 mg/dL — ABNORMAL HIGH (ref <1.0)

## 2019-02-09 MED ORDER — CLINDAMYCIN HCL 300 MG PO CAPS: 600.0000 mg | ORAL_CAPSULE | Freq: Three times a day (TID) | ORAL | 0 refills | Status: AC

## 2019-02-09 NOTE — Progress Notes (Signed)
Pt given discharge instructions, prescriptions, and care notes. Pt verbalized understanding AEB no further questions or concerns at this time. Midline IV was discontinued, no redness, pain, or swelling noted at this time.  Pt left the floor ambulatory w/ his girlfriend in stable condition. Skin dry and intact. Pt in no distress.

## 2019-02-09 NOTE — Discharge Summary (Signed)
Physician Discharge Summary  [name] HQP:[id number] DOB: [birth year] DOA: 02/07/2019  PCP: Enid Skeens., MD  Admit date: 02/07/2019 Discharge date: 02/09/2019  Admitted From: Home Disposition: Home  Recommendations for Outpatient Follow-up:  1. Follow up with PCP in 1 week 2. Follow-up with ENT Dr. Janace Hoard as an outpatient 3. Abstain from illicit drug use 4. Outpatient follow-up with pain management 5. Follow up in ED if symptoms worsen or new appear   Home Health: No Equipment/Devices: None  Discharge Condition: Stable CODE STATUS: Full Diet recommendation: Regular  Brief/Interim Summary: 28 year old male with history of polysubstance abuse, ?  States that he inject Suboxone, opiate addiction on Suboxone, history of hepatitis C, prior forearm abscesses, tobacco abuse presented initially to [facility] on 02/06/2019 with left neck abscess; subsequently went to [facility] and then signed out [place].  He presented on 02/07/2019 with neck pain.  ENT was consulted.  He was started on broad-spectrum antibiotics.  His condition has improved.  ENT is okay for the patient to be discharged on oral antibiotics.  Discharge Diagnoses:  Active Problems:   Neck abscess  Neck abscess -Currently on IV Levaquin, Flagyl and vancomycin.  Dr. Verita Schneiders following.    ENT has cleared the patient for discharge on oral clindamycin.  Will discharge on clindamycin 600 mg 3 times a day for week.  Outpatient follow-up with Dr. Janace Hoard in a week or earlier if symptoms get worse.   Reported history of hepatitis C -Outpatient follow-up with PCP.  Patient is to stop illicit drug use  Tobacco abuse -Patient was counseled about cessation on admission  Polysubstance abuse -Continue Suboxone.    Outpatient follow-up with pain management provider.  Discharge Instructions  Discharge Instructions    Diet general   Complete by:  As directed    Increase activity slowly    Complete by:  As directed      Allergies as of 02/09/2019      Reactions   Amoxicillin    Steven-Johnson's Syndrome   Ceclor [cefaclor] Other (See Comments)   Steven-Johnson's Syndrome   Erythromycin    Steven-Johnson's Syndrome   Penicillins    Steven-Johnson's Syndrome   Septra [sulfamethoxazole-trimethoprim]    Steven-Johnson's Syndrome   Tylenol [acetaminophen]       Medication List    STOP taking these medications   diphenhydrAMINE 25 MG tablet Commonly known as:  Benadryl   famotidine 20 MG tablet Commonly known as:  Pepcid   oxyCODONE 5 MG immediate release tablet Commonly known as:  Roxicodone     TAKE these medications   clindamycin 300 MG capsule Commonly known as:  Cleocin Take 2 capsules (600 mg total) by mouth 3 (three) times daily for 7 days. What changed:  how much to take   ibuprofen 200 MG tablet Commonly known as:  ADVIL,MOTRIN Take 800 mg by mouth every 6 (six) hours as needed for headache.   Suboxone 8-2 MG Film Generic drug:  Buprenorphine HCl-Naloxone HCl Place 1 Film under the tongue 2 (two) times daily.      Follow-up Information    Slatosky, Marshall Cork., MD. Schedule an appointment as soon as possible for a visit in 1 week(s).   Specialty:  Family Medicine Contact information: [address] [address] [phone]        Melissa Montane, MD Follow up.   Specialty:  Otolaryngology Contact information: [address] [place] [address] [phone]  Allergies  Allergen Reactions  . Amoxicillin     Steven-Johnson's Syndrome  . Ceclor [Cefaclor] Other (See Comments)    Steven-Johnson's Syndrome  . Erythromycin     Steven-Johnson's Syndrome  . Penicillins     Steven-Johnson's Syndrome  . Septra [Sulfamethoxazole-Trimethoprim]     Steven-Johnson's Syndrome  . Tylenol [Acetaminophen]     Consultations:  ENT   Procedures/Studies:  No results found.    Subjective: Patient seen  and examined at bedside.  He feels better.  Denies any worsening neck pain.  No overnight fever or vomiting.  Discharge Exam: Vitals:   02/08/19 2237 02/09/19 0544  BP: (!) 95/57 (!) 94/55  Pulse: 86 74  Resp: 18 18  Temp: 98.1 F (36.7 C) 98.1 F (36.7 C)  SpO2: 100% 98%   Vitals:   02/08/19 0458 02/08/19 1350 02/08/19 2237 02/09/19 0544  BP: (!) 92/49 (!) 95/55 (!) 95/57 (!) 94/55  Pulse: 74 80 86 74  Resp: 18 20 18 18   Temp: 97.8 F (36.6 C) 97.7 F (36.5 C) 98.1 F (36.7 C) 98.1 F (36.7 C)  TempSrc:  Oral Oral Oral  SpO2: 99% 99% 100% 98%  Weight:      Height:        General: Pt is alert, awake, not in acute distress Neck: Left lower neck erythema and induration has markedly improved with improving tenderness.  No discharge Cardiovascular: rate controlled, S1/S2 + Respiratory: bilateral decreased breath sounds at bases Abdominal: Soft, NT, ND, bowel sounds + Extremities: no edema, no cyanosis    The results of significant diagnostics from this hospitalization (including imaging, microbiology, ancillary and laboratory) are listed below for reference.     Microbiology: Recent Results (from the past 240 hour(s))  Culture, blood (routine x 2)     Status: None (Preliminary result)   Collection Time: 02/07/19 11:55 AM  Result Value Ref Range Status   Specimen Description   Final    BLOOD BLOOD LEFT FOREARM Performed at [facility] [address], [place], [place] [address]    Special Requests   Final    BOTTLES DRAWN AEROBIC AND ANAEROBIC Blood Culture results may not be optimal due to an excessive volume of blood received in culture bottles Performed at [facility] [address], [place], [place] [address]    Culture   Final    NO GROWTH 1 DAY Performed at [facility], [place] [address] [place], [place] [address]    Report Status PENDING  Incomplete  Culture, blood (routine x 2)     Status: None (Preliminary  result)   Collection Time: 02/07/19 12:12 PM  Result Value Ref Range Status   Specimen Description   Final    BLOOD BLOOD LEFT FOREARM Performed at [facility] [address] [place], [place] [address]    Special Requests   Final    BOTTLES DRAWN AEROBIC AND ANAEROBIC Blood Culture adequate volume Performed at [facility] [address], [place], [place] [address]    Culture   Final    NO GROWTH 1 DAY Performed at [facility], [place] [address], [place], [place] [address]    Report Status PENDING  Incomplete     Labs: BNP (last 3 results) No results for input(s): BNP in the last 8760 hours. Basic Metabolic Panel: Recent Labs  Lab 02/07/19 1209 02/09/19 0405  NA 137 139  K 3.8 3.6  CL 99 107  CO2 29 27  GLUCOSE 102* 106*  BUN 9 9  CREATININE 0.66 0.59*  CALCIUM 9.4 8.5*  MG  --  2.0   Liver Function Tests: Recent Labs  Lab 02/07/19 1209  AST 20  ALT 12  ALKPHOS 90  BILITOT 0.3  PROT 8.5*  ALBUMIN 4.5   No results for input(s): LIPASE, AMYLASE in the last 168 hours. No results for input(s): AMMONIA in the last 168 hours. CBC: Recent Labs  Lab 02/07/19 1209 02/09/19 0405  WBC 6.3 4.3  NEUTROABS 3.8 2.0  HGB 13.3 10.7*  HCT 41.0 33.6*  MCV 88.4 86.8  PLT 317 264   Cardiac Enzymes: No results for input(s): CKTOTAL, CKMB, CKMBINDEX, TROPONINI in the last 168 hours. BNP: Invalid input(s): POCBNP CBG: No results for input(s): GLUCAP in the last 168 hours. D-Dimer No results for input(s): DDIMER in the last 72 hours. Hgb A1c No results for input(s): HGBA1C in the last 72 hours. Lipid Profile No results for input(s): CHOL, HDL, LDLCALC, TRIG, CHOLHDL, LDLDIRECT in the last 72 hours. Thyroid function studies No results for input(s): TSH, T4TOTAL, T3FREE, THYROIDAB in the last 72 hours.  Invalid input(s): FREET3 Anemia work up No results for input(s): VITAMINB12, FOLATE, FERRITIN, TIBC, IRON, RETICCTPCT  in the last 72 hours. Urinalysis    Component Value Date/Time   COLORURINE AMBER (A) 10/16/2014 1955   APPEARANCEUR CLEAR 10/16/2014 1955   LABSPEC 1.023 10/16/2014 1955   PHURINE 7.5 10/16/2014 1955   GLUCOSEU NEGATIVE 10/16/2014 1955   HGBUR NEGATIVE 10/16/2014 1955   BILIRUBINUR NEGATIVE 10/16/2014 1955   KETONESUR NEGATIVE 10/16/2014 1955   PROTEINUR NEGATIVE 10/16/2014 1955   UROBILINOGEN 4.0 (H) 10/16/2014 1955   NITRITE NEGATIVE 10/16/2014 1955   LEUKOCYTESUR NEGATIVE 10/16/2014 1955   Sepsis Labs Invalid input(s): PROCALCITONIN,  WBC,  LACTICIDVEN Microbiology Recent Results (from the past 240 hour(s))  Culture, blood (routine x 2)     Status: None (Preliminary result)   Collection Time: 02/07/19 11:55 AM  Result Value Ref Range Status   Specimen Description   Final    BLOOD BLOOD LEFT FOREARM Performed at [facility], [place] [address] [place], [place] [address]    Special Requests   Final    BOTTLES DRAWN AEROBIC AND ANAEROBIC Blood Culture results may not be optimal due to an excessive volume of blood received in culture bottles Performed at [facility] [address], [place], [place] [address]    Culture   Final    NO GROWTH 1 DAY Performed at [facility], [place] [address] [place], [place] [address]    Report Status PENDING  Incomplete  Culture, blood (routine x 2)     Status: None (Preliminary result)   Collection Time: 02/07/19 12:12 PM  Result Value Ref Range Status   Specimen Description   Final    BLOOD BLOOD LEFT FOREARM Performed at [facility] [address], [place], [place] [address]    Special Requests   Final    BOTTLES DRAWN AEROBIC AND ANAEROBIC Blood Culture adequate volume Performed at [facility] [address], [place], [place] [address]    Culture   Final    NO GROWTH 1 DAY Performed at [facility], [place] [address], [place], [place] [address]     Report Status PENDING  Incomplete     Time coordinating discharge: 35 minutes  SIGNED:   Aline August, MD  [facility] 02/09/2019,  12:40 PM

## 2019-02-09 NOTE — Progress Notes (Signed)
   Subjective/Chief Complaint: He looks great and feels great   Objective: Vital signs in last 24 hours: Temp:  [97.7 F (36.5 C)-98.1 F (36.7 C)] 98.1 F (36.7 C) (03/13 0544) Pulse Rate:  [74-86] 74 (03/13 0544) Resp:  [18-20] 18 (03/13 0544) BP: (94-95)/(55-57) 94/55 (03/13 0544) SpO2:  [98 %-100 %] 98 % (03/13 0544) Last BM Date: (PTA)  Intake/Output from previous day: 03/12 0701 - 03/13 0700 In: 1311.2 [I.V.:809; IV Piggyback:502.2] Out: -  Intake/Output this shift: No intake/output data recorded.  he is eating and looks great. he no longer has neck pain and the mass area on the left is softer and less indurated. I can now examine thoughly without pain. no erythema  Lab Results:  Recent Labs    02/07/19 1209 02/09/19 0405  WBC 6.3 4.3  HGB 13.3 10.7*  HCT 41.0 33.6*  PLT 317 264   BMET Recent Labs    02/07/19 1209 02/09/19 0405  NA 137 139  K 3.8 3.6  CL 99 107  CO2 29 27  GLUCOSE 102* 106*  BUN 9 9  CREATININE 0.66 0.59*  CALCIUM 9.4 8.5*   PT/INR No results for input(s): LABPROT, INR in the last 72 hours. ABG No results for input(s): PHART, HCO3 in the last 72 hours.  Invalid input(s): PCO2, PO2  Studies/Results: No results found.  Anti-infectives: Anti-infectives (From admission, onward)   Start     Dose/Rate Route Frequency Ordered Stop   02/07/19 2100  vancomycin (VANCOCIN) 1,250 mg in sodium chloride 0.9 % 250 mL IVPB  Status:  Discontinued     1,250 mg 166.7 mL/hr over 90 Minutes Intravenous Every 12 hours 02/07/19 1707 02/07/19 1746   02/07/19 2100  vancomycin (VANCOCIN) IVPB 1000 mg/200 mL premix     1,000 mg 200 mL/hr over 60 Minutes Intravenous Every 12 hours 02/07/19 1746     02/07/19 1645  metroNIDAZOLE (FLAGYL) IVPB 500 mg     500 mg 100 mL/hr over 60 Minutes Intravenous Every 8 hours 02/07/19 1639     02/07/19 1645  levofloxacin (LEVAQUIN) IVPB 750 mg     750 mg 100 mL/hr over 90 Minutes Intravenous Every 24 hours  02/07/19 1639     02/07/19 1645  vancomycin (VANCOCIN) IVPB 1000 mg/200 mL premix  Status:  Discontinued     1,000 mg 200 mL/hr over 60 Minutes Intravenous Every 24 hours 02/07/19 1639 02/07/19 1659   02/07/19 1230  metroNIDAZOLE (FLAGYL) IVPB 500 mg     500 mg 100 mL/hr over 60 Minutes Intravenous  Once 02/07/19 1221 02/07/19 1419   02/07/19 1230  vancomycin (VANCOCIN) IVPB 1000 mg/200 mL premix     1,000 mg 200 mL/hr over 60 Minutes Intravenous  Once 02/07/19 1221 02/07/19 1502   02/07/19 1230  levofloxacin (LEVAQUIN) IVPB 750 mg     750 mg 100 mL/hr over 90 Minutes Intravenous  Once 02/07/19 1222 02/07/19 1607      Assessment/Plan: s/p * No surgery found * he looks like he has responded excellent to the IV abx and would think po clindamycin and home is appropraite. he will follow up in my office in 1 week sooner if any worsening of the area or pain.   LOS: 2 days    Melissa Montane 02/09/2019

## 2019-02-12 LAB — CULTURE, BLOOD (ROUTINE X 2)
CULTURE: NO GROWTH
CULTURE: NO GROWTH
SPECIAL REQUESTS: ADEQUATE

## 2019-04-09 ENCOUNTER — Emergency Department (HOSPITAL_COMMUNITY)
Admission: EM | Admit: 2019-04-09 | Discharge: 2019-04-09 | Disposition: A | Discharge status: Home / Self Care | Payer: Medicaid Other | Attending: Emergency Medicine | Admitting: Emergency Medicine

## 2019-04-09 ENCOUNTER — Emergency Department (HOSPITAL_COMMUNITY): Payer: Medicaid Other

## 2019-04-09 ENCOUNTER — Encounter (HOSPITAL_COMMUNITY): Payer: Self-pay

## 2019-04-09 ENCOUNTER — Other Ambulatory Visit: Payer: Self-pay

## 2019-04-09 ENCOUNTER — Emergency Department (HOSPITAL_BASED_OUTPATIENT_CLINIC_OR_DEPARTMENT_OTHER): Payer: Medicaid Other

## 2019-04-09 DIAGNOSIS — F1910 Other psychoactive substance abuse, uncomplicated: Secondary | ICD-10-CM

## 2019-04-09 DIAGNOSIS — F1120 Opioid dependence, uncomplicated: Secondary | ICD-10-CM | POA: Insufficient documentation

## 2019-04-09 DIAGNOSIS — M79641 Pain in right hand: Secondary | ICD-10-CM | POA: Diagnosis not present

## 2019-04-09 DIAGNOSIS — Z79899 Other long term (current) drug therapy: Secondary | ICD-10-CM | POA: Diagnosis not present

## 2019-04-09 DIAGNOSIS — F17210 Nicotine dependence, cigarettes, uncomplicated: Secondary | ICD-10-CM | POA: Insufficient documentation

## 2019-04-09 DIAGNOSIS — I998 Other disorder of circulatory system: Secondary | ICD-10-CM | POA: Diagnosis not present

## 2019-04-09 NOTE — Consult Note (Signed)
Referring Physician: Dr Marijean Bravo  Patient name: [name] MRN: [id number] DOB: [birth year] Sex: male  REASON FOR CONSULT: right hand ischemia  HPI: [name] is a 28 y.o. male, who admits to injecting drugs just above his right wrist.  He has had several day history of pain in the right thumb and wrist worse at night.  He denies any other extremity involvement.  He has had a prior neck abscess from IVDA.   Past Medical History:  Diagnosis Date  . Hepatitis C   . History of Stevens-Johnson toxic epidermal necrolysis overlap syndrome    with PCN, not with keflex  . 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 TYMPANOSTOMY TUBE PLACEMENT     90-73 years old    Family History  Problem Relation Age of Onset  . Diabetes Other        grandparents  . Stroke Other   . Pancreatic cancer Other   . Diabetes Mellitus II Other     SOCIAL HISTORY: Social History   Socioeconomic History  . Marital status: Married    Spouse name: Not on file  . Number of children: Not on file  . Years of education: Not on file  . Highest education level: Not on file  Occupational History  . Not on file  Social Needs  . Financial resource strain: Not on file  . Food insecurity:    Worry: Not on file    Inability: Not on file  . Transportation needs:    Medical: Not on file    Non-medical: Not on file  Tobacco Use  . Smoking status: Current Every Day Smoker    Packs/day: 0.00    Years: 5.00    Pack years: 0.00  . Smokeless tobacco: Never Used  Substance and Sexual Activity  . Alcohol use: No  . Drug use: Yes    Types: Marijuana  . Sexual activity: Not on file  Lifestyle  . Physical activity:    Days per week: Not on file    Minutes per session: Not on file  . Stress: Not on file  Relationships  . Social connections:    Talks on phone: Not on file    Gets together: Not on file    Attends religious service: Not on file   Active member of club or organization: Not on file    Attends meetings of clubs or organizations: Not on file    Relationship status: Not on file  . Intimate partner violence:    Fear of current or ex partner: Not on file    Emotionally abused: Not on file    Physically abused: Not on file    Forced sexual activity: Not on file  Other Topics Concern  . Not on file  Social History Narrative   Lives in [place].    Trying to find work.           Allergies  Allergen Reactions  . Amoxicillin     Steven-Johnson's Syndrome  . Ceclor [Cefaclor] Other (See Comments)    Steven-Johnson's Syndrome  . Erythromycin     Steven-Johnson's Syndrome  . Penicillins     Steven-Johnson's Syndrome  . Septra [Sulfamethoxazole-Trimethoprim]     Steven-Johnson's Syndrome  . Tylenol [Acetaminophen]     No current facility-administered medications for this encounter.    Current Outpatient Medications  Medication Sig Dispense Refill  . ibuprofen (ADVIL,MOTRIN) 200  MG tablet Take 800 mg by mouth every 6 (six) hours as needed for headache.    . SUBOXONE 8-2 MG FILM Place 1 Film under the tongue 2 (two) times daily.      ROS:   General:  No weight loss, Fever, chills  Cardiac:   Denies history of atrial fibrillation or irregular heartbeat  Skin: No rashes   Physical Examination  Vitals:   04/09/19 1424 04/09/19 1425 04/09/19 1630 04/09/19 1642  BP:  (!) 147/80  132/88  Pulse: 97  88 81  Resp: 16   16  Temp: (!) 97.5 F (36.4 C)     TempSrc: Oral     SpO2: 100%  99% 98%    There is no height or weight on file to calculate BMI.  General:  Alert and oriented, no acute distress Cardiac: Regular Rate and Rhythm Skin: No rash, violaceous purple streak across thenar eminence and dusky thumb with slightly delayed cap refill in distal thumb compared to other digits, no skin changes or splinter hemorrhages in toes or fingers otherwise, no fluctuance in hand Extremity Pulses:  2+  radial/ulnar left and right, brachial, dorsalis pedis, pulses bilaterally Musculoskeletal: No deformity or edema  Neurologic: Upper and lower extremity motor 5/5 and symmetric  DATA:  Duplex shows all large vessels brachial radial ulnar palmar arch right side patent  ASSESSMENT: embolic event right hand all large vessels open, no vascular surgical intervention at this point  PLAN:  Symptomatic care exercise avoid further trauma   Ruta Hinds, MD [facility] Office: [phone] Pager: [phone]

## 2019-04-09 NOTE — ED Triage Notes (Signed)
Pt sent from UC for evaluation of right hand discoloration and pain, states the discoloration started around his wrist and has now spread to his thumb and up his arm along with pain. Denies any injury.

## 2019-04-09 NOTE — ED Provider Notes (Signed)
I saw and evaluated the patient, reviewed the resident's note and I agree with the findings and plan with the following exceptions.   Patient injected Suboxone into his right lateral forearm a couple weeks ago and had discoloration and pain initially but also had neck cellulitis from shooting up in his neck so that started antibiotics for the neck and arm improved however last few days he had progressively worsening pain and discoloration of his thumb and arm.  No fevers or systemic symptoms.  No rash elsewhere.  No Difficulty moving it. See pictures for documentation of his rash and discolored thumb.  He seemed to have good strength with flexion and extension of his thumb.  He has significantly decreased capillary refill.  1 putting the pulse oximeter on the thumb it does not give a reading whereas it does very easily on all of his other fingers of that same hand. Concern for vascular injury or vasculitis of some sort to the radial artery or the branch to go to the thumb.  Will discuss with vascular for further recommendations.           EKG Interpretation  Date/Time:    Ventricular Rate:    PR Interval:    QRS Duration:   QT Interval:    QTC Calculation:   R Axis:     Text Interpretation:           Merrily Pew, MD 04/11/19 0124

## 2019-04-09 NOTE — ED Provider Notes (Signed)
[facility] EMERGENCY DEPARTMENT Provider Note   CSN: [id number] Arrival date & time: 04/09/19  1419    History   Chief Complaint Chief Complaint  Patient presents with  . Hand Pain    HPI [name] is a 28 y.o. male. With a PMHC IVDU, HCV, who presents with right hand pain, swelling, and discoloration after injecting suboxone over his radial artery area. States the discoloration started about 2 week ago and was followed by pain and numbness in his thumb and less so in his index and middle finger. Denies fevers. Initially 2 weeks ago was started on abx for a neck cellulitis and his hand improved but has  Now worsened.     HPI  Past Medical History:  Diagnosis Date  . Hepatitis C   . History of Stevens-Johnson toxic epidermal necrolysis overlap syndrome    with PCN, not with keflex  . 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 Neck abscess 02/07/2019  . Hepatitis C 10/17/2014  . Acute foreign body of left upper arm 10/17/2014  . Tobacco abuse 10/17/2014  . Cellulitis 10/16/2014  . Cellulitis of forearm, left 10/16/2014  . IV drug abuse ([place]) 10/16/2014    Past Surgical History:  Procedure Laterality Date  . TYMPANOSTOMY TUBE PLACEMENT     68-54 years old        Home Medications    Prior to Admission medications   Medication Sig Start Date End Date Taking? Authorizing Provider  ibuprofen (ADVIL,MOTRIN) 200 MG tablet Take 800 mg by mouth every 6 (six) hours as needed for headache.    [provider]  SUBOXONE 8-2 MG FILM Place 1 Film under the tongue 2 (two) times daily. 01/30/19   [provider]    Family History Family History  Problem Relation Age of Onset  . Diabetes Other        grandparents  . Stroke Other   . Pancreatic cancer Other   . Diabetes Mellitus II Other     Social History Social History   Tobacco Use  . Smoking status: Current Every Day  Smoker    Packs/day: 0.00    Years: 5.00    Pack years: 0.00  . Smokeless tobacco: Never Used  Substance Use Topics  . Alcohol use: No  . Drug use: Yes    Types: Marijuana     Allergies   Amoxicillin; Ceclor [cefaclor]; Erythromycin; Penicillins; Septra [sulfamethoxazole-trimethoprim]; and Tylenol [acetaminophen]   Review of Systems Review of Systems  Constitutional: Negative for activity change, appetite change and fever.  HENT: Negative for congestion.   Respiratory: Negative for shortness of breath.   Cardiovascular: Negative for chest pain.  Gastrointestinal: Negative for abdominal pain, diarrhea, nausea and vomiting.  Genitourinary: Negative for dysuria.  Skin: Positive for rash and wound (injection marks).  Neurological: Negative for headaches.     Physical Exam Updated Vital Signs BP 128/86 (BP Location: Left Arm)   Pulse 78   Temp (!) 97.5 F (36.4 C) (Oral)   Resp 16   SpO2 98%   Physical Exam Vitals signs and nursing note reviewed.  Constitutional:      General: He is not in acute distress.    Appearance: Normal appearance. He is well-developed. He is not ill-appearing.  HENT:     Head: Normocephalic and atraumatic.  Eyes:     Conjunctiva/sclera: Conjunctivae normal.  Neck:  Musculoskeletal: Neck supple.  Cardiovascular:     Rate and Rhythm: Normal rate and regular rhythm.  Pulmonary:     Effort: Pulmonary effort is normal. No respiratory distress.     Breath sounds: Normal breath sounds.  Abdominal:     Palpations: Abdomen is soft.     Tenderness: There is no abdominal tenderness.  Skin:    General: Skin is warm and dry.     Capillary Refill: Decreased Cap refill in his right thumb, normal in other digits.    Comments: Right thumb tip is blue. Unable to obtain doppler in thumb.  Neurological:     Mental Status: He is alert.          ED Treatments / Results  Labs (all labs ordered are listed, but only abnormal results are  displayed) Labs Reviewed - No data to display  EKG None  Radiology Dg Wrist Complete Right  Result Date: 04/09/2019 CLINICAL DATA:  28 year old male with discoloration EXAM: RIGHT WRIST - COMPLETE 3+ VIEW COMPARISON:  None. FINDINGS: There is no evidence of fracture or dislocation. There is no evidence of arthropathy or other focal bone abnormality. Soft tissues are unremarkable. IMPRESSION: Negative for acute bony abnormality Electronically Signed   By: Corrie Mckusick D.O.   On: 04/09/2019 15:12   Dg Hand Complete Right  Result Date: 04/09/2019 CLINICAL DATA:  28 year old male with hand discoloration EXAM: RIGHT HAND - COMPLETE 3+ VIEW COMPARISON:  None. FINDINGS: There is no evidence of fracture or dislocation. There is no evidence of arthropathy or other focal bone abnormality. Soft tissues are unremarkable. IMPRESSION: Negative for acute bony abnormality Electronically Signed   By: Corrie Mckusick D.O.   On: 04/09/2019 15:11   Vas [place] Upper Extremity Arterial Duplex  Result Date: 04/09/2019 UPPER EXTREMITY ARTERIAL DUPLEX STUDY Indications: IV user, possible arterial injection to radial artery.              Discoloration at hand and wrist. Arm pain.  Performing Technologist: June Leap RDMS, RVT  Examination Guidelines: A complete evaluation includes B-mode imaging, spectral Doppler, color Doppler, and power Doppler as needed of all accessible portions of each vessel. Bilateral testing is considered an integral part of a complete examination. Limited examinations for reoccurring indications may be performed as noted.  Right Doppler Findings: +-----------+----------+---------+------+--------+ Site       PSV (cm/s)Waveform PlaqueComments +-----------+----------+---------+------+--------+ Brachial   123       triphasic               +-----------+----------+---------+------+--------+ Radial     125       triphasic               +-----------+----------+---------+------+--------+ Ulnar       81        biphasic                +-----------+----------+---------+------+--------+ Palmar Arch40        biphasic                +-----------+----------+---------+------+--------+  Summary:  Right: No arterial obstruction visualized in the right upper        extremity. *See table(s) above for measurements and observations.    Preliminary     Procedures Procedures (including critical care time)  Medications Ordered in ED Medications - No data to display   Initial Impression / Assessment and Plan / ED Course  I have reviewed the triage vital signs and the nursing notes.  Pertinent labs &  imaging results that were available during my care of the patient were reviewed by me and considered in my medical decision making (see chart for details).        BRAD LIEURANCE is a 28 y.o. male with a PMHx of IVDU, HCV who presents to the ED with right hand discoloration and pain after injecting suboxone in his right distal forearm around the region of his radial artery.     On initial exam patient is  well appearing, not in acute distress. VSS, afebrile. physical exam as above (see pictures of hand in the chart).   Concern for inadvertent injection into the Radial artery with associated ischemia of the right distal thumb.  ED interpretation of Imaging: XRs obtained in first look. No fracture or dislocation. UE duplex negative for occlusion.    Consults: Vascular Surgery    Per Vascular patient with likely distal thumb emboli.  No emergent intervention or admission necessary.  Vascular instructed patient to exercise hand.  Do not feel this is infectious. Therefore will not prescribe abx.  Discharge instructions: I explained the diagnosis and have given explicit precautions to return to the ER including change in discoloration or worsening to his hand or any other new or worsening symptoms. Patient is in understanding of plan. All questions answered at this time.  Discharge  instructions concerning home care and prescriptions have been given.  The patient is STABLE and is discharged to home in good condition.     Final Clinical Impressions(s) / ED Diagnoses   Final diagnoses:  IV drug abuse [facility])  Right hand pain    ED Discharge Orders    None       Doneta Public, MD 04/09/19 1930    Merrily Pew, MD 04/11/19 [id number]

## 2019-04-09 NOTE — Progress Notes (Signed)
RUE arterial duplex       has been completed. Preliminary results can be found under CV proc through chart review. June Leap, BS, RDMS, RVT

## 2019-04-10 DIAGNOSIS — I998 Other disorder of circulatory system: Secondary | ICD-10-CM

## 2019-07-22 ENCOUNTER — Other Ambulatory Visit: Payer: Self-pay

## 2019-07-22 ENCOUNTER — Emergency Department (HOSPITAL_COMMUNITY): Payer: Medicaid Other

## 2019-07-22 ENCOUNTER — Encounter (HOSPITAL_COMMUNITY): Payer: Self-pay

## 2019-07-22 ENCOUNTER — Inpatient Hospital Stay (HOSPITAL_COMMUNITY)
Admission: EM | Admit: 2019-07-22 | Discharge: 2019-07-25 | DRG: 982 | Disposition: A | Discharge status: Home / Self Care | Payer: Medicaid Other | Attending: Family Medicine | Admitting: Family Medicine

## 2019-07-22 DIAGNOSIS — B9561 Methicillin susceptible Staphylococcus aureus infection as the cause of diseases classified elsewhere: Secondary | ICD-10-CM | POA: Diagnosis not present

## 2019-07-22 DIAGNOSIS — F1990 Other psychoactive substance use, unspecified, uncomplicated: Secondary | ICD-10-CM

## 2019-07-22 DIAGNOSIS — M609 Myositis, unspecified: Secondary | ICD-10-CM | POA: Diagnosis present

## 2019-07-22 DIAGNOSIS — Z8614 Personal history of Methicillin resistant Staphylococcus aureus infection: Secondary | ICD-10-CM | POA: Diagnosis not present

## 2019-07-22 DIAGNOSIS — B1920 Unspecified viral hepatitis C without hepatic coma: Secondary | ICD-10-CM | POA: Diagnosis present

## 2019-07-22 DIAGNOSIS — B182 Chronic viral hepatitis C: Secondary | ICD-10-CM | POA: Diagnosis present

## 2019-07-22 DIAGNOSIS — Z881 Allergy status to other antibiotic agents status: Secondary | ICD-10-CM

## 2019-07-22 DIAGNOSIS — Z833 Family history of diabetes mellitus: Secondary | ICD-10-CM

## 2019-07-22 DIAGNOSIS — D649 Anemia, unspecified: Secondary | ICD-10-CM

## 2019-07-22 DIAGNOSIS — Z79899 Other long term (current) drug therapy: Secondary | ICD-10-CM | POA: Diagnosis not present

## 2019-07-22 DIAGNOSIS — Z886 Allergy status to analgesic agent status: Secondary | ICD-10-CM | POA: Diagnosis not present

## 2019-07-22 DIAGNOSIS — Z720 Tobacco use: Secondary | ICD-10-CM | POA: Diagnosis present

## 2019-07-22 DIAGNOSIS — E876 Hypokalemia: Secondary | ICD-10-CM | POA: Diagnosis present

## 2019-07-22 DIAGNOSIS — Z823 Family history of stroke: Secondary | ICD-10-CM | POA: Diagnosis not present

## 2019-07-22 DIAGNOSIS — Z882 Allergy status to sulfonamides status: Secondary | ICD-10-CM | POA: Diagnosis not present

## 2019-07-22 DIAGNOSIS — Z880 Allergy status to penicillin: Secondary | ICD-10-CM

## 2019-07-22 DIAGNOSIS — L0211 Cutaneous abscess of neck: Secondary | ICD-10-CM | POA: Diagnosis present

## 2019-07-22 DIAGNOSIS — R569 Unspecified convulsions: Secondary | ICD-10-CM | POA: Diagnosis present

## 2019-07-22 DIAGNOSIS — Z800 Family history of malignant neoplasm of digestive organs: Secondary | ICD-10-CM | POA: Diagnosis not present

## 2019-07-22 DIAGNOSIS — F1910 Other psychoactive substance abuse, uncomplicated: Secondary | ICD-10-CM | POA: Diagnosis present

## 2019-07-22 DIAGNOSIS — M60812 Other myositis, left shoulder: Secondary | ICD-10-CM | POA: Diagnosis not present

## 2019-07-22 DIAGNOSIS — L0291 Cutaneous abscess, unspecified: Secondary | ICD-10-CM | POA: Diagnosis not present

## 2019-07-22 DIAGNOSIS — D75839 Thrombocytosis, unspecified: Secondary | ICD-10-CM

## 2019-07-22 DIAGNOSIS — Z20828 Contact with and (suspected) exposure to other viral communicable diseases: Secondary | ICD-10-CM | POA: Diagnosis present

## 2019-07-22 DIAGNOSIS — R7881 Bacteremia: Secondary | ICD-10-CM | POA: Diagnosis not present

## 2019-07-22 DIAGNOSIS — R7989 Other specified abnormal findings of blood chemistry: Secondary | ICD-10-CM | POA: Diagnosis present

## 2019-07-22 DIAGNOSIS — L03221 Cellulitis of neck: Secondary | ICD-10-CM | POA: Diagnosis present

## 2019-07-22 DIAGNOSIS — F1190 Opioid use, unspecified, uncomplicated: Secondary | ICD-10-CM | POA: Diagnosis not present

## 2019-07-22 DIAGNOSIS — I82C12 Acute embolism and thrombosis of left internal jugular vein: Secondary | ICD-10-CM | POA: Diagnosis present

## 2019-07-22 DIAGNOSIS — B9562 Methicillin resistant Staphylococcus aureus infection as the cause of diseases classified elsewhere: Secondary | ICD-10-CM | POA: Diagnosis present

## 2019-07-22 DIAGNOSIS — F17210 Nicotine dependence, cigarettes, uncomplicated: Secondary | ICD-10-CM | POA: Diagnosis present

## 2019-07-22 DIAGNOSIS — Z1639 Resistance to other specified antimicrobial drug: Secondary | ICD-10-CM | POA: Diagnosis present

## 2019-07-22 DIAGNOSIS — Z23 Encounter for immunization: Secondary | ICD-10-CM | POA: Diagnosis not present

## 2019-07-22 DIAGNOSIS — D473 Essential (hemorrhagic) thrombocythemia: Secondary | ICD-10-CM

## 2019-07-22 DIAGNOSIS — L0390 Cellulitis, unspecified: Secondary | ICD-10-CM | POA: Diagnosis present

## 2019-07-22 DIAGNOSIS — F17200 Nicotine dependence, unspecified, uncomplicated: Secondary | ICD-10-CM | POA: Diagnosis not present

## 2019-07-22 DIAGNOSIS — R6510 Systemic inflammatory response syndrome (SIRS) of non-infectious origin without acute organ dysfunction: Secondary | ICD-10-CM | POA: Diagnosis present

## 2019-07-22 DIAGNOSIS — Z888 Allergy status to other drugs, medicaments and biological substances status: Secondary | ICD-10-CM | POA: Diagnosis not present

## 2019-07-22 LAB — COMPREHENSIVE METABOLIC PANEL
ALT: 12 U/L (ref 0–44)
AST: 10 U/L — ABNORMAL LOW (ref 15–41)
Albumin: 3.6 g/dL (ref 3.5–5.0)
Alkaline Phosphatase: 74 U/L (ref 38–126)
Anion gap: 12 (ref 5–15)
BUN: 7 mg/dL (ref 6–20)
CO2: 28 mmol/L (ref 22–32)
Calcium: 8.9 mg/dL (ref 8.9–10.3)
Chloride: 97 mmol/L — ABNORMAL LOW (ref 98–111)
Creatinine, Ser: 0.62 mg/dL (ref 0.61–1.24)
GFR calc Af Amer: >60 mL/min (ref >60)
GFR calc non Af Amer: >60 mL/min (ref >60)
Glucose, Bld: 112 mg/dL — ABNORMAL HIGH (ref 70–99)
Potassium: 3.8 mmol/L (ref 3.5–5.1)
Sodium: 137 mmol/L (ref 135–145)
Total Bilirubin: 0.5 mg/dL (ref 0.3–1.2)
Total Protein: 8.1 g/dL (ref 6.5–8.1)

## 2019-07-22 LAB — CBC WITH DIFFERENTIAL/PLATELET
Abs Immature Granulocytes: 0.06 10*3/uL (ref 0.00–0.07)
Basophils Absolute: 0 10*3/uL (ref 0.0–0.1)
Basophils Relative: 0 %
Eosinophils Absolute: 0.2 10*3/uL (ref 0.0–0.5)
Eosinophils Relative: 2 %
HCT: 35.9 % — ABNORMAL LOW (ref 39.0–52.0)
Hemoglobin: 11.7 g/dL — ABNORMAL LOW (ref 13.0–17.0)
Immature Granulocytes: 1 %
Lymphocytes Relative: 14 %
Lymphs Abs: 1.5 10*3/uL (ref 0.7–4.0)
MCH: 27.5 pg (ref 26.0–34.0)
MCHC: 32.6 g/dL (ref 30.0–36.0)
MCV: 84.3 fL (ref 80.0–100.0)
Monocytes Absolute: 0.9 10*3/uL (ref 0.1–1.0)
Monocytes Relative: 8 %
Neutro Abs: 7.8 10*3/uL — ABNORMAL HIGH (ref 1.7–7.7)
Neutrophils Relative %: 75 %
Platelets: 439 10*3/uL — ABNORMAL HIGH (ref 150–400)
RBC: 4.26 MIL/uL (ref 4.22–5.81)
RDW: 11.9 % (ref 11.5–15.5)
WBC: 10.4 10*3/uL (ref 4.0–10.5)
nRBC: 0 % (ref 0.0–0.2)

## 2019-07-22 LAB — PROTIME-INR
INR: 1 (ref 0.8–1.2)
Prothrombin Time: 12.9 seconds (ref 11.4–15.2)

## 2019-07-22 LAB — ETHANOL: Alcohol, Ethyl (B): <10 mg/dL (ref <10)

## 2019-07-22 LAB — ACETAMINOPHEN LEVEL: Acetaminophen (Tylenol), Serum: <10 ug/mL — ABNORMAL LOW (ref 10–30)

## 2019-07-22 LAB — RAPID URINE DRUG SCREEN, HOSP PERFORMED
Amphetamines: NOT DETECTED
Barbiturates: NOT DETECTED
Benzodiazepines: POSITIVE — AB
Cocaine: NOT DETECTED
Opiates: NOT DETECTED
Tetrahydrocannabinol: NOT DETECTED

## 2019-07-22 LAB — SALICYLATE LEVEL: Salicylate Lvl: <7 mg/dL (ref 2.8–30.0)

## 2019-07-22 MED ORDER — HEPARIN BOLUS VIA INFUSION
2000.0000 [IU] | Freq: Once | INTRAVENOUS | Status: AC
  Administered 2019-07-22: 2000 [IU] via INTRAVENOUS
  Filled 2019-07-22: qty 2000

## 2019-07-22 MED ORDER — CLINDAMYCIN PHOSPHATE 900 MG/50ML IV SOLN
900.0000 mg | Freq: Once | INTRAVENOUS | Status: AC
  Administered 2019-07-22: 900 mg via INTRAVENOUS
  Filled 2019-07-22: qty 50

## 2019-07-22 MED ORDER — CLINDAMYCIN PHOSPHATE 900 MG/50ML IV SOLN
900.0000 mg | Freq: Three times a day (TID) | INTRAVENOUS | Status: DC
  Administered 2019-07-23 (×2): 900 mg via INTRAVENOUS
  Filled 2019-07-22 (×3): qty 50

## 2019-07-22 MED ORDER — BUPRENORPHINE HCL 8 MG SL SUBL
8.0000 mg | SUBLINGUAL_TABLET | Freq: Every day | SUBLINGUAL | Status: DC
  Administered 2019-07-23 – 2019-07-25 (×3): 8 mg via SUBLINGUAL
  Filled 2019-07-22 (×3): qty 1

## 2019-07-22 MED ORDER — PNEUMOCOCCAL VAC POLYVALENT 25 MCG/0.5ML IJ INJ
0.5000 mL | INJECTION | INTRAMUSCULAR | Status: AC
  Administered 2019-07-23: 0.5 mL via INTRAMUSCULAR
  Filled 2019-07-22: qty 0.5

## 2019-07-22 MED ORDER — HEPARIN (PORCINE) 25000 UT/250ML-% IV SOLN
1400.0000 [IU]/h | INTRAVENOUS | Status: DC
  Administered 2019-07-22: 21:00:00 1400 [IU]/h via INTRAVENOUS
  Filled 2019-07-22 (×2): qty 250

## 2019-07-22 MED ORDER — NICOTINE 14 MG/24HR TD PT24
14.0000 mg | MEDICATED_PATCH | Freq: Every day | TRANSDERMAL | Status: DC
  Administered 2019-07-22 – 2019-07-25 (×4): 14 mg via TRANSDERMAL
  Filled 2019-07-22 (×4): qty 1

## 2019-07-22 MED ORDER — FENTANYL CITRATE (PF) 100 MCG/2ML IJ SOLN
50.0000 ug | INTRAMUSCULAR | Status: AC | PRN
  Administered 2019-07-22 – 2019-07-23 (×2): 50 ug via INTRAVENOUS
  Filled 2019-07-22 (×2): qty 2

## 2019-07-22 MED ORDER — OXYCODONE HCL 5 MG PO TABS
5.0000 mg | ORAL_TABLET | ORAL | Status: DC | PRN
  Administered 2019-07-22 – 2019-07-24 (×8): 5 mg via ORAL
  Filled 2019-07-22 (×8): qty 1

## 2019-07-22 MED ORDER — IOHEXOL 300 MG/ML  SOLN
75.0000 mL | Freq: Once | INTRAMUSCULAR | Status: AC | PRN
  Administered 2019-07-22: 75 mL via INTRAVENOUS

## 2019-07-22 NOTE — ED Provider Notes (Addendum)
[facility] DEPT Provider Note   CSN: [id number] Arrival date & time: 07/22/19  1528     History   Chief Complaint Chief Complaint  Patient presents with  . Neck Pain    HPI [name] is a 28 y.o. male.      Neck Pain   Pt was seen at 1650. Per pt, gradual onset and worsening of persistent left sided neck "swelling" for the past 1 week, worse over the past 3 days. Pt has hx of same due to IVDU, but pt denies he has been injecting drugs in his neck recently. Pt states he is currently on suboxone. Denies CP/SOB, no cough, no sore throat, no fevers, no rash, no dental pain, no injury, no dysphagia.    Past Medical History:  Diagnosis Date  . Hepatitis C   . History of Stevens-Johnson toxic epidermal necrolysis overlap syndrome    with PCN, not with keflex  . Opiate addiction ([place])    on methadone    Patient Active Problem List   Diagnosis Date Noted  . Neck abscess 02/07/2019  . Hepatitis C 10/17/2014  . Acute foreign body of left upper arm 10/17/2014  . Tobacco abuse 10/17/2014  . Cellulitis 10/16/2014  . Cellulitis of forearm, left 10/16/2014  . IV drug abuse ([place]) 10/16/2014    Past Surgical History:  Procedure Laterality Date  . TYMPANOSTOMY TUBE PLACEMENT     40-15 years old        Home Medications    Prior to Admission medications   Medication Sig Start Date End Date Taking? Authorizing Provider  Aspirin-Salicylamide-Caffeine (BC HEADACHE POWDER PO) Take 1 packet by mouth every 6 (six) hours as needed (headache).   Yes [provider]  ibuprofen (ADVIL,MOTRIN) 200 MG tablet Take 800 mg by mouth every 6 (six) hours as needed for headache.   Yes [provider]  SUBOXONE 8-2 MG FILM Place 1 Film under the tongue 2 (two) times daily. 01/30/19  Yes [provider]    Family History Family History  Problem Relation Age of Onset  . Diabetes Other        grandparents  . Stroke Other   .  Pancreatic cancer Other   . Diabetes Mellitus II Other     Social History Social History   Tobacco Use  . Smoking status: Current Every Day Smoker    Packs/day: 0.00    Years: 5.00    Pack years: 0.00  . Smokeless tobacco: Never Used  Substance Use Topics  . Alcohol use: No  . Drug use: Yes    Types: Marijuana     Allergies   Amoxicillin, Ceclor [cefaclor], Erythromycin, Penicillins, Septra [sulfamethoxazole-trimethoprim], and Tylenol [acetaminophen]   Review of Systems Review of Systems  Musculoskeletal: Positive for neck pain.  ROS: Statement: All systems negative except as marked or noted in the HPI; Constitutional: Negative for fever and chills. ; ; Eyes: Negative for eye pain, redness and discharge. ; ; ENMT: Negative for ear pain, hoarseness, nasal congestion, sinus pressure and sore throat. ; ; Cardiovascular: Negative for chest pain, palpitations, diaphoresis, dyspnea and peripheral edema. ; ; Respiratory: Negative for cough, wheezing and stridor. ; ; Gastrointestinal: Negative for nausea, vomiting, diarrhea, abdominal pain, blood in stool, hematemesis, jaundice and rectal bleeding. . ; ; Genitourinary: Negative for dysuria, flank pain and hematuria. ; ; Musculoskeletal: Negative for back pain and neck pain. Negative for trauma.; ; Skin: +left neck swelling. Negative for pruritus, rash,  abrasions, blisters, bruising and skin lesion.; ; Neuro: Negative for headache, lightheadedness and neck stiffness. Negative for weakness, altered level of consciousness, altered mental status, extremity weakness, paresthesias, involuntary movement, seizure and syncope.        Physical Exam Updated Vital Signs BP 125/77 (BP Location: Left Arm)   Pulse (!) 112   Temp 98.6 F (37 C) (Oral)   Resp 18   Ht 5\' 5"  (1.651 m)   Wt 72.6 kg   SpO2 96%   BMI 26.63 kg/m   Physical Exam 1655: Physical examination:  Nursing notes reviewed; Vital signs and O2 SAT reviewed;  Constitutional:  Well developed, Well nourished, Well hydrated, In no acute distress; Head:  Normocephalic, atraumatic; Eyes: EOMI, PERRL, No scleral icterus; ENMT: Mouth and pharynx normal, Mucous membranes moist. Mouth and pharynx without lesions. No tonsillar exudates. No intra-oral edema. No sublingual edema. No hoarse voice, no drooling, no stridor.  No trismus. No gingival edema.; Neck: Supple, Full range of motion. +edema to left anterior-lateral neck, extending from inferior mandible area to supraclavicular area. +small healed wound noted left medial clavicle area. +new small superficial wound without erythema, drainage, fluctuance noted at right medial clavicle area.; Cardiovascular: Regular rate and rhythm, No gallop; Respiratory: Breath sounds clear & equal bilaterally, No wheezes.  Speaking full sentences with ease, Normal respiratory effort/excursion; Chest: Nontender, Movement normal; Abdomen: Soft, Nontender, Nondistended, Normal bowel sounds; Genitourinary: No CVA tenderness; Extremities: Peripheral pulses normal, No tenderness, No edema, No calf edema or asymmetry.; Neuro: AA&Ox3, Major CN grossly intact.  Speech clear. No gross focal motor or sensory deficits in extremities.; Skin: Color normal, Warm, Dry.   ED Treatments / Results  Labs (all labs ordered are listed, but only abnormal results are displayed)   EKG None  Radiology   Procedures Procedures (including critical care time)  Medications Ordered in ED Medications - No data to display   Initial Impression / Assessment and Plan / ED Course  I have reviewed the triage vital signs and the nursing notes.  Pertinent labs & imaging results that were available during my care of the patient were reviewed by me and considered in my medical decision making (see chart for details).     MDM Reviewed: previous chart, nursing note and vitals Reviewed previous: labs Interpretation: labs and CT scan Total time providing critical care: 30-74  minutes. This excludes time spent performing separately reportable procedures and services. Consults: Vascular MD, ENT MD, Admitting MD.   CRITICAL CARE Performed by: Francine Graven Total critical care time: 35 minutes Critical care time was exclusive of separately billable procedures and treating other patients. Critical care was necessary to treat or prevent imminent or life-threatening deterioration. Critical care was time spent personally by me on the following activities: development of treatment plan with patient and/or surrogate as well as nursing, discussions with consultants, evaluation of patient's response to treatment, examination of patient, obtaining history from patient or surrogate, ordering and performing treatments and interventions, ordering and review of laboratory studies, ordering and review of radiographic studies, pulse oximetry and re-evaluation of patient's condition.   Results for orders placed or performed during the hospital encounter of 07/22/19  Acetaminophen level  Result Value Ref Range   Acetaminophen (Tylenol), Serum <10 (L) 10 - 30 ug/mL  Comprehensive metabolic panel  Result Value Ref Range   Sodium 137 135 - 145 mmol/L   Potassium 3.8 3.5 - 5.1 mmol/L   Chloride 97 (L) 98 - 111 mmol/L   CO2 28  22 - 32 mmol/L   Glucose, Bld 112 (H) 70 - 99 mg/dL   BUN 7 6 - 20 mg/dL   Creatinine, Ser 0.62 0.61 - 1.24 mg/dL   Calcium 8.9 8.9 - 10.3 mg/dL   Total Protein 8.1 6.5 - 8.1 g/dL   Albumin 3.6 3.5 - 5.0 g/dL   AST 10 (L) 15 - 41 U/L   ALT 12 0 - 44 U/L   Alkaline Phosphatase 74 38 - 126 U/L   Total Bilirubin 0.5 0.3 - 1.2 mg/dL   GFR calc non Af Amer >60 >60 mL/min   GFR calc Af Amer >60 >60 mL/min   Anion gap 12 5 - 15  Ethanol  Result Value Ref Range   Alcohol, Ethyl (B) <10 <10 mg/dL  Urine rapid drug screen (hosp performed)  Result Value Ref Range   Opiates NONE DETECTED NONE DETECTED   Cocaine NONE DETECTED NONE DETECTED   Benzodiazepines  POSITIVE (A) NONE DETECTED   Amphetamines NONE DETECTED NONE DETECTED   Tetrahydrocannabinol NONE DETECTED NONE DETECTED   Barbiturates NONE DETECTED NONE DETECTED  Salicylate level  Result Value Ref Range   Salicylate Lvl [id number] 2.8 - 30.0 mg/dL  CBC with Differential  Result Value Ref Range   WBC 10.4 4.0 - 10.5 K/uL   RBC 4.26 4.22 - 5.81 MIL/uL   Hemoglobin 11.7 (L) 13.0 - 17.0 g/dL   HCT 35.9 (L) 39.0 - 52.0 %   MCV 84.3 80.0 - 100.0 fL   MCH 27.5 26.0 - 34.0 pg   MCHC 32.6 30.0 - 36.0 g/dL   RDW 11.9 11.5 - 15.5 %   Platelets 439 (H) 150 - 400 K/uL   nRBC 0.0 0.0 - 0.2 %   Neutrophils Relative % 75 %   Neutro Abs 7.8 (H) 1.7 - 7.7 K/uL   Lymphocytes Relative 14 %   Lymphs Abs 1.5 0.7 - 4.0 K/uL   Monocytes Relative 8 %   Monocytes Absolute 0.9 0.1 - 1.0 K/uL   Eosinophils Relative 2 %   Eosinophils Absolute 0.2 0.0 - 0.5 K/uL   Basophils Relative 0 %   Basophils Absolute 0.0 0.0 - 0.1 K/uL   Immature Granulocytes 1 %   Abs Immature Granulocytes 0.06 0.00 - 0.07 K/uL  Protime-INR  Result Value Ref Range   Prothrombin Time 12.9 11.4 - 15.2 seconds   INR 1.0 0.8 - 1.2   Dg Chest 2 View Result Date: 07/22/2019 CLINICAL DATA:  Neck swelling EXAM: CHEST - 2 VIEW COMPARISON:  11/25/2018 FINDINGS: Bibasilar atelectasis. Heart is normal size. No effusions. No acute bony abnormality. IMPRESSION: Bibasilar atelectasis. Electronically Signed   By: Rolm Baptise M.D.   On: 07/22/2019 17:29   Ct Soft Tissue Neck W Contrast Result Date: 07/22/2019 CLINICAL DATA:  Neck swelling for 1 week. EXAM: CT NECK WITH CONTRAST TECHNIQUE: Multidetector CT imaging of the neck was performed using the standard protocol following the bolus administration of intravenous contrast. CONTRAST:  71mL OMNIPAQUE IOHEXOL 300 MG/ML  SOLN COMPARISON:  CT neck 02/06/2019 FINDINGS: PHARYNX AND LARYNX: --Nasopharynx: Fossae of Rosenmuller are clear. Normal adenoid tonsils for age. --Oral cavity and oropharynx: The  palatine and lingual tonsils are normal. The visible oral cavity and floor of mouth are normal. --Hypopharynx: Normal vallecula and pyriform sinuses. --Larynx: Normal epiglottis and pre-epiglottic space. Normal aryepiglottic and vocal folds. --Retropharyngeal space: No abscess, effusion or lymphadenopathy. SALIVARY GLANDS: --Parotid: No mass lesion or inflammation. No sialolithiasis or ductal dilatation. --Submandibular: Left submandibular  gland is enlarged. --Sublingual: Normal. No ranula or other visible lesion of the base of tongue and floor of mouth. THYROID: Normal. LYMPH NODES: Left level 1B nodes measure up to 10 mm. VASCULAR: There is thrombosis of the left internal jugular vein along nearly its entire length. At the level of the thyroid cartilage, there is an adjacent abscess that is continuous with the anterior aspect of the internal jugular vein (series 3, image 71) that measures 18 x 20 mm. There is marked swelling of the overlying sternocleidomastoid muscle with inflammatory induration the left neck subcutaneous tissues. LIMITED INTRACRANIAL: Normal. VISUALIZED ORBITS: Normal. MASTOIDS AND VISUALIZED PARANASAL SINUSES: No fluid levels or advanced mucosal thickening. No mastoid effusion. SKELETON: No bony spinal canal stenosis. No lytic or blastic lesions. UPPER CHEST: Clear. OTHER: None. IMPRESSION: 1. 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 2. Left sternocleidomastoid myositis and left neck cellulitis with reactive left cervical lymphadenopathy. Critical Value/emergent results were called by telephone at the time of interpretation on 07/22/2019 at 7:53 pm to Dr. Francine Graven , who verbally acknowledged these results. Electronically Signed   By: Ulyses Jarred M.D.   On: 07/22/2019 19:56    [name] was evaluated in Emergency Department on 07/22/2019 for the symptoms described in the history of present illness. He was evaluated  in the context of the global COVID-19 pandemic, which necessitated consideration that the patient might be at risk for infection with the SARS-CoV-2 virus that causes COVID-19. Institutional protocols and algorithms that pertain to the evaluation of patients at risk for COVID-19 are in a state of rapid change based on information released by regulatory bodies including the CDC and federal and state organizations. These policies and algorithms were followed during the patient's care in the ED.   2030:  CT with new DVT and abscess/cellulitis. IV clindamycin ordered.  T/C returned from [facility] Dr. Constance Holster, case discussed, including:  HPI, pertinent PM/SHx, VS/PE, dx testing, ED course and treatment:  He will view the CT images, agreeable to consult.   2045:  T/C returned from [facility] Dr. Carlis Abbott, case discussed, including:  HPI, pertinent PM/SHx, VS/PE, dx testing, ED course and treatment:  Since new left IJ DVT was a provoked DVT, start IV heparin for now and pt will need anticoagulation for the next 3 (to 6) months.   2055:  T/C returned from [facility] Dr. Renaee Munda, case discussed, including:  HPI, pertinent PM/SHx, VS/PE, dx testing, ED course and treatment, as well as d/w ENT MD and Vascular Surgery MD:  Agreeable to admit.     Final Clinical Impressions(s) / ED Diagnoses   Final diagnoses:  None    ED Discharge Orders    None       Francine Graven, DO 07/26/19 [place], [place], DO 07/28/19 [id number]

## 2019-07-22 NOTE — ED Notes (Signed)
Patient transported to CT 

## 2019-07-22 NOTE — ED Triage Notes (Signed)
States neck swelling x one week and redness and warmth no drooling or stridor noted unknown what happened to cause it.

## 2019-07-22 NOTE — H&P (Signed)
History and Physical  [name]:[id number] DOB: 04-Feb-1991 DOA: 07/22/2019   PCP: Patient, No Pcp Per   Patient coming from: Home   Chief Complaint: left neck pain   HPI: This is a 28 y.o. male with medical history significant for IVDU, alcohol and tobacco abuse admitted with left neck cellulitis abscess and associated internal jugular embolism. He has had neck pain as well as fevers and chills for 10 days. Says he has not done any drugs for a long time, definitely did not shoot into his neck at all, and has no idea why he has an infection there. He admits to some scabs on his neck but those are from "messing around" with his kids. Denies any chest pain, nausea, shortness of breath, or difficulty swallowing.    ED Course: In the ER, CT neck revealed abscess, cellulitis associated left internal jugular embolism. He does not appear toxic, ENT recommends admission and will review imaging. Vascular surgery recommends 3-6 months of anticoagulation but heparin for now in case he needs surgery.  Review of Systems: Please see HPI for pertinent positives and negatives. A complete 10 system review of systems are otherwise negative.  Past Medical History:  Diagnosis Date  . Hepatitis C   . History of Stevens-Johnson toxic epidermal necrolysis overlap syndrome    with PCN, not with keflex  . Opiate addiction (HCC)    on methadone   Past Surgical History:  Procedure Laterality Date  . TYMPANOSTOMY TUBE PLACEMENT     80-30 years old    Social History:  reports that he has been smoking. He has been smoking about 0.00 packs per day for the past 5.00 years. He has never used smokeless tobacco. He reports current drug use. Drug: Marijuana. He reports that he does not drink alcohol.   Allergies  Allergen Reactions  . Amoxicillin     Steven-Johnson's Syndrome  . Ceclor [Cefaclor] Other (See Comments)    Steven-Johnson's Syndrome  . Erythromycin     Steven-Johnson's Syndrome  .  Penicillins     Steven-Johnson's Syndrome  . Septra [Sulfamethoxazole-Trimethoprim]     Steven-Johnson's Syndrome  . Tylenol [Acetaminophen]     Family History  Problem Relation Age of Onset  . Diabetes Other        grandparents  . Stroke Other   . Pancreatic cancer Other   . Diabetes Mellitus II Other      Prior to Admission medications   Medication Sig Start Date End Date Taking? Authorizing Provider  Aspirin-Salicylamide-Caffeine (BC HEADACHE POWDER PO) Take 1 packet by mouth every 6 (six) hours as needed (headache).   Yes [provider]  ibuprofen (ADVIL,MOTRIN) 200 MG tablet Take 800 mg by mouth every 6 (six) hours as needed for headache.   Yes [provider]  SUBOXONE 8-2 MG FILM Place 1 Film under the tongue 2 (two) times daily. 01/30/19  Yes [provider]    Physical Exam: BP 120/71   Pulse 88   Temp 98.6 F (37 C) (Oral)   Resp 18   Ht 5\' 5"  (1.651 m)   Wt 72.6 kg   SpO2 100%   BMI 26.63 kg/m   General:  Alert, oriented, calm, in no acute distress  Eyes: EOMI, clear conjuctivae, white sclerea Neck: supple, left neck swelling from clavicle to angle of mandible, tender and warm to touch, not fluctuant and no overlying erythema. No stridor or respiratory distress.  Cardiovascular: RRR, no murmurs or rubs, no peripheral edema  Respiratory: clear to auscultation bilaterally, no wheezes, no crackles  Abdomen: soft, nontender, nondistended, normal bowel tones heard  Skin: dry, no rashes  Musculoskeletal: no joint effusions, normal range of motion  Psychiatric: appropriate affect, normal speech  Neurologic: extraocular muscles intact, clear speech, moving all extremities with intact sensorium            Labs on Admission:  Basic Metabolic Panel: Recent Labs  Lab 07/22/19 1700  NA 137  K 3.8  CL 97*  CO2 28  GLUCOSE 112*  BUN 7  CREATININE 0.62  CALCIUM 8.9   Liver Function Tests: Recent Labs  Lab 07/22/19 1700  AST 10*   ALT 12  ALKPHOS 74  BILITOT 0.5  PROT 8.1  ALBUMIN 3.6   No results for input(s): LIPASE, AMYLASE in the last 168 hours. No results for input(s): AMMONIA in the last 168 hours. CBC: Recent Labs  Lab 07/22/19 1700  WBC 10.4  NEUTROABS 7.8*  HGB 11.7*  HCT 35.9*  MCV 84.3  PLT 439*   Cardiac Enzymes: No results for input(s): CKTOTAL, CKMB, CKMBINDEX, TROPONINI in the last 168 hours.  BNP (last 3 results) No results for input(s): BNP in the last 8760 hours.  ProBNP (last 3 results) No results for input(s): PROBNP in the last 8760 hours.  CBG: No results for input(s): GLUCAP in the last 168 hours.  Radiological Exams on Admission: Dg Chest 2 View  Result Date: 07/22/2019 CLINICAL DATA:  Neck swelling EXAM: CHEST - 2 VIEW COMPARISON:  11/25/2018 FINDINGS: Bibasilar atelectasis. Heart is normal size. No effusions. No acute bony abnormality. IMPRESSION: Bibasilar atelectasis. Electronically Signed   By: Charlett Nose M.D.   On: 07/22/2019 17:29   Ct Soft Tissue Neck W Contrast  Result Date: 07/22/2019 CLINICAL DATA:  Neck swelling for 1 week. EXAM: CT NECK WITH CONTRAST TECHNIQUE: Multidetector CT imaging of the neck was performed using the standard protocol following the bolus administration of intravenous contrast. CONTRAST:  75mL OMNIPAQUE IOHEXOL 300 MG/ML  SOLN COMPARISON:  CT neck 02/06/2019 FINDINGS: PHARYNX AND LARYNX: --Nasopharynx: Fossae of Rosenmuller are clear. Normal adenoid tonsils for age. --Oral cavity and oropharynx: The palatine and lingual tonsils are normal. The visible oral cavity and floor of mouth are normal. --Hypopharynx: Normal vallecula and pyriform sinuses. --Larynx: Normal epiglottis and pre-epiglottic space. Normal aryepiglottic and vocal folds. --Retropharyngeal space: No abscess, effusion or lymphadenopathy. SALIVARY GLANDS: --Parotid: No mass lesion or inflammation. No sialolithiasis or ductal dilatation. --Submandibular: Left submandibular gland is  enlarged. --Sublingual: Normal. No ranula or other visible lesion of the base of tongue and floor of mouth. THYROID: Normal. LYMPH NODES: Left level 1B nodes measure up to 10 mm. VASCULAR: There is thrombosis of the left internal jugular vein along nearly its entire length. At the level of the thyroid cartilage, there is an adjacent abscess that is continuous with the anterior aspect of the internal jugular vein (series 3, image 71) that measures 18 x 20 mm. There is marked swelling of the overlying sternocleidomastoid muscle with inflammatory induration the left neck subcutaneous tissues. LIMITED INTRACRANIAL: Normal. VISUALIZED ORBITS: Normal. MASTOIDS AND VISUALIZED PARANASAL SINUSES: No fluid levels or advanced mucosal thickening. No mastoid effusion. SKELETON: No bony spinal canal stenosis. No lytic or blastic lesions. UPPER CHEST: Clear. OTHER: None. IMPRESSION: 1. 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 2. Left sternocleidomastoid myositis and left neck cellulitis with reactive left cervical lymphadenopathy. Critical Value/emergent results were  called by telephone at the time of interpretation on 07/22/2019 at 7:53 pm to Dr. Samuel Jester , who verbally acknowledged these results. Electronically Signed   By: Deatra Robinson M.D.   On: 07/22/2019 19:56   Assessment/Plan Present on Admission: . Hepatitis C . Tobacco abuse . IV drug abuse (HCC) . Neck abscess . Internal jugular vein thrombosis, left (HCC) . Cellulitis  Principal Problem:   Neck abscess - suspected from IVDU, he is not septic - inpatient admission - empiric clindamycin - ENT consulted and will review imaging - will keep NPO at MN in case of intervention  Internal jugular vein thrombosis, left (HCC) - start IV heparin gtt in case needs surgery - will likely need 3-6 months AC, but compliance will certainly be an issue  Cellulitis - IV clindamycin for now,  treating as above   IV drug abuse (HCC)   Hepatitis C   Tobacco abuse   Thrombocytosis (HCC)   Anemia  DVT prophylaxis: Heparin Gtt   Code Status: FULL    Family Communication: None   Disposition Plan: Home at DC.   Consults called: ENT and Vascular are aware   Admission status: Inpatient   Time spent: 46 minutes  Namine Beahm Vergie Living MD [facility] Pager [phone]  If 7PM-7AM, please contact night-coverage [email or web address] Password [facility]  07/22/2019, 9:14 PM

## 2019-07-22 NOTE — ED Notes (Signed)
ED TO INPATIENT HANDOFF REPORT  Name/Age/Gender [name] 28 y.o. male  Code Status    Code Status Orders  (From admission, onward)         Start     Ordered   07/22/19 2113  Full code  Continuous     07/22/19 2113        Code Status History    Date Active Date Inactive Code Status Order ID Comments User Context   02/07/2019 [id number] 02/09/2019 1747 Full Code [id number]  Nita Sells, MD ED   10/16/2014 2127 10/19/2014 1553 Full Code [id number]  Rise Patience, MD Inpatient   08/24/2013 1104 08/24/2013 1635 Full Code [id number]  Erick Blinks ED   Advance Care Planning Activity      Home/SNF/Other Home  Chief Complaint Swollen Neck   Level of Care/Admitting Diagnosis ED Disposition    ED Disposition Condition [facility] Area: [facility] [id number]  Level of Care: Telemetry [5]  Admit to tele based on following criteria: Other see comments  Comments: potential for decompensation  Covid Evaluation: Asymptomatic Screening Protocol (No Symptoms)  Diagnosis: Neck abscess [id number]  Admitting Physician: Rosemarie Beath [facility] [id number]  Attending Physician: [facility], [facility] [id number]  Estimated length of stay: past midnight tomorrow  Certification:: I certify this patient will need inpatient services for at least 2 midnights  PT Class (Do Not Modify): Inpatient [101]  PT Acc Code (Do Not Modify): Private [1]       Medical History Past Medical History:  Diagnosis Date  . Hepatitis C   . History of Stevens-Johnson toxic epidermal necrolysis overlap syndrome    with PCN, not with keflex  . Opiate addiction ([place])    on methadone    Allergies Allergies  Allergen Reactions  . Amoxicillin     Steven-Johnson's Syndrome  . Ceclor [Cefaclor] Other (See Comments)    Steven-Johnson's Syndrome  . Erythromycin     Steven-Johnson's Syndrome  . Penicillins     Steven-Johnson's Syndrome  . Septra  [Sulfamethoxazole-Trimethoprim]     Steven-Johnson's Syndrome  . Tylenol [Acetaminophen]     IV Location/Drains/Wounds Patient Lines/Drains/Airways Status   Active Line/Drains/Airways    Name:   Placement date:   Placement time:   Site:   Days:   Peripheral IV 07/22/19 Right;Upper Arm   07/22/19    1812    Arm   less than 1          Labs/Imaging Results for orders placed or performed during the hospital encounter of 07/22/19 (from the past 48 hour(s))  Acetaminophen level     Status: Abnormal   Collection Time: 07/22/19  5:00 PM  Result Value Ref Range   Acetaminophen (Tylenol), Serum <10 (L) 10 - 30 ug/mL    Comment: (NOTE) Therapeutic concentrations vary significantly. A range of 10-30 ug/mL  may be an effective concentration for many patients. However, some  are best treated at concentrations outside of this range. Acetaminophen concentrations >150 ug/mL at 4 hours after ingestion  and >50 ug/mL at 12 hours after ingestion are often associated with  toxic reactions. Performed at [facility], [place] [address], [place],  [address]   Comprehensive metabolic panel     Status: Abnormal   Collection Time: 07/22/19  5:00 PM  Result Value Ref Range   Sodium 137 135 - 145 mmol/L   Potassium 3.8 3.5 - 5.1 mmol/L   Chloride 97 (L) 98 - 111  mmol/L   CO2 28 22 - 32 mmol/L   Glucose, Bld 112 (H) 70 - 99 mg/dL   BUN 7 6 - 20 mg/dL   Creatinine, Ser 0.62 0.61 - 1.24 mg/dL   Calcium 8.9 8.9 - 10.3 mg/dL   Total Protein 8.1 6.5 - 8.1 g/dL   Albumin 3.6 3.5 - 5.0 g/dL   AST 10 (L) 15 - 41 U/L   ALT 12 0 - 44 U/L   Alkaline Phosphatase 74 38 - 126 U/L   Total Bilirubin 0.5 0.3 - 1.2 mg/dL   GFR calc non Af Amer >60 >60 mL/min   GFR calc Af Amer >60 >60 mL/min   Anion gap 12 5 - 15    Comment: Performed at [facility], [place] [address] [place], [place] [address]  Ethanol     Status: None   Collection Time: 07/22/19  5:00 PM  Result Value  Ref Range   Alcohol, Ethyl (B) <10 <10 mg/dL    Comment: (NOTE) Lowest detectable limit for serum alcohol is 10 mg/dL. For medical purposes only. Performed at [facility], [place] [address] [place], [place] [address]   Urine rapid drug screen (hosp performed)     Status: Abnormal   Collection Time: 07/22/19  5:00 PM  Result Value Ref Range   Opiates NONE DETECTED NONE DETECTED   Cocaine NONE DETECTED NONE DETECTED   Benzodiazepines POSITIVE (A) NONE DETECTED   Amphetamines NONE DETECTED NONE DETECTED   Tetrahydrocannabinol NONE DETECTED NONE DETECTED   Barbiturates NONE DETECTED NONE DETECTED    Comment: (NOTE) DRUG SCREEN FOR MEDICAL PURPOSES ONLY.  IF CONFIRMATION IS NEEDED FOR ANY PURPOSE, NOTIFY LAB WITHIN 5 DAYS. LOWEST DETECTABLE LIMITS FOR URINE DRUG SCREEN Drug Class                     Cutoff (ng/mL) Amphetamine and metabolites    1000 Barbiturate and metabolites    200 Benzodiazepine                 [id number] Tricyclics and metabolites     300 Opiates and metabolites        300 Cocaine and metabolites        300 THC                            50 Performed at [facility], [place] [address], [place], [place] [id number]   Salicylate level     Status: None   Collection Time: 07/22/19  5:00 PM  Result Value Ref Range   Salicylate Lvl [id number] 2.8 - 30.0 mg/dL    Comment: Performed at [facility], [place] [address] [place], [place] [address]  CBC with Differential     Status: Abnormal   Collection Time: 07/22/19  5:00 PM  Result Value Ref Range   WBC 10.4 4.0 - 10.5 K/uL   RBC 4.26 4.22 - 5.81 MIL/uL   Hemoglobin 11.7 (L) 13.0 - 17.0 g/dL   HCT 35.9 (L) 39.0 - 52.0 %   MCV 84.3 80.0 - 100.0 fL   MCH 27.5 26.0 - 34.0 pg   MCHC 32.6 30.0 - 36.0 g/dL   RDW 11.9 11.5 - 15.5 %   Platelets 439 (H) 150 - 400 K/uL   nRBC 0.0 0.0 - 0.2 %   Neutrophils Relative % 75 %   Neutro Abs 7.8 (H) 1.7 - 7.7  K/uL   Lymphocytes Relative  14 %   Lymphs Abs 1.5 0.7 - 4.0 K/uL   Monocytes Relative 8 %   Monocytes Absolute 0.9 0.1 - 1.0 K/uL   Eosinophils Relative 2 %   Eosinophils Absolute 0.2 0.0 - 0.5 K/uL   Basophils Relative 0 %   Basophils Absolute 0.0 0.0 - 0.1 K/uL   Immature Granulocytes 1 %   Abs Immature Granulocytes 0.06 0.00 - 0.07 K/uL    Comment: Performed at [facility], [place] [address], [place], [place] [address]  Protime-INR     Status: None   Collection Time: 07/22/19  5:00 PM  Result Value Ref Range   Prothrombin Time 12.9 11.4 - 15.2 seconds   INR 1.0 0.8 - 1.2    Comment: (NOTE) INR goal varies based on device and disease states. Performed at [facility], [place] [address], [place], [place] [address]    Dg Chest 2 View  Result Date: 07/22/2019 CLINICAL DATA:  Neck swelling EXAM: CHEST - 2 VIEW COMPARISON:  11/25/2018 FINDINGS: Bibasilar atelectasis. Heart is normal size. No effusions. No acute bony abnormality. IMPRESSION: Bibasilar atelectasis. Electronically Signed   By: Rolm Baptise M.D.   On: 07/22/2019 17:29   Ct Soft Tissue Neck W Contrast  Result Date: 07/22/2019 CLINICAL DATA:  Neck swelling for 1 week. EXAM: CT NECK WITH CONTRAST TECHNIQUE: Multidetector CT imaging of the neck was performed using the standard protocol following the bolus administration of intravenous contrast. CONTRAST:  38mL OMNIPAQUE IOHEXOL 300 MG/ML  SOLN COMPARISON:  CT neck 02/06/2019 FINDINGS: PHARYNX AND LARYNX: --Nasopharynx: Fossae of Rosenmuller are clear. Normal adenoid tonsils for age. --Oral cavity and oropharynx: The palatine and lingual tonsils are normal. The visible oral cavity and floor of mouth are normal. --Hypopharynx: Normal vallecula and pyriform sinuses. --Larynx: Normal epiglottis and pre-epiglottic space. Normal aryepiglottic and vocal folds. --Retropharyngeal space: No abscess, effusion or lymphadenopathy. SALIVARY GLANDS: --Parotid: No mass lesion or inflammation.  No sialolithiasis or ductal dilatation. --Submandibular: Left submandibular gland is enlarged. --Sublingual: Normal. No ranula or other visible lesion of the base of tongue and floor of mouth. THYROID: Normal. LYMPH NODES: Left level 1B nodes measure up to 10 mm. VASCULAR: There is thrombosis of the left internal jugular vein along nearly its entire length. At the level of the thyroid cartilage, there is an adjacent abscess that is continuous with the anterior aspect of the internal jugular vein (series 3, image 71) that measures 18 x 20 mm. There is marked swelling of the overlying sternocleidomastoid muscle with inflammatory induration the left neck subcutaneous tissues. LIMITED INTRACRANIAL: Normal. VISUALIZED ORBITS: Normal. MASTOIDS AND VISUALIZED PARANASAL SINUSES: No fluid levels or advanced mucosal thickening. No mastoid effusion. SKELETON: No bony spinal canal stenosis. No lytic or blastic lesions. UPPER CHEST: Clear. OTHER: None. IMPRESSION: 1. 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 2. Left sternocleidomastoid myositis and left neck cellulitis with reactive left cervical lymphadenopathy. Critical Value/emergent results were called by telephone at the time of interpretation on 07/22/2019 at 7:53 pm to Dr. Francine Graven , who verbally acknowledged these results. Electronically Signed   By: Ulyses Jarred M.D.   On: 07/22/2019 19:56    Pending Labs Unresulted Labs (From admission, onward)    Start     Ordered   07/23/19 0500  CBC  Daily,   R     07/22/19 2107   07/23/19 0500  Basic  metabolic panel  Tomorrow morning,   R     07/22/19 2113   07/23/19 0400  Heparin level (unfractionated)  Once-Timed,   STAT     07/22/19 2107   07/22/19 2003  SARS CORONAVIRUS 2 Nasal Swab Aptima Multi Swab  (Asymptomatic/Tier 2 Patients Labs)  Once,   STAT    Question Answer Comment  Is this test for diagnosis or screening Screening   Symptomatic  for COVID-19 as defined by CDC No   Hospitalized for COVID-19 No   Admitted to ICU for COVID-19 No   Previously tested for COVID-19 No   Resident in a congregate (group) care setting No   Employed in healthcare setting No      07/22/19 2002          Vitals/Pain Today's Vitals   07/22/19 1817 07/22/19 2112 07/22/19 2130 07/22/19 2154  BP: 120/71 120/71 119/71   Pulse: 98 88 86   Resp: 16 18 15    Temp:      TempSrc:      SpO2: 98% 100% 100%   Weight:      Height:      PainSc:  9   6     Isolation Precautions No active isolations  Medications Medications  fentaNYL (SUBLIMAZE) injection 50 mcg (50 mcg Intravenous Given 07/22/19 2111)  heparin ADULT infusion 100 units/mL (25000 units/235mL sodium chloride 0.45%) (1,400 Units/hr Intravenous New Bag/Given (Non-Interop) 07/22/19 2119)  buprenorphine (SUBUTEX) sublingual tablet 8 mg (has no administration in time range)  oxyCODONE (Oxy IR/ROXICODONE) immediate release tablet 5 mg (has no administration in time range)  clindamycin (CLEOCIN) IVPB 900 mg (has no administration in time range)  iohexol (OMNIPAQUE) 300 MG/ML solution 75 mL (75 mLs Intravenous Contrast Given 07/22/19 1916)  clindamycin (CLEOCIN) IVPB 900 mg (0 mg Intravenous Stopped 07/22/19 2153)  heparin bolus via infusion 2,000 Units (2,000 Units Intravenous Bolus from Bag 07/22/19 2116)    Mobility walks

## 2019-07-22 NOTE — Progress Notes (Signed)
ANTICOAGULATION CONSULT NOTE - Initial Consult  Pharmacy Consult for IV heparin Indication: VTE treatment  Allergies  Allergen Reactions  . Amoxicillin     Steven-Johnson's Syndrome  . Ceclor [Cefaclor] Other (See Comments)    Steven-Johnson's Syndrome  . Erythromycin     Steven-Johnson's Syndrome  . Penicillins     Steven-Johnson's Syndrome  . Septra [Sulfamethoxazole-Trimethoprim]     Steven-Johnson's Syndrome  . Tylenol [Acetaminophen]     Patient Measurements: Height: 5\' 5"  (165.1 cm) Weight: 160 lb (72.6 kg) IBW/kg (Calculated) : 61.5 Heparin Dosing Weight: 72.6  Vital Signs: Temp: 98.6 F (37 C) (08/23 1538) Temp Source: Oral (08/23 1538) BP: 120/71 (08/23 1817) Pulse Rate: 98 (08/23 1817)  Labs: Recent Labs    07/22/19 1700  HGB 11.7*  HCT 35.9*  PLT 439*  LABPROT 12.9  INR 1.0  CREATININE 0.62    Estimated Creatinine Clearance: 118.5 mL/min (by C-G formula based on SCr of 0.62 mg/dL).   Medical History: Past Medical History:  Diagnosis Date  . Hepatitis C   . History of Stevens-Johnson toxic epidermal necrolysis overlap syndrome    with PCN, not with keflex  . Opiate addiction ([place])    on methadone    Medications:  Scheduled:   Infusions:  . clindamycin (CLEOCIN) IV      Assessment: 28 yo male presented with neck swelling, redness, warmth to start IV heparin per pharmacy for VTE treatment per Md order. Baseline labs drawn.    Goal of Therapy:  Heparin level 0.3-0.7 units/ml Monitor platelets by anticoagulation protocol: Yes   Plan:   IV heparin 2000 unit bolus then   IV heparin 1400 units/hr  Check heparin level 6 hours after start of IV heparin  Daily CBC and heparin level   Adrian Saran, PharmD, BCPS 07/22/2019 9:05 PM

## 2019-07-23 DIAGNOSIS — F17200 Nicotine dependence, unspecified, uncomplicated: Secondary | ICD-10-CM

## 2019-07-23 DIAGNOSIS — Z720 Tobacco use: Secondary | ICD-10-CM

## 2019-07-23 DIAGNOSIS — L0291 Cutaneous abscess, unspecified: Secondary | ICD-10-CM

## 2019-07-23 DIAGNOSIS — M60812 Other myositis, left shoulder: Secondary | ICD-10-CM

## 2019-07-23 DIAGNOSIS — Z881 Allergy status to other antibiotic agents status: Secondary | ICD-10-CM

## 2019-07-23 DIAGNOSIS — B182 Chronic viral hepatitis C: Secondary | ICD-10-CM

## 2019-07-23 DIAGNOSIS — I82C12 Acute embolism and thrombosis of left internal jugular vein: Secondary | ICD-10-CM

## 2019-07-23 DIAGNOSIS — F1190 Opioid use, unspecified, uncomplicated: Secondary | ICD-10-CM

## 2019-07-23 DIAGNOSIS — E876 Hypokalemia: Secondary | ICD-10-CM

## 2019-07-23 DIAGNOSIS — Z880 Allergy status to penicillin: Secondary | ICD-10-CM

## 2019-07-23 DIAGNOSIS — L03221 Cellulitis of neck: Principal | ICD-10-CM

## 2019-07-23 DIAGNOSIS — L0211 Cutaneous abscess of neck: Secondary | ICD-10-CM

## 2019-07-23 DIAGNOSIS — Z888 Allergy status to other drugs, medicaments and biological substances status: Secondary | ICD-10-CM

## 2019-07-23 LAB — BASIC METABOLIC PANEL
Anion gap: 13 (ref 5–15)
BUN: 7 mg/dL (ref 6–20)
CO2: 25 mmol/L (ref 22–32)
Calcium: 8.6 mg/dL — ABNORMAL LOW (ref 8.9–10.3)
Chloride: 100 mmol/L (ref 98–111)
Creatinine, Ser: 0.61 mg/dL (ref 0.61–1.24)
GFR calc Af Amer: >60 mL/min (ref >60)
GFR calc non Af Amer: >60 mL/min (ref >60)
Glucose, Bld: 119 mg/dL — ABNORMAL HIGH (ref 70–99)
Potassium: 3.2 mmol/L — ABNORMAL LOW (ref 3.5–5.1)
Sodium: 138 mmol/L (ref 135–145)

## 2019-07-23 LAB — CBC
HCT: 33.9 % — ABNORMAL LOW (ref 39.0–52.0)
Hemoglobin: 11.1 g/dL — ABNORMAL LOW (ref 13.0–17.0)
MCH: 27.5 pg (ref 26.0–34.0)
MCHC: 32.7 g/dL (ref 30.0–36.0)
MCV: 83.9 fL (ref 80.0–100.0)
Platelets: 401 10*3/uL — ABNORMAL HIGH (ref 150–400)
RBC: 4.04 MIL/uL — ABNORMAL LOW (ref 4.22–5.81)
RDW: 11.8 % (ref 11.5–15.5)
WBC: 9.4 10*3/uL (ref 4.0–10.5)
nRBC: 0 % (ref 0.0–0.2)

## 2019-07-23 LAB — SARS CORONAVIRUS 2 (TAT 6-24 HRS): SARS Coronavirus 2: NEGATIVE

## 2019-07-23 LAB — HEPARIN LEVEL (UNFRACTIONATED)
Heparin Unfractionated: <0.1 IU/mL — ABNORMAL LOW (ref 0.30–0.70)
Heparin Unfractionated: <0.1 IU/mL — ABNORMAL LOW (ref 0.30–0.70)

## 2019-07-23 MED ORDER — METRONIDAZOLE IN NACL 5-0.79 MG/ML-% IV SOLN
500.0000 mg | Freq: Three times a day (TID) | INTRAVENOUS | Status: DC
  Administered 2019-07-23 – 2019-07-25 (×6): 500 mg via INTRAVENOUS
  Filled 2019-07-23 (×6): qty 100

## 2019-07-23 MED ORDER — VANCOMYCIN HCL IN DEXTROSE 1-5 GM/200ML-% IV SOLN
1000.0000 mg | Freq: Three times a day (TID) | INTRAVENOUS | Status: DC
  Administered 2019-07-24 – 2019-07-25 (×5): 1000 mg via INTRAVENOUS
  Filled 2019-07-23 (×5): qty 200

## 2019-07-23 MED ORDER — OXYCODONE HCL 5 MG PO TABS
10.0000 mg | ORAL_TABLET | Freq: Once | ORAL | Status: AC
  Administered 2019-07-23: 10 mg via ORAL
  Filled 2019-07-23: qty 2

## 2019-07-23 MED ORDER — VANCOMYCIN HCL 10 G IV SOLR
1500.0000 mg | INTRAVENOUS | Status: AC
  Administered 2019-07-23: 1500 mg via INTRAVENOUS
  Filled 2019-07-23: qty 1500

## 2019-07-23 MED ORDER — SODIUM CHLORIDE 0.9 % IV SOLN
INTRAVENOUS | Status: DC
  Administered 2019-07-24 (×3): via INTRAVENOUS

## 2019-07-23 MED ORDER — KETOROLAC TROMETHAMINE 15 MG/ML IJ SOLN
15.0000 mg | Freq: Once | INTRAMUSCULAR | Status: AC
  Administered 2019-07-23: 15 mg via INTRAVENOUS
  Filled 2019-07-23: qty 1

## 2019-07-23 MED ORDER — HEPARIN BOLUS VIA INFUSION
2000.0000 [IU] | Freq: Once | INTRAVENOUS | Status: AC
  Administered 2019-07-23: 2000 [IU] via INTRAVENOUS
  Filled 2019-07-23: qty 2000

## 2019-07-23 MED ORDER — SODIUM CHLORIDE 0.9 % IV SOLN
1.0000 g | Freq: Three times a day (TID) | INTRAVENOUS | Status: DC
  Filled 2019-07-23 (×2): qty 1

## 2019-07-23 MED ORDER — KETOROLAC TROMETHAMINE 30 MG/ML IJ SOLN
30.0000 mg | Freq: Once | INTRAMUSCULAR | Status: AC
  Administered 2019-07-23: 30 mg via INTRAVENOUS
  Filled 2019-07-23: qty 1

## 2019-07-23 MED ORDER — LEVOFLOXACIN IN D5W 750 MG/150ML IV SOLN
750.0000 mg | INTRAVENOUS | Status: DC
  Administered 2019-07-23 – 2019-07-24 (×2): 750 mg via INTRAVENOUS
  Filled 2019-07-23 (×3): qty 150

## 2019-07-23 MED ORDER — HEPARIN (PORCINE) 25000 UT/250ML-% IV SOLN
2000.0000 [IU]/h | INTRAVENOUS | Status: DC
  Administered 2019-07-23 – 2019-07-24 (×2): 1700 [IU]/h via INTRAVENOUS
  Filled 2019-07-23 (×3): qty 250

## 2019-07-23 NOTE — Progress Notes (Signed)
ANTICOAGULATION CONSULT NOTE -  Consult  Pharmacy Consult for IV heparin Indication: VTE treatment  Allergies  Allergen Reactions  . Amoxicillin     Steven-Johnson's Syndrome  . Ceclor [Cefaclor] Other (See Comments)    Steven-Johnson's Syndrome  . Erythromycin     Steven-Johnson's Syndrome  . Penicillins     Steven-Johnson's Syndrome  . Septra [Sulfamethoxazole-Trimethoprim]     Steven-Johnson's Syndrome  . Tylenol [Acetaminophen]     Patient Measurements: Height: 5\' 5"  (165.1 cm) Weight: 157 lb 3 oz (71.3 kg) IBW/kg (Calculated) : 61.5 Heparin Dosing Weight: 72.6  Vital Signs: Temp: 98.3 F (36.8 C) (08/24 0557) Temp Source: Oral (08/24 0557) BP: 145/84 (08/24 0557) Pulse Rate: 103 (08/24 0557)  Labs: Recent Labs    07/22/19 1700 07/23/19 0607  HGB 11.7* 11.1*  HCT 35.9* 33.9*  PLT 439* 401*  LABPROT 12.9  --   INR 1.0  --   HEPARINUNFRC  --  <0.10*  CREATININE 0.62  --     Estimated Creatinine Clearance: 118.5 mL/min (by C-G formula based on SCr of 0.62 mg/dL).   Medical History: Past Medical History:  Diagnosis Date  . Hepatitis C   . History of Stevens-Johnson toxic epidermal necrolysis overlap syndrome    with PCN, not with keflex  . Opiate addiction ([place])    on methadone    Medications:  Scheduled:  . buprenorphine  8 mg Sublingual Daily  . heparin  2,000 Units Intravenous Once  . nicotine  14 mg Transdermal Daily  . pneumococcal 23 valent vaccine  0.5 mL Intramuscular Tomorrow-1000   Infusions:  . clindamycin (CLEOCIN) IV 900 mg (07/23/19 0537)  . heparin      Assessment: 28 yo male presented with neck swelling, redness, warmth to start IV heparin per pharmacy for VTE treatment per Md order. Baseline labs drawn.   Today 8/23 0607 HL=<0.10 below goal, no bleeding per RN, heparin was held for 30 mins for clindamycin (was after lab was drawn per RN)  Goal of Therapy:  Heparin level 0.3-0.7 units/ml Monitor platelets by  anticoagulation protocol: Yes   Plan:   IV heparin 2000 unit rebolus x1 now   Increase heparin drip to 1700 units/hr  Recheck HL in 6 hours  Daily CBC and heparin level   Dorrene German 07/23/2019 7:00 AM

## 2019-07-23 NOTE — Progress Notes (Signed)
PROGRESS NOTE                                                                                                                                                                                                             Patient Demographics:    [name], is a 28 y.o. male, DOB - 02-21-1991, ZOX:[id number]  Admit date - 07/22/2019   Admitting Physician Mir Vergie Living, MD  Outpatient Primary MD for the patient is Patient, No Pcp Per  LOS - 1  Outpatient Specialists: None  Chief Complaint  Patient presents with   Neck Pain       Brief Narrative 28 year old male with history of IV drug use, history of neck abscess for which he was hospitalized in March this year and treated with broad-spectrum antibiotic as inpatient and discharged on oral clindamycin, reported history of hep C, alcohol and tobacco use presented with left neck cellulitis and abscess with associated thrombosis of left internal jugular along nearly its entire length. Patient placed on empiric IV antibiotics, IV heparin. ENT and ID consulted.  Subjective:   Patient complains of some pain in the left side of the neck anteriorly.  Wants to eat.   Assessment  & Plan :    Principal Problem: Cellulitis of the neck with phlegmon. Marked swelling of the left anterior neck over the sternocleidomastoid.  On empiric IV clindamycin.  Seen by ENT who recommends patient had improved with IV antibiotics during last hospitalization and to monitor on empiric antibiotics and hold off on surgery unless patient worsens. ID consulted for evaluation and antibiotic recommendation.  Active Problems:    Internal jugular vein thrombosis, left (HCC) On IV heparin.  Was discussed by ED physician with vascular surgery Dr. Chestine Spore who recommended IV heparin and anticoagulation for next 3-6 months due to provoked DVT.  History of IV drug use, hep C Patient reports that he  has not used IV drug prior to last hospitalization in March.  On Suboxone as outpatient which is continued. On reviewing the chart he was seen in the ED about 10 weeks back with progressively worsening pain and discoloration of the thumb and left arm.  He reportedly injected Suboxone into the right lateral forearm.  Doppler ultrasound of the arm was done in the ED which did not show any embolic event and was cleared  for discharge.  Hypokalemia Replenished  Tobacco use Counseled on cessation.  Nicotine patch.    Code Status : Full code  Family Communication  : None at bedside  Disposition Plan  : Home once improved  Barriers For Discharge : Active symptoms  Consults  : ENT (Dr. Pollyann Kennedy), ID  Procedures  : CT of the neck  DVT Prophylaxis  : IV heparin  Lab Results  Component Value Date   PLT 401 (H) 07/23/2019    Antibiotics  :   Anti-infectives (From admission, onward)   Start     Dose/Rate Route Frequency Ordered Stop   07/23/19 0600  clindamycin (CLEOCIN) IVPB 900 mg     900 mg 100 mL/hr over 30 Minutes Intravenous Every 8 hours 07/22/19 2114     07/22/19 2045  clindamycin (CLEOCIN) IVPB 900 mg     900 mg 100 mL/hr over 30 Minutes Intravenous  Once 07/22/19 2040 07/22/19 2153        Objective:   Vitals:   07/22/19 2130 07/22/19 2200 07/22/19 2227 07/23/19 0557  BP: 119/71 111/67 116/75 (!) 145/84  Pulse: 86 86 100 (!) 103  Resp: 15 14 16 17   Temp:   98.5 F (36.9 C) 98.3 F (36.8 C)  TempSrc:   Oral Oral  SpO2: 100% 100% 100% 99%  Weight:   71.3 kg   Height:   5\' 5"  (1.651 m)     Wt Readings from Last 3 Encounters:  07/22/19 71.3 kg  02/07/19 73.8 kg  10/16/14 68 kg     Intake/Output Summary (Last 24 hours) at 07/23/2019 1231 Last data filed at 07/23/2019 1123 Gross per 24 hour  Intake 435.67 ml  Output --  Net 435.67 ml     Physical Exam  Gen: not in distress HEENT: Moist mucosa, area of swelling over the left anterior neck overlying the  sternocleidomastoid muscle with induration but no fluctuation or discharge Chest: clear b/l, no added sounds CVS: N S1&S2, no murmurs,  GI: soft, NT, ND,  Musculoskeletal: warm, no edema     Data Review:    CBC Recent Labs  Lab 07/22/19 1700 07/23/19 0607  WBC 10.4 9.4  HGB 11.7* 11.1*  HCT 35.9* 33.9*  PLT 439* 401*  MCV 84.3 83.9  MCH 27.5 27.5  MCHC 32.6 32.7  RDW 11.9 11.8  LYMPHSABS 1.5  --   MONOABS 0.9  --   EOSABS 0.2  --   BASOSABS 0.0  --     Chemistries  Recent Labs  Lab 07/22/19 1700 07/23/19 0607  NA 137 138  K 3.8 3.2*  CL 97* 100  CO2 28 25  GLUCOSE 112* 119*  BUN 7 7  CREATININE 0.62 0.61  CALCIUM 8.9 8.6*  AST 10*  --   ALT 12  --   ALKPHOS 74  --   BILITOT 0.5  --    ------------------------------------------------------------------------------------------------------------------ No results for input(s): CHOL, HDL, LDLCALC, TRIG, CHOLHDL, LDLDIRECT in the last 72 hours.  No results found for: HGBA1C ------------------------------------------------------------------------------------------------------------------ No results for input(s): TSH, T4TOTAL, T3FREE, THYROIDAB in the last 72 hours.  Invalid input(s): FREET3 ------------------------------------------------------------------------------------------------------------------ No results for input(s): VITAMINB12, FOLATE, FERRITIN, TIBC, IRON, RETICCTPCT in the last 72 hours.  Coagulation profile Recent Labs  Lab 07/22/19 1700  INR 1.0    No results for input(s): DDIMER in the last 72 hours.  Cardiac Enzymes No results for input(s): CKMB, TROPONINI, MYOGLOBIN in the last 168 hours.  Invalid input(s): CK ------------------------------------------------------------------------------------------------------------------ No results  found for: BNP  Inpatient Medications  Scheduled Meds:  buprenorphine  8 mg Sublingual Daily   nicotine  14 mg Transdermal Daily   Continuous  Infusions:  clindamycin (CLEOCIN) IV 900 mg (07/23/19 0537)   heparin 1,700 Units/hr (07/23/19 1138)   PRN Meds:.oxyCODONE  Micro Results Recent Results (from the past 240 hour(s))  SARS CORONAVIRUS 2 Nasal Swab Aptima Multi Swab     Status: None   Collection Time: 07/22/19  8:03 PM   Specimen: Aptima Multi Swab; Nasal Swab  Result Value Ref Range Status   SARS Coronavirus 2 NEGATIVE NEGATIVE Final    Comment: (NOTE) SARS-CoV-2 target nucleic acids are NOT DETECTED. The SARS-CoV-2 RNA is generally detectable in upper and lower respiratory specimens during the acute phase of infection. Negative results do not preclude SARS-CoV-2 infection, do not rule out co-infections with other pathogens, and should not be used as the sole basis for treatment or other patient management decisions. Negative results must be combined with clinical observations, patient history, and epidemiological information. The expected result is Negative. Fact Sheet for Patients: HairSlick.no Fact Sheet for Healthcare Providers: quierodirigir.com This test is not yet approved or cleared by the [place] FDA and  has been authorized for detection and/or diagnosis of SARS-CoV-2 by FDA under an Emergency Use Authorization (EUA). This EUA will remain  in effect (meaning this test can be used) for the duration of the COVID-19 declaration under Section 56 4(b)(1) of the Act, 21 U.S.C. section 360bbb-3(b)(1), unless the authorization is terminated or revoked sooner. Performed at [facility], [address] [place], [place] [address]     Radiology Reports Dg Chest 2 View  Result Date: 07/22/2019 CLINICAL DATA:  Neck swelling EXAM: CHEST - 2 VIEW COMPARISON:  11/25/2018 FINDINGS: Bibasilar atelectasis. Heart is normal size. No effusions. No acute bony abnormality. IMPRESSION: Bibasilar atelectasis. Electronically Signed   By: Charlett Nose M.D.    On: 07/22/2019 17:29   Ct Soft Tissue Neck W Contrast  Result Date: 07/22/2019 CLINICAL DATA:  Neck swelling for 1 week. EXAM: CT NECK WITH CONTRAST TECHNIQUE: Multidetector CT imaging of the neck was performed using the standard protocol following the bolus administration of intravenous contrast. CONTRAST:  75mL OMNIPAQUE IOHEXOL 300 MG/ML  SOLN COMPARISON:  CT neck 02/06/2019 FINDINGS: PHARYNX AND LARYNX: --Nasopharynx: Fossae of Rosenmuller are clear. Normal adenoid tonsils for age. --Oral cavity and oropharynx: The palatine and lingual tonsils are normal. The visible oral cavity and floor of mouth are normal. --Hypopharynx: Normal vallecula and pyriform sinuses. --Larynx: Normal epiglottis and pre-epiglottic space. Normal aryepiglottic and vocal folds. --Retropharyngeal space: No abscess, effusion or lymphadenopathy. SALIVARY GLANDS: --Parotid: No mass lesion or inflammation. No sialolithiasis or ductal dilatation. --Submandibular: Left submandibular gland is enlarged. --Sublingual: Normal. No ranula or other visible lesion of the base of tongue and floor of mouth. THYROID: Normal. LYMPH NODES: Left level 1B nodes measure up to 10 mm. VASCULAR: There is thrombosis of the left internal jugular vein along nearly its entire length. At the level of the thyroid cartilage, there is an adjacent abscess that is continuous with the anterior aspect of the internal jugular vein (series 3, image 71) that measures 18 x 20 mm. There is marked swelling of the overlying sternocleidomastoid muscle with inflammatory induration the left neck subcutaneous tissues. LIMITED INTRACRANIAL: Normal. VISUALIZED ORBITS: Normal. MASTOIDS AND VISUALIZED PARANASAL SINUSES: No fluid levels or advanced mucosal thickening. No mastoid effusion. SKELETON: No bony spinal canal stenosis. No lytic or blastic  lesions. UPPER CHEST: Clear. OTHER: None. IMPRESSION: 1. Thrombosis of the left internal jugular vein with adjacent abscess at the level of  the thyroid cartilage. 2. Left sternocleidomastoid myositis and left neck cellulitis with reactive left cervical lymphadenopathy. Critical Value/emergent results were called by telephone at the time of interpretation on 07/22/2019 at 7:53 pm to Dr. Samuel Jester , who verbally acknowledged these results. Electronically Signed   By: Deatra Robinson M.D.   On: 07/22/2019 19:56    Time Spent in minutes 35   Harlie Buening M.D on 07/23/2019 at 12:31 PM  Between 7am to 7pm - Pager - [phone]  After 7pm go to [email or web address] - password [facility]  [facility] -  Office  [phone]

## 2019-07-23 NOTE — Progress Notes (Signed)
Pharmacy Antibiotic Note  [name] is a 27 y.o. male admitted on 07/22/2019 with neck abscess.  Pharmacy has been consulted for vancomycin dosing.  Pt has PCN allergy (reaction: SJS) - ID following. 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    Allergies  Allergen Reactions  . Amoxicillin     Steven-Johnson's Syndrome  . Ceclor [Cefaclor] Other (See Comments)    Steven-Johnson's Syndrome  . Erythromycin     Steven-Johnson's Syndrome  . Penicillins     Steven-Johnson's Syndrome  . Septra [Sulfamethoxazole-Trimethoprim]     Steven-Johnson's Syndrome  . Tylenol [Acetaminophen]     Antimicrobials this admission: vancomycin 8/24 >>  levofloxacin 8/24 >>  Metronidazole 8/24 >>  Dose adjustments this admission:  Microbiology results: 8/24 MRSA PCR: Sent 8/23 SARS-2: Negative  Thank you for allowing pharmacy to be a part of this patient's care.  Lenis Noon, PharmD 07/23/2019 5:34 PM

## 2019-07-23 NOTE — Progress Notes (Signed)
Informed by lab, 6 unsuccessful blood sticks occurred by 2 lab techs. Lab unable to drawn blood cultures and also heparin level. Lab stated patients blood isn't too thick and they are unsure why they are not able to draw blood. Heparin and antibotics running. Will page on call.  Patient's pain still uncontrolled. Oxy given at 2014. Not due until 0015. Received order for one time dose oxy. Given at 2220. Patient still 8/10. Will continue to monitor.

## 2019-07-23 NOTE — Consult Note (Signed)
Reason for Consult: Neck abscess Referring Physician: Louellen Molder, MD  [name] is an 28 y.o. male.  HPI: Long history of intravenous drug abuse and multiple bouts of skin infection and abscess.  Last seen by our practice as a consult back in March for neck abscess that recovered with intravenous antibiotics.  He has had increasing swelling of his neck for several weeks but it got much worse a couple of days ago.  He was admitted late last night.  CT scan reveals thrombosed internal jugular vein and large area of phlegmon/cellulitis and small abscess seen in the left lower neck.  Past Medical History:  Diagnosis Date  . Hepatitis C   . History of Stevens-Johnson toxic epidermal necrolysis overlap syndrome    with PCN, not with keflex  . 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 TYMPANOSTOMY TUBE PLACEMENT     36-3 years old    Family History  Problem Relation Age of Onset  . Diabetes Other        grandparents  . Stroke Other   . Pancreatic cancer Other   . Diabetes Mellitus II Other     Social History:  reports that he has been smoking. He has been smoking about 0.00 packs per day for the past 5.00 years. He has never used smokeless tobacco. He reports current drug use. Drug: Marijuana. He reports that he does not drink alcohol.  Allergies:  Allergies  Allergen Reactions  . Amoxicillin     Steven-Johnson's Syndrome  . Ceclor [Cefaclor] Other (See Comments)    Steven-Johnson's Syndrome  . Erythromycin     Steven-Johnson's Syndrome  . Penicillins     Steven-Johnson's Syndrome  . Septra [Sulfamethoxazole-Trimethoprim]     Steven-Johnson's Syndrome  . Tylenol [Acetaminophen]     Medications: Reviewed  Results for orders placed or performed during the hospital encounter of 07/22/19 (from the past 48 hour(s))  Acetaminophen level     Status: Abnormal   Collection Time: 07/22/19  5:00 PM  Result Value Ref  Range   Acetaminophen (Tylenol), Serum <10 (L) 10 - 30 ug/mL    Comment: (NOTE) Therapeutic concentrations vary significantly. A range of 10-30 ug/mL  may be an effective concentration for many patients. However, some  are best treated at concentrations outside of this range. Acetaminophen concentrations >150 ug/mL at 4 hours after ingestion  and >50 ug/mL at 12 hours after ingestion are often associated with  toxic reactions. Performed at [facility], [place] [address], [place], [place] [address]   Comprehensive metabolic panel     Status: Abnormal   Collection Time: 07/22/19  5:00 PM  Result Value Ref Range   Sodium 137 135 - 145 mmol/L   Potassium 3.8 3.5 - 5.1 mmol/L   Chloride 97 (L) 98 - 111 mmol/L   CO2 28 22 - 32 mmol/L   Glucose, Bld 112 (H) 70 - 99 mg/dL   BUN 7 6 - 20 mg/dL   Creatinine, Ser 0.62 0.61 - 1.24 mg/dL   Calcium 8.9 8.9 - 10.3 mg/dL   Total Protein 8.1 6.5 - 8.1 g/dL   Albumin 3.6 3.5 - 5.0 g/dL   AST 10 (L) 15 - 41 U/L   ALT 12 0 - 44 U/L   Alkaline Phosphatase 74 38 - 126 U/L   Total Bilirubin 0.5 0.3 - 1.2 mg/dL   GFR calc non Af Amer >60 >60  mL/min   GFR calc Af Amer >60 >60 mL/min   Anion gap 12 5 - 15    Comment: Performed at [facility], [place] [address] [place], [place] [address]  Ethanol     Status: None   Collection Time: 07/22/19  5:00 PM  Result Value Ref Range   Alcohol, Ethyl (B) <10 <10 mg/dL    Comment: (NOTE) Lowest detectable limit for serum alcohol is 10 mg/dL. For medical purposes only. Performed at [facility], [place] [address], [place], [place] [address]   Urine rapid drug screen (hosp performed)     Status: Abnormal   Collection Time: 07/22/19  5:00 PM  Result Value Ref Range   Opiates NONE DETECTED NONE DETECTED   Cocaine NONE DETECTED NONE DETECTED   Benzodiazepines POSITIVE (A) NONE DETECTED   Amphetamines NONE DETECTED NONE DETECTED   Tetrahydrocannabinol NONE  DETECTED NONE DETECTED   Barbiturates NONE DETECTED NONE DETECTED    Comment: (NOTE) DRUG SCREEN FOR MEDICAL PURPOSES ONLY.  IF CONFIRMATION IS NEEDED FOR ANY PURPOSE, NOTIFY LAB WITHIN 5 DAYS. LOWEST DETECTABLE LIMITS FOR URINE DRUG SCREEN Drug Class                     Cutoff (ng/mL) Amphetamine and metabolites    1000 Barbiturate and metabolites    200 Benzodiazepine                 [id number] Tricyclics and metabolites     300 Opiates and metabolites        300 Cocaine and metabolites        300 THC                            50 Performed at [facility], [place] [address], [place], [place] [id number]   Salicylate level     Status: None   Collection Time: 07/22/19  5:00 PM  Result Value Ref Range   Salicylate Lvl [id number] 2.8 - 30.0 mg/dL    Comment: Performed at [facility], [place] [address], [place], [place] [address]  CBC with Differential     Status: Abnormal   Collection Time: 07/22/19  5:00 PM  Result Value Ref Range   WBC 10.4 4.0 - 10.5 K/uL   RBC 4.26 4.22 - 5.81 MIL/uL   Hemoglobin 11.7 (L) 13.0 - 17.0 g/dL   HCT 35.9 (L) 39.0 - 52.0 %   MCV 84.3 80.0 - 100.0 fL   MCH 27.5 26.0 - 34.0 pg   MCHC 32.6 30.0 - 36.0 g/dL   RDW 11.9 11.5 - 15.5 %   Platelets 439 (H) 150 - 400 K/uL   nRBC 0.0 0.0 - 0.2 %   Neutrophils Relative % 75 %   Neutro Abs 7.8 (H) 1.7 - 7.7 K/uL   Lymphocytes Relative 14 %   Lymphs Abs 1.5 0.7 - 4.0 K/uL   Monocytes Relative 8 %   Monocytes Absolute 0.9 0.1 - 1.0 K/uL   Eosinophils Relative 2 %   Eosinophils Absolute 0.2 0.0 - 0.5 K/uL   Basophils Relative 0 %   Basophils Absolute 0.0 0.0 - 0.1 K/uL   Immature Granulocytes 1 %   Abs Immature Granulocytes 0.06 0.00 - 0.07 K/uL    Comment: Performed at [facility], [place] [address], [place], [place] [address]  Protime-INR     Status: None   Collection  Time: 07/22/19  5:00 PM  Result Value Ref Range   Prothrombin Time 12.9 11.4 - 15.2 seconds    INR 1.0 0.8 - 1.2    Comment: (NOTE) INR goal varies based on device and disease states. Performed at [facility], [place] [address] [place], [place] [address]   Heparin level (unfractionated)     Status: Abnormal   Collection Time: 07/23/19  6:07 AM  Result Value Ref Range   Heparin Unfractionated <0.10 (L) 0.30 - 0.70 IU/mL    Comment: (NOTE) If heparin results are below expected values, and patient dosage has  been confirmed, suggest follow up testing of antithrombin III levels. Performed at [facility], [place] [address], [place], [place] [address]   CBC     Status: Abnormal   Collection Time: 07/23/19  6:07 AM  Result Value Ref Range   WBC 9.4 4.0 - 10.5 K/uL   RBC 4.04 (L) 4.22 - 5.81 MIL/uL   Hemoglobin 11.1 (L) 13.0 - 17.0 g/dL   HCT 33.9 (L) 39.0 - 52.0 %   MCV 83.9 80.0 - 100.0 fL   MCH 27.5 26.0 - 34.0 pg   MCHC 32.7 30.0 - 36.0 g/dL   RDW 11.8 11.5 - 15.5 %   Platelets 401 (H) 150 - 400 K/uL   nRBC 0.0 0.0 - 0.2 %    Comment: Performed at [facility], [place] [address] [place], [place] [address]    Dg Chest 2 View  Result Date: 07/22/2019 CLINICAL DATA:  Neck swelling EXAM: CHEST - 2 VIEW COMPARISON:  11/25/2018 FINDINGS: Bibasilar atelectasis. Heart is normal size. No effusions. No acute bony abnormality. IMPRESSION: Bibasilar atelectasis. Electronically Signed   By: Rolm Baptise M.D.   On: 07/22/2019 17:29   Ct Soft Tissue Neck W Contrast  Result Date: 07/22/2019 CLINICAL DATA:  Neck swelling for 1 week. EXAM: CT NECK WITH CONTRAST TECHNIQUE: Multidetector CT imaging of the neck was performed using the standard protocol following the bolus administration of intravenous contrast. CONTRAST:  16mL OMNIPAQUE IOHEXOL 300 MG/ML  SOLN COMPARISON:  CT neck 02/06/2019 FINDINGS: PHARYNX AND LARYNX: --Nasopharynx: Fossae of Rosenmuller are clear. Normal adenoid tonsils for age. --Oral cavity and oropharynx: The palatine and  lingual tonsils are normal. The visible oral cavity and floor of mouth are normal. --Hypopharynx: Normal vallecula and pyriform sinuses. --Larynx: Normal epiglottis and pre-epiglottic space. Normal aryepiglottic and vocal folds. --Retropharyngeal space: No abscess, effusion or lymphadenopathy. SALIVARY GLANDS: --Parotid: No mass lesion or inflammation. No sialolithiasis or ductal dilatation. --Submandibular: Left submandibular gland is enlarged. --Sublingual: Normal. No ranula or other visible lesion of the base of tongue and floor of mouth. THYROID: Normal. LYMPH NODES: Left level 1B nodes measure up to 10 mm. VASCULAR: There is thrombosis of the left internal jugular vein along nearly its entire length. At the level of the thyroid cartilage, there is an adjacent abscess that is continuous with the anterior aspect of the internal jugular vein (series 3, image 71) that measures 18 x 20 mm. There is marked swelling of the overlying sternocleidomastoid muscle with inflammatory induration the left neck subcutaneous tissues. LIMITED INTRACRANIAL: Normal. VISUALIZED ORBITS: Normal. MASTOIDS AND VISUALIZED PARANASAL SINUSES: No fluid levels or advanced mucosal thickening. No mastoid effusion. SKELETON: No bony spinal canal stenosis. No lytic or blastic lesions. UPPER CHEST: Clear. OTHER: None. IMPRESSION: 1. 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 2. Left sternocleidomastoid myositis  and left neck cellulitis with reactive left cervical lymphadenopathy. Critical Value/emergent results were called by telephone at the time of interpretation on 07/22/2019 at 7:53 pm to Dr. Francine Graven , who verbally acknowledged these results. Electronically Signed   By: Ulyses Jarred M.D.   On: 07/22/2019 19:56    [id number] except as listed in admit H&P  Blood pressure (!) 145/84, pulse (!) 103, temperature 98.3 F (36.8 C), temperature source Oral, resp. rate 17,  height 5\' 5"  (1.651 m), weight 71.3 kg, SpO2 99 %.  PHYSICAL EXAM: Overall appearance:  Healthy appearing, in no distress.  Awake and alert and able to answer questions. Head:  Normocephalic, atraumatic. Ears: External ears look healthy. Nose: External nose is healthy in appearance. Internal nasal exam free of any lesions or obstruction. Oral Cavity/Pharynx:  There are no mucosal lesions or masses identified.  There is no edema of the oral cavity, floor of mouth or pharynx. Larynx/Hypopharynx: Deferred Neuro:  No identifiable neurologic deficits. Neck: Mildly tender swelling of the left lower neck area, supraclavicular region.  There is no erythema of the skin.  There is no obvious fluctuance but it is firm.  Upper neck without any obvious swelling or mass..  Studies Reviewed: Neck CT reviewed.  Findings reported.  Procedures: none   Assessment/Plan: Chronic intravenous drug use with history of multiple skin infections and abscess.  Currently with significant cellulitis and phlegmon of the left lower neck with thrombosis of the internal jugular vein and a small area of low density seen on CT scan.  Clinically stable.  He has cleared in the past with aggressive intravenous antibiotic use.  Recommend infectious disease consultation.  We will watch the neck and hold off on surgical intervention unless it gets worse.  Izora Gala 07/23/2019, 7:16 AM

## 2019-07-23 NOTE — Progress Notes (Signed)
Patient's pain uncontrolled. Oxy given. Pain still 8/10. No other PRNs ordered. Patient asks for Toradol. Will page on call for one time dose.

## 2019-07-23 NOTE — Consult Note (Addendum)
[facility]  Total days of antibiotics 2 clindamycin               Reason for Consult:  Left IJ thrombus iwht adjacent abscess and L SCM myositis   Referring Physician:  Dhungel  Principal Problem:   Neck abscess Active Problems:   Cellulitis   IV drug abuse ([place])   Hepatitis C   Tobacco abuse   Internal jugular vein thrombosis, left (HCC)   Thrombocytosis (HCC)   Anemia    HPI: [name] is a 28 y.o. male with hx of chronic hepatitis c, hx of PWID/opiate use, who was last admitted in march 2020 or neck abscess that excluded venous thrombus associated with idu, treated with oral abtx. In May , he was seen for discoloration and pain to right hand/wrist after idu but now returns to hospital with c/o left neck pain, with fever/chills for roughly 10d. He denies recent drug use. Area of neck swollen and painful per his report. On admit, he was afebrile, WBC WNL, imaging did show stranding in the area, LN enlarged with thrombosis along the near entire length of left internal jugular vein. Small abscess at level of thyroid and myositis to SCM. He was seen by ENT recommends admission but did not feel that he needed debridement. Vascular surgery recommends 3-6 months of anticoagulation but heparin for now in case he needs surgery. He was started on clindamycin since he has risk of S-J syndrome with penicillins. No recent dental work  Past Medical History:  Diagnosis Date  . Hepatitis C   . History of Stevens-Johnson toxic epidermal necrolysis overlap syndrome    with PCN, not with keflex  . Opiate addiction ([place])    on methadone    Allergies:  Allergies  Allergen Reactions  . Amoxicillin     Steven-Johnson's Syndrome  . Ceclor [Cefaclor] Other (See Comments)    Steven-Johnson's Syndrome  . Erythromycin     Steven-Johnson's Syndrome  . Penicillins     Steven-Johnson's Syndrome  . Septra [Sulfamethoxazole-Trimethoprim]     Steven-Johnson's Syndrome   . Tylenol [Acetaminophen]      MEDICATIONS: . buprenorphine  8 mg Sublingual Daily  . nicotine  14 mg Transdermal Daily    Social History   Tobacco Use  . Smoking status: Current Every Day Smoker    Packs/day: 0.00    Years: 5.00    Pack years: 0.00  . Smokeless tobacco: Never Used  Substance Use Topics  . Alcohol use: No  . Drug use: Yes    Types: Marijuana    Family History  Problem Relation Age of Onset  . Diabetes Other        grandparents  . Stroke Other   . Pancreatic cancer Other   . Diabetes Mellitus II Other     Review of Systems -  Constitutional: positive for fever, chills, diaphoresis, activity change, appetite change, fatigue and unexpected weight change.  HENT: Negative for congestion, sore throat, rhinorrhea, sneezing, trouble swallowing and sinus pressure.  Eyes: Negative for photophobia and visual disturbance.  Respiratory: Negative for cough, chest tightness, shortness of breath, wheezing and stridor.  Cardiovascular: Negative for chest pain, palpitations and leg swelling.  Gastrointestinal: Negative for nausea, vomiting, abdominal pain, diarrhea, constipation, blood in stool, abdominal distention and anal bleeding.  Genitourinary: Negative for dysuria, hematuria, flank pain and difficulty urinating.  Musculoskeletal: Negative for myalgias, back pain, joint swelling, arthralgias and gait problem.  Skin: +rash, swelling  to neck-left side Neurological: Negative for dizziness, tremors, weakness and light-headedness.  Hematological: Negative for adenopathy. Does not bruise/bleed easily.  Psychiatric/Behavioral: Negative for behavioral problems, confusion, sleep disturbance, dysphoric mood, decreased concentration and agitation.      OBJECTIVE: Temp:  [98 F (36.7 C)-98.5 F (36.9 C)] 98 F (36.7 C) (08/24 1436) Pulse Rate:  [86-103] 100 (08/24 1436) Resp:  [14-18] 18 (08/24 1436) BP: (111-145)/(67-84) 129/84 (08/24 1436) SpO2:  [98 %-100 %] 100  % (08/24 1436) Weight:  [71.3 kg] 71.3 kg (08/23 2227) Physical Exam  Constitutional: He is oriented to person, place, and time. He appears well-developed and well-nourished. No distress.  HENT: erythema to left neck Mouth/Throat: Oropharynx is clear and moist. No oropharyngeal exudate.  Cardiovascular: Normal rate, regular rhythm and normal heart sounds. Exam reveals no gallop and no friction rub.  No murmur heard.  Pulmonary/Chest: Effort normal and breath sounds normal. No respiratory distress. He has no wheezes.  Abdominal: Soft. Bowel sounds are normal. He exhibits no distension. There is no tenderness.  Lymphadenopathy:  He has no cervical adenopathy.  Neurological: He is alert and oriented to person, place, and time.  Skin: Skin is warm and dry. No rash noted. No erythema.  Psychiatric: He has a normal mood and affect. His behavior is normal.     LABS: Results for orders placed or performed during the hospital encounter of 07/22/19 (from the past 48 hour(s))  Acetaminophen level     Status: Abnormal   Collection Time: 07/22/19  5:00 PM  Result Value Ref Range   Acetaminophen (Tylenol), Serum <10 (L) 10 - 30 ug/mL    Comment: (NOTE) Therapeutic concentrations vary significantly. A range of 10-30 ug/mL  may be an effective concentration for many patients. However, some  are best treated at concentrations outside of this range. Acetaminophen concentrations >150 ug/mL at 4 hours after ingestion  and >50 ug/mL at 12 hours after ingestion are often associated with  toxic reactions. Performed at [facility], [place] [address] [place], [place] [address]   Comprehensive metabolic panel     Status: Abnormal   Collection Time: 07/22/19  5:00 PM  Result Value Ref Range   Sodium 137 135 - 145 mmol/L   Potassium 3.8 3.5 - 5.1 mmol/L   Chloride 97 (L) 98 - 111 mmol/L   CO2 28 22 - 32 mmol/L   Glucose, Bld 112 (H) 70 - 99 mg/dL   BUN 7 6 - 20 mg/dL   Creatinine,  Ser 0.62 0.61 - 1.24 mg/dL   Calcium 8.9 8.9 - 10.3 mg/dL   Total Protein 8.1 6.5 - 8.1 g/dL   Albumin 3.6 3.5 - 5.0 g/dL   AST 10 (L) 15 - 41 U/L   ALT 12 0 - 44 U/L   Alkaline Phosphatase 74 38 - 126 U/L   Total Bilirubin 0.5 0.3 - 1.2 mg/dL   GFR calc non Af Amer >60 >60 mL/min   GFR calc Af Amer >60 >60 mL/min   Anion gap 12 5 - 15    Comment: Performed at [facility], [place] [address] [place], [place] [address]  Ethanol     Status: None   Collection Time: 07/22/19  5:00 PM  Result Value Ref Range   Alcohol, Ethyl (B) <10 <10 mg/dL    Comment: (NOTE) Lowest detectable limit for serum alcohol is 10 mg/dL. For medical purposes only. Performed at [facility], [place] [address], [place], [place] [address]  Urine rapid drug screen (hosp performed)     Status: Abnormal   Collection Time: 07/22/19  5:00 PM  Result Value Ref Range   Opiates NONE DETECTED NONE DETECTED   Cocaine NONE DETECTED NONE DETECTED   Benzodiazepines POSITIVE (A) NONE DETECTED   Amphetamines NONE DETECTED NONE DETECTED   Tetrahydrocannabinol NONE DETECTED NONE DETECTED   Barbiturates NONE DETECTED NONE DETECTED    Comment: (NOTE) DRUG SCREEN FOR MEDICAL PURPOSES ONLY.  IF CONFIRMATION IS NEEDED FOR ANY PURPOSE, NOTIFY LAB WITHIN 5 DAYS. LOWEST DETECTABLE LIMITS FOR URINE DRUG SCREEN Drug Class                     Cutoff (ng/mL) Amphetamine and metabolites    1000 Barbiturate and metabolites    200 Benzodiazepine                 [id number] Tricyclics and metabolites     300 Opiates and metabolites        300 Cocaine and metabolites        300 THC                            50 Performed at [facility], [place] [address] [place], [place] [id number]   Salicylate level     Status: None   Collection Time: 07/22/19  5:00 PM  Result Value Ref Range   Salicylate Lvl [id number] 2.8 - 30.0 mg/dL    Comment: Performed at [facility], [place]  [address], [place], [place] [address]  CBC with Differential     Status: Abnormal   Collection Time: 07/22/19  5:00 PM  Result Value Ref Range   WBC 10.4 4.0 - 10.5 K/uL   RBC 4.26 4.22 - 5.81 MIL/uL   Hemoglobin 11.7 (L) 13.0 - 17.0 g/dL   HCT 35.9 (L) 39.0 - 52.0 %   MCV 84.3 80.0 - 100.0 fL   MCH 27.5 26.0 - 34.0 pg   MCHC 32.6 30.0 - 36.0 g/dL   RDW 11.9 11.5 - 15.5 %   Platelets 439 (H) 150 - 400 K/uL   nRBC 0.0 0.0 - 0.2 %   Neutrophils Relative % 75 %   Neutro Abs 7.8 (H) 1.7 - 7.7 K/uL   Lymphocytes Relative 14 %   Lymphs Abs 1.5 0.7 - 4.0 K/uL   Monocytes Relative 8 %   Monocytes Absolute 0.9 0.1 - 1.0 K/uL   Eosinophils Relative 2 %   Eosinophils Absolute 0.2 0.0 - 0.5 K/uL   Basophils Relative 0 %   Basophils Absolute 0.0 0.0 - 0.1 K/uL   Immature Granulocytes 1 %   Abs Immature Granulocytes 0.06 0.00 - 0.07 K/uL    Comment: Performed at [facility], [place] [address] [place], [place] [address]  Protime-INR     Status: None   Collection Time: 07/22/19  5:00 PM  Result Value Ref Range   Prothrombin Time 12.9 11.4 - 15.2 seconds   INR 1.0 0.8 - 1.2    Comment: (NOTE) INR goal varies based on device and disease states. Performed at [facility], [place] [address], [place], [place] [address]   SARS CORONAVIRUS 2 Nasal Swab Aptima Multi Swab     Status: None   Collection Time: 07/22/19  8:03 PM   Specimen: Aptima Multi Swab; Nasal Swab  Result Value Ref Range   SARS Coronavirus 2 NEGATIVE NEGATIVE  Comment: (NOTE) SARS-CoV-2 target nucleic acids are NOT DETECTED. The SARS-CoV-2 RNA is generally detectable in upper and lower respiratory specimens during the acute phase of infection. Negative results do not preclude SARS-CoV-2 infection, do not rule out co-infections with other pathogens, and should not be used as the sole basis for treatment or other patient management decisions. Negative results must be combined with clinical  observations, patient history, and epidemiological information. The expected result is Negative. Fact Sheet for Patients: SugarRoll.be Fact Sheet for Healthcare Providers: https://www.woods-mathews.com/ This test is not yet approved or cleared by the [place] FDA and  has been authorized for detection and/or diagnosis of SARS-CoV-2 by FDA under an Emergency Use Authorization (EUA). This EUA will remain  in effect (meaning this test can be used) for the duration of the COVID-19 declaration under Section 56 4(b)(1) of the Act, 21 U.S.C. section 360bbb-3(b)(1), unless the authorization is terminated or revoked sooner. Performed at [facility], [place] [address] [place], [place] [address]   Heparin level (unfractionated)     Status: Abnormal   Collection Time: 07/23/19  6:07 AM  Result Value Ref Range   Heparin Unfractionated <0.10 (L) 0.30 - 0.70 IU/mL    Comment: (NOTE) If heparin results are below expected values, and patient dosage has  been confirmed, suggest follow up testing of antithrombin III levels. Performed at [facility], [place] [address] [place], [place] [address]   CBC     Status: Abnormal   Collection Time: 07/23/19  6:07 AM  Result Value Ref Range   WBC 9.4 4.0 - 10.5 K/uL   RBC 4.04 (L) 4.22 - 5.81 MIL/uL   Hemoglobin 11.1 (L) 13.0 - 17.0 g/dL   HCT 33.9 (L) 39.0 - 52.0 %   MCV 83.9 80.0 - 100.0 fL   MCH 27.5 26.0 - 34.0 pg   MCHC 32.7 30.0 - 36.0 g/dL   RDW 11.8 11.5 - 15.5 %   Platelets 401 (H) 150 - 400 K/uL   nRBC 0.0 0.0 - 0.2 %    Comment: Performed at [facility], [place] [address], [place], [place] [id number]  Basic metabolic panel     Status: Abnormal   Collection Time: 07/23/19  6:07 AM  Result Value Ref Range   Sodium 138 135 - 145 mmol/L   Potassium 3.2 (L) 3.5 - 5.1 mmol/L   Chloride 100 98 - 111 mmol/L   CO2 25 22 - 32 mmol/L   Glucose, Bld 119 (H) 70 - 99  mg/dL   BUN 7 6 - 20 mg/dL   Creatinine, Ser 0.61 0.61 - 1.24 mg/dL   Calcium 8.6 (L) 8.9 - 10.3 mg/dL   GFR calc non Af Amer >60 >60 mL/min   GFR calc Af Amer >60 >60 mL/min   Anion gap 13 5 - 15    Comment: Performed at [facility], [place] [address], [place], [place] [address]  Heparin level (unfractionated)     Status: Abnormal   Collection Time: 07/23/19  2:39 PM  Result Value Ref Range   Heparin Unfractionated <0.10 (L) 0.30 - 0.70 IU/mL    Comment: (NOTE) If heparin results are below expected values, and patient dosage has  been confirmed, suggest follow up testing of antithrombin III levels. Performed at [facility], [place] [address] [place], [place] [address]     MICRO:  IMAGING: Dg Chest 2 View  Result Date: 07/22/2019 CLINICAL DATA:  Neck swelling EXAM:  CHEST - 2 VIEW COMPARISON:  11/25/2018 FINDINGS: Bibasilar atelectasis. Heart is normal size. No effusions. No acute bony abnormality. IMPRESSION: Bibasilar atelectasis. Electronically Signed   By: Rolm Baptise M.D.   On: 07/22/2019 17:29   Ct Soft Tissue Neck W Contrast  Result Date: 07/22/2019 CLINICAL DATA:  Neck swelling for 1 week. EXAM: CT NECK WITH CONTRAST TECHNIQUE: Multidetector CT imaging of the neck was performed using the standard protocol following the bolus administration of intravenous contrast. CONTRAST:  96mL OMNIPAQUE IOHEXOL 300 MG/ML  SOLN COMPARISON:  CT neck 02/06/2019 FINDINGS: PHARYNX AND LARYNX: --Nasopharynx: Fossae of [place]er are clear. Normal adenoid tonsils for age. --Oral cavity and oropharynx: The palatine and lingual tonsils are normal. The visible oral cavity and floor of mouth are normal. --Hypopharynx: Normal vallecula and pyriform sinuses. --Larynx: Normal epiglottis and pre-epiglottic space. Normal aryepiglottic and vocal folds. --Retropharyngeal space: No abscess, effusion or lymphadenopathy. SALIVARY GLANDS: --Parotid: No mass lesion or  inflammation. No sialolithiasis or ductal dilatation. --Submandibular: Left submandibular gland is enlarged. --Sublingual: Normal. No ranula or other visible lesion of the base of tongue and floor of mouth. THYROID: Normal. LYMPH NODES: Left level 1B nodes measure up to 10 mm. VASCULAR: There is thrombosis of the left internal jugular vein along nearly its entire length. At the level of the thyroid cartilage, there is an adjacent abscess that is continuous with the anterior aspect of the internal jugular vein (series 3, image 71) that measures 18 x 20 mm. There is marked swelling of the overlying sternocleidomastoid muscle with inflammatory induration the left neck subcutaneous tissues. LIMITED INTRACRANIAL: Normal. VISUALIZED ORBITS: Normal. MASTOIDS AND VISUALIZED PARANASAL SINUSES: No fluid levels or advanced mucosal thickening. No mastoid effusion. SKELETON: No bony spinal canal stenosis. No lytic or blastic lesions. UPPER CHEST: Clear. OTHER: None. IMPRESSION: 1. 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 2. Left sternocleidomastoid myositis and left neck cellulitis with reactive left cervical lymphadenopathy. Critical Value/emergent results were called by telephone at the time of interpretation on 07/22/2019 at 7:53 pm to Dr. Francine Graven , who verbally acknowledged these results. Electronically Signed   By: Ulyses Jarred M.D.   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 Unfortunately, no blood culture was not done to see if he was bacteremic.  - recommend to change abtx to vancomycin, levofloxacin,plus metronidazole for broad coverage - will check blood cx - recommend to get TTE - chest CT to see if lesion/extend to chest to cause pulm septic emboli - hx of ig e mediated reaction to PCN - will see if can do skin testing for carbapenem (as  outpatient/or see if any feasibility as inpatient) - for now will give iv abtx -> plan to change to orals once ready for discharge - due to clot burden - getting 3-6 mo worth of anticoagulation  Chronic hep c = will check hep c GT and VL to see if interested in treatment as outpatient

## 2019-07-23 NOTE — Progress Notes (Signed)
ANTICOAGULATION CONSULT NOTE -  Consult  Pharmacy Consult for IV heparin Indication: VTE treatment  Allergies  Allergen Reactions  . Amoxicillin     Steven-Johnson's Syndrome  . Ceclor [Cefaclor] Other (See Comments)    Steven-Johnson's Syndrome  . Erythromycin     Steven-Johnson's Syndrome  . Penicillins     Steven-Johnson's Syndrome  . Septra [Sulfamethoxazole-Trimethoprim]     Steven-Johnson's Syndrome  . Tylenol [Acetaminophen]     Patient Measurements: Height: 5\' 5"  (165.1 cm) Weight: 157 lb 3 oz (71.3 kg) IBW/kg (Calculated) : 61.5 Heparin Dosing Weight: 72.6  Vital Signs: Temp: 98 F (36.7 C) (08/24 1436) Temp Source: Oral (08/24 1436) BP: 129/84 (08/24 1436) Pulse Rate: 100 (08/24 1436)  Labs: Recent Labs    07/22/19 1700 07/23/19 0607 07/23/19 1439  HGB 11.7* 11.1*  --   HCT 35.9* 33.9*  --   PLT 439* 401*  --   LABPROT 12.9  --   --   INR 1.0  --   --   HEPARINUNFRC  --  <0.10* <0.10*  CREATININE 0.62 0.61  --     Estimated Creatinine Clearance: 118.5 mL/min (by C-G formula based on SCr of 0.61 mg/dL).   Medications:  Scheduled:  . buprenorphine  8 mg Sublingual Daily  . nicotine  14 mg Transdermal Daily   Infusions:  . clindamycin (CLEOCIN) IV 900 mg (07/23/19 1346)  . heparin 1,700 Units/hr (07/23/19 1138)    Assessment: 28 yo male presented with neck swelling, redness, warmth to start IV heparin per pharmacy for VTE treatment per Md order. Baseline labs drawn.   Today, 07/23/2019:  CBC: Hgb low but stable; Plt mildly elevated  Most recent heparin level remains undetectable despite rate increase to 1700 units/hr - RN is concerned about possible infiltration of IV site; would like to relocate site  No bleeding issues per nursing  Goal of Therapy:  Heparin level 0.3-0.7 units/ml Monitor platelets by anticoagulation protocol: Yes   Plan:   Suspect IV site may have infiltrated; RN to relocate IV site and will resume heparin at  current rate of 1700 units/hr  Recheck HL in 6 hours  Daily CBC and heparin level   Reuel Boom, PharmD, BCPS [phone] 07/23/2019, 3:41 PM

## 2019-07-24 ENCOUNTER — Inpatient Hospital Stay (HOSPITAL_COMMUNITY): Payer: Medicaid Other

## 2019-07-24 DIAGNOSIS — R6510 Systemic inflammatory response syndrome (SIRS) of non-infectious origin without acute organ dysfunction: Secondary | ICD-10-CM

## 2019-07-24 DIAGNOSIS — R7881 Bacteremia: Secondary | ICD-10-CM

## 2019-07-24 LAB — ECHOCARDIOGRAM COMPLETE
Height: 65 in
Weight: 2515.01 oz

## 2019-07-24 LAB — CBC
HCT: 33 % — ABNORMAL LOW (ref 39.0–52.0)
Hemoglobin: 10.7 g/dL — ABNORMAL LOW (ref 13.0–17.0)
MCH: 27.9 pg (ref 26.0–34.0)
MCHC: 32.4 g/dL (ref 30.0–36.0)
MCV: 85.9 fL (ref 80.0–100.0)
Platelets: 390 10*3/uL (ref 150–400)
RBC: 3.84 MIL/uL — ABNORMAL LOW (ref 4.22–5.81)
RDW: 11.8 % (ref 11.5–15.5)
WBC: 11 10*3/uL — ABNORMAL HIGH (ref 4.0–10.5)
nRBC: 0 % (ref 0.0–0.2)

## 2019-07-24 LAB — CREATININE, SERUM
Creatinine, Ser: 0.75 mg/dL (ref 0.61–1.24)
GFR calc Af Amer: >60 mL/min (ref >60)
GFR calc non Af Amer: >60 mL/min (ref >60)

## 2019-07-24 LAB — HEPARIN LEVEL (UNFRACTIONATED)
Heparin Unfractionated: <0.1 IU/mL — ABNORMAL LOW (ref 0.30–0.70)
Heparin Unfractionated: <0.1 IU/mL — ABNORMAL LOW (ref 0.30–0.70)

## 2019-07-24 MED ORDER — SODIUM CHLORIDE (PF) 0.9 % IJ SOLN
INTRAMUSCULAR | Status: AC
  Filled 2019-07-24: qty 50

## 2019-07-24 MED ORDER — LEVETIRACETAM 500 MG PO TABS
500.0000 mg | ORAL_TABLET | Freq: Two times a day (BID) | ORAL | Status: DC
  Administered 2019-07-24 – 2019-07-25 (×3): 500 mg via ORAL
  Filled 2019-07-24 (×3): qty 1

## 2019-07-24 MED ORDER — IBUPROFEN 200 MG PO TABS
400.0000 mg | ORAL_TABLET | Freq: Once | ORAL | Status: AC
  Administered 2019-07-24: 400 mg via ORAL
  Filled 2019-07-24: qty 2

## 2019-07-24 MED ORDER — ADULT MULTIVITAMIN W/MINERALS CH
1.0000 | ORAL_TABLET | Freq: Every day | ORAL | Status: DC
  Administered 2019-07-24 – 2019-07-25 (×2): 1 via ORAL
  Filled 2019-07-24 (×2): qty 1

## 2019-07-24 MED ORDER — IOHEXOL 300 MG/ML  SOLN
75.0000 mL | Freq: Once | INTRAMUSCULAR | Status: AC | PRN
  Administered 2019-07-24: 20:00:00 75 mL via INTRAVENOUS

## 2019-07-24 MED ORDER — OXYCODONE HCL 5 MG PO TABS
10.0000 mg | ORAL_TABLET | ORAL | Status: DC | PRN
  Administered 2019-07-24 – 2019-07-25 (×5): 10 mg via ORAL
  Filled 2019-07-24 (×5): qty 2

## 2019-07-24 MED ORDER — HEPARIN BOLUS VIA INFUSION
2000.0000 [IU] | Freq: Once | INTRAVENOUS | Status: AC
  Administered 2019-07-24: 2000 [IU] via INTRAVENOUS
  Filled 2019-07-24: qty 2000

## 2019-07-24 MED ORDER — HEPARIN BOLUS VIA INFUSION
4000.0000 [IU] | Freq: Once | INTRAVENOUS | Status: AC
  Administered 2019-07-24: 4000 [IU] via INTRAVENOUS
  Filled 2019-07-24: qty 4000

## 2019-07-24 MED ORDER — SODIUM CHLORIDE 0.9% FLUSH
10.0000 mL | Freq: Two times a day (BID) | INTRAVENOUS | Status: DC
  Administered 2019-07-24 – 2019-07-25 (×2): 10 mL

## 2019-07-24 MED ORDER — ENSURE ENLIVE PO LIQD
237.0000 mL | Freq: Two times a day (BID) | ORAL | Status: DC
  Administered 2019-07-24: 237 mL via ORAL

## 2019-07-24 MED ORDER — SODIUM CHLORIDE 0.9 % IV BOLUS
500.0000 mL | Freq: Once | INTRAVENOUS | Status: AC
  Administered 2019-07-24: 06:00:00 500 mL via INTRAVENOUS

## 2019-07-24 MED ORDER — SODIUM CHLORIDE 0.9% FLUSH: 10.0000 mL | INTRAVENOUS | Status: DC | PRN

## 2019-07-24 MED ORDER — HEPARIN (PORCINE) 25000 UT/250ML-% IV SOLN
2800.0000 [IU]/h | INTRAVENOUS | Status: DC
  Administered 2019-07-24 – 2019-07-25 (×2): 2800 [IU]/h via INTRAVENOUS
  Filled 2019-07-24 (×3): qty 250

## 2019-07-24 NOTE — Progress Notes (Signed)
Patient vitals are as follows. MD paged. Will administer Tylenol as ordered. Antibiotics administered as ordered, no delay. Pain uncontrolled. Received call from pharmacy, heparin bolus ordered and rate change. Yellow MEWS initiated.    Vital Signs MEWS/VS Documentation      07/23/2019 1924 07/23/2019 2009 07/23/2019 2015 07/24/2019 0354   MEWS Score:  0  0  0  3   MEWS Score Color:  Green  Green  Green  Yellow   Resp:  -  17  -  15   Pulse:  -  91  -  (!) 108   BP:  -  112/73  -  126/82   Temp:  -  98.9 F (37.2 C)  -  (!) 101.8 F (38.8 C)   O2 Device:  -  Room Air  -  Room Air   Level of Consciousness:  -  -  Alert  -           Jorene Minors 07/24/2019,4:00 AM

## 2019-07-24 NOTE — Progress Notes (Signed)
While administering medication, pt c/o headache. Pt mother states "You know he has seizures, we've been waiting to tell the doctor." Mother states pt was diagnosed with epilepsy at age 28, but "kind of grew out of it." Pt reports begun to have seizures after head injury in 2016. Pt and mother states that pt had seizure 04/2019 after daughter born and again on June 11, 2019 and "coded down in [facility] ER." Pt mother states pt was prescribed medication in the ER, but was unable to afford to follow up with a neurologist. Pt states he was given Keppra, but is unsure of dose and has been out since 07/08/19.  Room dimmed, mother at bedside, primary nurse informed.

## 2019-07-24 NOTE — Progress Notes (Signed)
ANTICOAGULATION CONSULT NOTE - Follow Up Consult  Pharmacy Consult for Heparin Indication: VTE Treatment  Allergies  Allergen Reactions  . Amoxicillin     Steven-Johnson's Syndrome  . Ceclor [Cefaclor] Other (See Comments)    Steven-Johnson's Syndrome  . Erythromycin     Steven-Johnson's Syndrome  . Penicillins     Steven-Johnson's Syndrome  . Septra [Sulfamethoxazole-Trimethoprim]     Steven-Johnson's Syndrome  . Tylenol [Acetaminophen]     Patient Measurements: Height: 5\' 5"  (165.1 cm) Weight: 157 lb 3 oz (71.3 kg) IBW/kg (Calculated) : 61.5 Heparin Dosing Weight:   Vital Signs: Temp: 101.8 F (38.8 C) (08/25 0354) Temp Source: Oral (08/25 0354) BP: 126/82 (08/25 0354) Pulse Rate: 108 (08/25 0354)  Labs: Recent Labs    07/22/19 1700 07/23/19 0607 07/23/19 1439 07/24/19 0026  HGB 11.7* 11.1*  --   --   HCT 35.9* 33.9*  --   --   PLT 439* 401*  --   --   LABPROT 12.9  --   --   --   INR 1.0  --   --   --   HEPARINUNFRC  --  <0.10* <0.10* <0.10*  CREATININE 0.62 0.61  --   --     Estimated Creatinine Clearance: 118.5 mL/min (by C-G formula based on SCr of 0.61 mg/dL).   Medications:  Infusions:  . sodium chloride Stopped (07/24/19 0221)  . heparin 2,000 Units/hr (07/24/19 0356)  . levofloxacin (LEVAQUIN) IV 750 mg (07/23/19 2200)  . metronidazole 500 mg (07/24/19 0357)  . sodium chloride    . vancomycin 1,000 mg (07/24/19 0221)    Assessment: Patient with low heparin level again.  No heparin issues per RN.  Goal of Therapy:  Heparin level 0.3-0.7 units/ml Monitor platelets by anticoagulation protocol: Yes   Plan:  Heparin bolus 2000 units iv x1 Heparin drip at 2000 units/hr Daily CBC Next heparin level at [place], Stem Crowford 07/24/2019,4:06 AM

## 2019-07-24 NOTE — Progress Notes (Signed)
  Echocardiogram 2D Echocardiogram has been performed.  [name] M 07/24/2019, 10:09 AM

## 2019-07-24 NOTE — Progress Notes (Signed)
[name] for Infectious Disease    Date of Admission:  07/22/2019   Total days of antibiotics 3/day 1 vanco/levofloxacin/metronidazole           ID: [name] is a 28 y.o. male with neck abscess with Left IJ thrombosis Principal Problem:   Neck abscess Active Problems:   Cellulitis   IV drug abuse (HCC)   Hepatitis C   Tobacco abuse   Internal jugular vein thrombosis, left (HCC)   Thrombocytosis (HCC)   Anemia    Subjective: he reports that neck is still painful due to swelling - appears unchanged since being admitted per patient. Not necessarily feeling like he is having fevers. But had temp of 101F last night. Difficulty with swallowing  Medications:  . buprenorphine  8 mg Sublingual Daily  . feeding supplement (ENSURE ENLIVE)  237 mL Oral BID BM  . levETIRAcetam  500 mg Oral BID  . multivitamin with minerals  1 tablet Oral Daily  . nicotine  14 mg Transdermal Daily    Objective: Vital signs in last 24 hours: Temp:  [98 F (36.7 C)-101.8 F (38.8 C)] 98.4 F (36.9 C) (08/25 1348) Pulse Rate:  [84-108] 84 (08/25 1348) Resp:  [15-20] 16 (08/25 1348) BP: (112-126)/(71-87) 122/78 (08/25 1348) SpO2:  [98 %-100 %] 100 % (08/25 1348) Weight:  [70.4 kg] 70.4 kg (08/25 1320)   Physical Exam  Constitutional: He is oriented to person, place, and time. He appears well-developed and well-nourished. No distress.  HENT:  Mouth/Throat: Oropharynx is clear and moist. No oropharyngeal exudate.  Cardiovascular: Normal rate, regular rhythm and normal heart sounds. Exam reveals no gallop and no friction rub.  No murmur heard.  Neck = swollen/endurated throughout left side of neck Pulmonary/Chest: Effort normal and breath sounds normal. No respiratory distress. He has no wheezes.  Abdominal: Soft. Bowel sounds are normal. He exhibits no distension. There is no tenderness.  Lymphadenopathy:  He has no cervical adenopathy.  Neurological: He is alert and oriented to  person, place, and time.  Skin: Skin is warm and dry. No rash noted. No erythema.  Psychiatric: He has a normal mood and affect. His behavior is normal.     Lab Results Recent Labs    07/22/19 1700 07/23/19 0607 07/24/19 1050  WBC 10.4 9.4 11.0*  HGB 11.7* 11.1* 10.7*  HCT 35.9* 33.9* 33.0*  NA 137 138  --   K 3.8 3.2*  --   CL 97* 100  --   CO2 28 25  --   BUN 7 7  --   CREATININE 0.62 0.61 0.75   Liver Panel Recent Labs    07/22/19 1700  PROT 8.1  ALBUMIN 3.6  AST 10*  ALT 12  ALKPHOS 74  BILITOT 0.5   No results found for: ESRSEDRATE, [place]  Microbiology: none Studies/Results: Dg Chest 2 View  Result Date: 07/22/2019 CLINICAL DATA:  Neck swelling EXAM: CHEST - 2 VIEW COMPARISON:  11/25/2018 FINDINGS: Bibasilar atelectasis. Heart is normal size. No effusions. No acute bony abnormality. IMPRESSION: Bibasilar atelectasis. Electronically Signed   By: Rolm Baptise M.D.   On: 07/22/2019 17:29   Ct Soft Tissue Neck W Contrast  Result Date: 07/22/2019 CLINICAL DATA:  Neck swelling for 1 week. EXAM: CT NECK WITH CONTRAST TECHNIQUE: Multidetector CT imaging of the neck was performed using the standard protocol following the bolus administration of intravenous contrast. CONTRAST:  21mL OMNIPAQUE IOHEXOL 300 MG/ML  SOLN COMPARISON:  CT neck 02/06/2019  FINDINGS: PHARYNX AND LARYNX: --Nasopharynx: Fossae of Rosenmuller are clear. Normal adenoid tonsils for age. --Oral cavity and oropharynx: The palatine and lingual tonsils are normal. The visible oral cavity and floor of mouth are normal. --Hypopharynx: Normal vallecula and pyriform sinuses. --Larynx: Normal epiglottis and pre-epiglottic space. Normal aryepiglottic and vocal folds. --Retropharyngeal space: No abscess, effusion or lymphadenopathy. SALIVARY GLANDS: --Parotid: No mass lesion or inflammation. No sialolithiasis or ductal dilatation. --Submandibular: Left submandibular gland is enlarged. --Sublingual: Normal. No  ranula or other visible lesion of the base of tongue and floor of mouth. THYROID: Normal. LYMPH NODES: Left level 1B nodes measure up to 10 mm. VASCULAR: There is thrombosis of the left internal jugular vein along nearly its entire length. At the level of the thyroid cartilage, there is an adjacent abscess that is continuous with the anterior aspect of the internal jugular vein (series 3, image 71) that measures 18 x 20 mm. There is marked swelling of the overlying sternocleidomastoid muscle with inflammatory induration the left neck subcutaneous tissues. LIMITED INTRACRANIAL: Normal. VISUALIZED ORBITS: Normal. MASTOIDS AND VISUALIZED PARANASAL SINUSES: No fluid levels or advanced mucosal thickening. No mastoid effusion. SKELETON: No bony spinal canal stenosis. No lytic or blastic lesions. UPPER CHEST: Clear. OTHER: None. IMPRESSION: 1. 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 2. Left sternocleidomastoid myositis and left neck cellulitis with reactive left cervical lymphadenopathy. Critical Value/emergent results were called by telephone at the time of interpretation on 07/22/2019 at 7:53 pm to Dr. Francine Graven , who verbally acknowledged these results. Electronically Signed   By: Ulyses Jarred M.D.   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 Unfortunately, no blood culture was not done to see if he was bacteremic.  - continue with vancomycin, levofloxacin,plus metronidazole for broad coverage - consider repeat neck CT along with chest CT to see if lesion/extend to chest to cause pulm septic emboli  - for now will give iv abtx -> plan to change to orals once ready for discharge - due to clot burden - getting 3-6 mo worth of anticoagulation  Chronic hep c = awaiting hep c GT and VL to see if interested in treatment as outpatient  [facility] Diseases Cell: [phone] Pager: [phone]  07/24/2019, 3:06 PM

## 2019-07-24 NOTE — Progress Notes (Signed)
ANTICOAGULATION CONSULT NOTE -  Consult  Pharmacy Consult for IV heparin Indication: VTE treatment  Allergies  Allergen Reactions  . Amoxicillin     Steven-Johnson's Syndrome  . Ceclor [Cefaclor] Other (See Comments)    Steven-Johnson's Syndrome  . Erythromycin     Steven-Johnson's Syndrome  . Penicillins     Steven-Johnson's Syndrome  . Septra [Sulfamethoxazole-Trimethoprim]     Steven-Johnson's Syndrome  . Tylenol [Acetaminophen]     Patient Measurements: Height: 5\' 5"  (165.1 cm) Weight: 155 lb 3.2 oz (70.4 kg) IBW/kg (Calculated) : 61.5 Heparin Dosing Weight: 72.6  Vital Signs: Temp: 98.4 F (36.9 C) (08/25 1348) Temp Source: Oral (08/25 1348) BP: 122/78 (08/25 1348) Pulse Rate: 84 (08/25 1348)  Labs: Recent Labs    07/22/19 1700  07/23/19 0607 07/23/19 1439 07/24/19 0026 07/24/19 1050  HGB 11.7*  --  11.1*  --   --  10.7*  HCT 35.9*  --  33.9*  --   --  33.0*  PLT 439*  --  401*  --   --  390  LABPROT 12.9  --   --   --   --   --   INR 1.0  --   --   --   --   --   HEPARINUNFRC  --    < > <0.10* <0.10* <0.10* <0.10*  CREATININE 0.62  --  0.61  --   --  0.75   < > = values in this interval not displayed.    Estimated Creatinine Clearance: 118.5 mL/min (by C-G formula based on SCr of 0.75 mg/dL).   Medications:  Scheduled:  . buprenorphine  8 mg Sublingual Daily  . feeding supplement (ENSURE ENLIVE)  237 mL Oral BID BM  . levETIRAcetam  500 mg Oral BID  . multivitamin with minerals  1 tablet Oral Daily  . nicotine  14 mg Transdermal Daily   Infusions:  . sodium chloride 30 mL/hr at 07/24/19 1123  . heparin 2,800 Units/hr (07/24/19 1428)  . levofloxacin (LEVAQUIN) IV 750 mg (07/23/19 2200)  . metronidazole 500 mg (07/24/19 1135)  . vancomycin 1,000 mg (07/24/19 0933)    Assessment: 28 yo male presented with neck swelling, redness, warmth to start IV heparin per pharmacy for VTE treatment per Md order. Baseline labs drawn.   Today,  07/24/2019:  CBC: Hgb low but stable; WBC mildly elevated; ptl wnl  HL remains subtherapeutic despite multiple rate increases  No bleeding or infusion issues per RN  MD to continue heparin for now in case surgery is needed   Goal of Therapy:  Heparin level 0.3-0.7 units/ml Monitor platelets by anticoagulation protocol: Yes   Plan:   Heparin 4,000 unit bolus x1   Increase heparin rate to 2,800 units/hr  Recheck HL in 6 hours  Daily CBC and heparin level  Jonathon Bellows, PharmD Candidate 07/24/2019 3:07 PM

## 2019-07-24 NOTE — Progress Notes (Signed)
PROGRESS NOTE                                                                                                                                                                                                             Patient Demographics:    [name], is a 28 y.o. male, DOB - 1991-04-11, ZOX:[id number]  Admit date - 07/22/2019   Admitting Physician Mir Vergie Living, MD  Outpatient Primary MD for the patient is Patient, No Pcp Per  LOS - 2  Outpatient Specialists: None  Chief Complaint  Patient presents with   Neck Pain       Brief Narrative 28 year old male with history of IV drug use, history of neck abscess for which he was hospitalized in March this year and treated with broad-spectrum antibiotic as inpatient and discharged on oral clindamycin, reported history of hep C, alcohol and tobacco use presented with left neck cellulitis and abscess with associated thrombosis of left internal jugular along nearly its entire length. Patient placed on empiric IV antibiotics, IV heparin. ENT and ID consulted.  Subjective:   Complains of worsening pain in the neck and swelling extending towards the side of the neck.  Had fever of 101.2F.  Denies difficulty swallowing.   Assessment  & Plan :    Principal Problem: SIRS with cellulitis of the neck with phlegmon. Marked swelling of the left anterior neck over the sternocleidomastoid.  Appears worsened on exam today.  No fluctuation.  ID consult appreciated and switched clindamycin to empiric coverage with IV vancomycin, Levaquin and Flagyl. Order 2D echo to rule out vegetation and CT of the chest to evaluate for extension of the infection. Follow blood culture. Seen by ENT following admission and recommended monitoring with IV antibiotic and possible surgery if worsening.  Will ask them to evaluate again.  Complaints of increasing pain.  Oxycodone dose increased to 10  mg every 4 hours as needed.  Use Toradol as needed.  Active Problems:    Internal jugular vein thrombosis, left (HCC) On IV heparin.  Was discussed by ED physician with vascular surgery Dr. Chestine Spore who recommended IV heparin and anticoagulation for next 3-6 months due to provoked DVT.  History of IV drug use, hep C Patient reports that he has not used IV drug prior to last hospitalization in March.  On Suboxone as  outpatient which is continued. On reviewing the chart he was seen in the ED about 10 weeks back with progressively worsening pain and discoloration of the thumb and left arm.  He reportedly injected Suboxone into the right lateral forearm.  Doppler ultrasound of the arm was done in the ED which did not show any embolic event and was cleared for discharge.  Hep C RNA and genotype ordered.  Hep C RNA and genotype ordered.  Hypokalemia Replenished  Tobacco use Counseled on cessation.  Nicotine patch.    Code Status : Full code  Family Communication  : Mother at bedside  Disposition Plan  : Home once improved  Barriers For Discharge : Active symptoms  Consults  : ENT (Dr. Pollyann Kennedy), ID  Procedures  : CT of the neck  DVT Prophylaxis  : IV heparin  Lab Results  Component Value Date   PLT 390 07/24/2019    Antibiotics  :   Anti-infectives (From admission, onward)   Start     Dose/Rate Route Frequency Ordered Stop   07/24/19 0200  vancomycin (VANCOCIN) IVPB 1000 mg/200 mL premix     1,000 mg 200 mL/hr over 60 Minutes Intravenous Every 8 hours 07/23/19 1734     07/23/19 2000  metroNIDAZOLE (FLAGYL) IVPB 500 mg     500 mg 100 mL/hr over 60 Minutes Intravenous Every 8 hours 07/23/19 1752     07/23/19 1830  meropenem (MERREM) 1 g in sodium chloride 0.9 % 100 mL IVPB  Status:  Discontinued     1 g 200 mL/hr over 30 Minutes Intravenous Every 8 hours 07/23/19 1721 07/23/19 1749   07/23/19 1830  levofloxacin (LEVAQUIN) IVPB 750 mg     750 mg 100 mL/hr over 90 Minutes  Intravenous Every 24 hours 07/23/19 1752     07/23/19 1745  vancomycin (VANCOCIN) 1,500 mg in sodium chloride 0.9 % 500 mL IVPB     1,500 mg 250 mL/hr over 120 Minutes Intravenous STAT 07/23/19 1722 07/23/19 2007   07/23/19 0600  clindamycin (CLEOCIN) IVPB 900 mg  Status:  Discontinued     900 mg 100 mL/hr over 30 Minutes Intravenous Every 8 hours 07/22/19 2114 07/23/19 1707   07/22/19 2045  clindamycin (CLEOCIN) IVPB 900 mg     900 mg 100 mL/hr over 30 Minutes Intravenous  Once 07/22/19 2040 07/22/19 2153        Objective:   Vitals:   07/24/19 0354 07/24/19 0518 07/24/19 0826 07/24/19 1137  BP: 126/82 124/76 117/71 124/87  Pulse: (!) 108  94 93  Resp: 15  20 16   Temp: (!) 101.8 F (38.8 C) 98.8 F (37.1 C) 98 F (36.7 C) 98.5 F (36.9 C)  TempSrc: Oral Oral  Oral  SpO2: 100% 100% 100% 98%  Weight:      Height:        Wt Readings from Last 3 Encounters:  07/22/19 71.3 kg  02/07/19 73.8 kg  10/16/14 68 kg     Intake/Output Summary (Last 24 hours) at 07/24/2019 1347 Last data filed at 07/24/2019 1042 Gross per 24 hour  Intake 2384.51 ml  Output --  Net 2384.51 ml    Physical exam Not in distress HEENT: Area of swelling and induration over left anterior neck, extending towards the side of the neck, tender but no fluctuation Chest: Clear bilaterally CVS: Normal S1-S2, no murmurs GI: Soft, nondistended, nontender Musculoskeletal: Warm, no edema    Data Review:    CBC Recent Labs  Lab 07/22/19  1700 07/23/19 0607 07/24/19 1050  WBC 10.4 9.4 11.0*  HGB 11.7* 11.1* 10.7*  HCT 35.9* 33.9* 33.0*  PLT 439* 401* 390  MCV 84.3 83.9 85.9  MCH 27.5 27.5 27.9  MCHC 32.6 32.7 32.4  RDW 11.9 11.8 11.8  LYMPHSABS 1.5  --   --   MONOABS 0.9  --   --   EOSABS 0.2  --   --   BASOSABS 0.0  --   --     Chemistries  Recent Labs  Lab 07/22/19 1700 07/23/19 0607 07/24/19 1050  NA 137 138  --   K 3.8 3.2*  --   CL 97* 100  --   CO2 28 25  --   GLUCOSE 112*  119*  --   BUN 7 7  --   CREATININE 0.62 0.61 0.75  CALCIUM 8.9 8.6*  --   AST 10*  --   --   ALT 12  --   --   ALKPHOS 74  --   --   BILITOT 0.5  --   --    ------------------------------------------------------------------------------------------------------------------ No results for input(s): CHOL, HDL, LDLCALC, TRIG, CHOLHDL, LDLDIRECT in the last 72 hours.  No results found for: HGBA1C ------------------------------------------------------------------------------------------------------------------ No results for input(s): TSH, T4TOTAL, T3FREE, THYROIDAB in the last 72 hours.  Invalid input(s): FREET3 ------------------------------------------------------------------------------------------------------------------ No results for input(s): VITAMINB12, FOLATE, FERRITIN, TIBC, IRON, RETICCTPCT in the last 72 hours.  Coagulation profile Recent Labs  Lab 07/22/19 1700  INR 1.0    No results for input(s): DDIMER in the last 72 hours.  Cardiac Enzymes No results for input(s): CKMB, TROPONINI, MYOGLOBIN in the last 168 hours.  Invalid input(s): CK ------------------------------------------------------------------------------------------------------------------ No results found for: BNP  Inpatient Medications  Scheduled Meds:  buprenorphine  8 mg Sublingual Daily   feeding supplement (ENSURE ENLIVE)  237 mL Oral BID BM   levETIRAcetam  500 mg Oral BID   multivitamin with minerals  1 tablet Oral Daily   nicotine  14 mg Transdermal Daily   Continuous Infusions:  sodium chloride 30 mL/hr at 07/24/19 1123   heparin 2,000 Units/hr (07/24/19 0356)   levofloxacin (LEVAQUIN) IV 750 mg (07/23/19 2200)   metronidazole 500 mg (07/24/19 1135)   vancomycin 1,000 mg (07/24/19 0933)   PRN Meds:.oxyCODONE  Micro Results Recent Results (from the past 240 hour(s))  SARS CORONAVIRUS 2 Nasal Swab Aptima Multi Swab     Status: None   Collection Time: 07/22/19  8:03 PM    Specimen: Aptima Multi Swab; Nasal Swab  Result Value Ref Range Status   SARS Coronavirus 2 NEGATIVE NEGATIVE Final    Comment: (NOTE) SARS-CoV-2 target nucleic acids are NOT DETECTED. The SARS-CoV-2 RNA is generally detectable in upper and lower respiratory specimens during the acute phase of infection. Negative results do not preclude SARS-CoV-2 infection, do not rule out co-infections with other pathogens, and should not be used as the sole basis for treatment or other patient management decisions. Negative results must be combined with clinical observations, patient history, and epidemiological information. The expected result is Negative. Fact Sheet for Patients: HairSlick.no Fact Sheet for Healthcare Providers: quierodirigir.com This test is not yet approved or cleared by the [place] FDA and  has been authorized for detection and/or diagnosis of SARS-CoV-2 by FDA under an Emergency Use Authorization (EUA). This EUA will remain  in effect (meaning this test can be used) for the duration of the COVID-19 declaration under Section 56 4(b)(1) of the Act, 21 U.S.C. section  360bbb-3(b)(1), unless the authorization is terminated or revoked sooner. Performed at [facility], [address] [place], [place] [address]   Culture, blood (Routine X 2) w Reflex to ID Panel     Status: None (Preliminary result)   Collection Time: 07/24/19 12:26 AM   Specimen: BLOOD  Result Value Ref Range Status   Specimen Description   Final    BLOOD BLOOD RIGHT HAND Performed at [facility], [address] [place], [place] [address]    Special Requests   Final    BOTTLES DRAWN AEROBIC ONLY Blood Culture adequate volume Performed at [facility], [address] [place], [place] [address]    Culture PENDING  Incomplete   Report Status PENDING  Incomplete    Radiology Reports Dg Chest 2 View  Result Date:  07/22/2019 CLINICAL DATA:  Neck swelling EXAM: CHEST - 2 VIEW COMPARISON:  11/25/2018 FINDINGS: Bibasilar atelectasis. Heart is normal size. No effusions. No acute bony abnormality. IMPRESSION: Bibasilar atelectasis. Electronically Signed   By: Charlett Nose M.D.   On: 07/22/2019 17:29   Ct Soft Tissue Neck W Contrast  Result Date: 07/22/2019 CLINICAL DATA:  Neck swelling for 1 week. EXAM: CT NECK WITH CONTRAST TECHNIQUE: Multidetector CT imaging of the neck was performed using the standard protocol following the bolus administration of intravenous contrast. CONTRAST:  75mL OMNIPAQUE IOHEXOL 300 MG/ML  SOLN COMPARISON:  CT neck 02/06/2019 FINDINGS: PHARYNX AND LARYNX: --Nasopharynx: Fossae of Rosenmuller are clear. Normal adenoid tonsils for age. --Oral cavity and oropharynx: The palatine and lingual tonsils are normal. The visible oral cavity and floor of mouth are normal. --Hypopharynx: Normal vallecula and pyriform sinuses. --Larynx: Normal epiglottis and pre-epiglottic space. Normal aryepiglottic and vocal folds. --Retropharyngeal space: No abscess, effusion or lymphadenopathy. SALIVARY GLANDS: --Parotid: No mass lesion or inflammation. No sialolithiasis or ductal dilatation. --Submandibular: Left submandibular gland is enlarged. --Sublingual: Normal. No ranula or other visible lesion of the base of tongue and floor of mouth. THYROID: Normal. LYMPH NODES: Left level 1B nodes measure up to 10 mm. VASCULAR: There is thrombosis of the left internal jugular vein along nearly its entire length. At the level of the thyroid cartilage, there is an adjacent abscess that is continuous with the anterior aspect of the internal jugular vein (series 3, image 71) that measures 18 x 20 mm. There is marked swelling of the overlying sternocleidomastoid muscle with inflammatory induration the left neck subcutaneous tissues. LIMITED INTRACRANIAL: Normal. VISUALIZED ORBITS: Normal. MASTOIDS AND VISUALIZED PARANASAL SINUSES: No  fluid levels or advanced mucosal thickening. No mastoid effusion. SKELETON: No bony spinal canal stenosis. No lytic or blastic lesions. UPPER CHEST: Clear. OTHER: None. IMPRESSION: 1. 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 2. Left sternocleidomastoid myositis and left neck cellulitis with reactive left cervical lymphadenopathy. Critical Value/emergent results were called by telephone at the time of interpretation on 07/22/2019 at 7:53 pm to Dr. Samuel Jester , who verbally acknowledged these results. Electronically Signed   By: Deatra Robinson M.D.   On: 07/22/2019 19:56    Time Spent in minutes 35   Lelend Heinecke M.D on 07/24/2019 at 1:47 PM  Between 7am to 7pm - Pager - [phone]  After 7pm go to [email or web address] - password [facility]  [facility] -  Office  [phone]

## 2019-07-24 NOTE — Progress Notes (Signed)
Initial Nutrition Assessment  INTERVENTION:   -Ensure Enlive po BID, each supplement provides 350 kcal and 20 grams of protein -Multivitamin with minerals daily  NUTRITION DIAGNOSIS:   Inadequate oral intake related to poor appetite as evidenced by meal completion < 50%.  GOAL:   Patient will meet greater than or equal to 90% of their needs  MONITOR:   PO intake, Supplement acceptance, Labs, Weight trends, I & O's  REASON FOR ASSESSMENT:   Malnutrition Screening Tool    ASSESSMENT:   28 year old male with history of IV drug use, history of neck abscess for which he was hospitalized in March this year and treated with broad-spectrum antibiotic as inpatient and discharged on oral clindamycin, reported history of hep C, alcohol and tobacco use presented with left neck cellulitis and abscess with associated thrombosis of left internal jugular along nearly its entire length.  **RD working remotely**  Patient reports having poor appetite for at least a week PTA d/t pain from abscess. UDS+ for benzos. Pt with history of IV drug use. Pt has been consuming 15-45% of meals over the past day or so. Would recommend Ensure supplements and daily MVI.   Per weight records, pt weighed 153 lbs during an admission at [facility]. Current weight is 157 lbs. I/Os: +3.4L since admit.  Medications reviewed. Labs reviewed:  Low K  NUTRITION - FOCUSED PHYSICAL EXAM:  Unable to perform -working remotely.  Diet Order:   Diet Order            Diet regular Room service appropriate? Yes; Fluid consistency: Thin  Diet effective now              EDUCATION NEEDS:   No education needs have been identified at this time  Skin:  Skin Assessment: Reviewed RN Assessment  Last BM:  8/22  Height:   Ht Readings from Last 1 Encounters:  07/22/19 5\' 5"  (1.651 m)    Weight:   Wt Readings from Last 1 Encounters:  07/22/19 71.3 kg    Ideal Body Weight:  56.8 kg  BMI:  Body mass index is 26.16  kg/m.  Estimated Nutritional Needs:   Kcal:  2100-2300  Protein:  95-105g  Fluid:  2.1L/day  Clayton Bibles, MS, RD, LDN [place] Dietitian Pager: [phone] After Hours Pager: [phone]

## 2019-07-25 ENCOUNTER — Inpatient Hospital Stay (HOSPITAL_COMMUNITY): Payer: Medicaid Other | Admitting: Anesthesiology

## 2019-07-25 ENCOUNTER — Encounter (HOSPITAL_COMMUNITY): Payer: Self-pay | Admitting: Surgery

## 2019-07-25 ENCOUNTER — Encounter (HOSPITAL_COMMUNITY): Payer: Self-pay | Admitting: Anesthesiology

## 2019-07-25 ENCOUNTER — Encounter (HOSPITAL_COMMUNITY)
Admission: EM | Disposition: A | Discharge status: Other Acute Inpatient Hospital | Payer: Self-pay | Source: Home / Self Care | Attending: Internal Medicine

## 2019-07-25 ENCOUNTER — Encounter (HOSPITAL_COMMUNITY): Admission: RE | Disposition: A | Discharge status: Home / Self Care | Payer: Self-pay | Attending: Internal Medicine

## 2019-07-25 ENCOUNTER — Inpatient Hospital Stay (HOSPITAL_COMMUNITY)
Admission: RE | Admit: 2019-07-25 | Discharge: 2019-07-28 | Disposition: A | Discharge status: Home / Self Care | Payer: Medicaid Other | Attending: Internal Medicine | Admitting: Internal Medicine

## 2019-07-25 DIAGNOSIS — F1910 Other psychoactive substance abuse, uncomplicated: Secondary | ICD-10-CM | POA: Diagnosis present

## 2019-07-25 DIAGNOSIS — B1920 Unspecified viral hepatitis C without hepatic coma: Secondary | ICD-10-CM | POA: Diagnosis present

## 2019-07-25 DIAGNOSIS — L0211 Cutaneous abscess of neck: Secondary | ICD-10-CM | POA: Diagnosis present

## 2019-07-25 DIAGNOSIS — I82C12 Acute embolism and thrombosis of left internal jugular vein: Secondary | ICD-10-CM | POA: Diagnosis present

## 2019-07-25 DIAGNOSIS — Z720 Tobacco use: Secondary | ICD-10-CM | POA: Diagnosis present

## 2019-07-25 HISTORY — PX: INCISION AND DRAINAGE ABSCESS: SHX5864

## 2019-07-25 LAB — CBC
HCT: 29.8 % — ABNORMAL LOW (ref 39.0–52.0)
Hemoglobin: 9.7 g/dL — ABNORMAL LOW (ref 13.0–17.0)
MCH: 27.8 pg (ref 26.0–34.0)
MCHC: 32.6 g/dL (ref 30.0–36.0)
MCV: 85.4 fL (ref 80.0–100.0)
Platelets: 389 10*3/uL (ref 150–400)
RBC: 3.49 MIL/uL — ABNORMAL LOW (ref 4.22–5.81)
RDW: 11.9 % (ref 11.5–15.5)
WBC: 10.3 10*3/uL (ref 4.0–10.5)
nRBC: 0 % (ref 0.0–0.2)

## 2019-07-25 LAB — SURGICAL PCR SCREEN
MRSA, PCR: POSITIVE — AB
Staphylococcus aureus: POSITIVE — AB

## 2019-07-25 LAB — HEPARIN LEVEL (UNFRACTIONATED): Heparin Unfractionated: 0.49 IU/mL (ref 0.30–0.70)

## 2019-07-25 LAB — ANTITHROMBIN III: AntiThromb III Func: 75 % (ref 75–120)

## 2019-07-25 SURGERY — INCISION AND DRAINAGE, ABSCESS
Anesthesia: General | Site: Neck | Laterality: Left

## 2019-07-25 SURGERY — INCISION AND DRAINAGE, ABSCESS
Anesthesia: General | Laterality: Left

## 2019-07-25 MED ORDER — MORPHINE SULFATE (PF) 2 MG/ML IV SOLN
2.0000 mg | INTRAVENOUS | Status: DC | PRN
  Administered 2019-07-25: 2 mg via INTRAVENOUS
  Filled 2019-07-25: qty 1

## 2019-07-25 MED ORDER — VANCOMYCIN HCL IN DEXTROSE 1-5 GM/200ML-% IV SOLN: 1000.0000 mg | Freq: Three times a day (TID) | INTRAVENOUS | Status: DC

## 2019-07-25 MED ORDER — 0.9 % SODIUM CHLORIDE (POUR BTL) OPTIME
TOPICAL | Status: DC | PRN
  Administered 2019-07-25: 1000 mL

## 2019-07-25 MED ORDER — NICOTINE 21 MG/24HR TD PT24
21.0000 mg | MEDICATED_PATCH | Freq: Every day | TRANSDERMAL | Status: DC
  Administered 2019-07-26 – 2019-07-28 (×3): 21 mg via TRANSDERMAL
  Filled 2019-07-25 (×3): qty 1

## 2019-07-25 MED ORDER — HYDROMORPHONE HCL 1 MG/ML IJ SOLN
0.2500 mg | INTRAMUSCULAR | Status: DC | PRN
  Administered 2019-07-25 (×2): 0.5 mg via INTRAVENOUS

## 2019-07-25 MED ORDER — OXYCODONE HCL 5 MG PO TABS: 10.0000 mg | ORAL_TABLET | ORAL | Status: DC | PRN

## 2019-07-25 MED ORDER — MIDAZOLAM HCL 2 MG/2ML IJ SOLN: 0.5000 mg | Freq: Once | INTRAMUSCULAR | Status: DC | PRN

## 2019-07-25 MED ORDER — CHLORHEXIDINE GLUCONATE CLOTH 2 % EX PADS: 6.0000 | MEDICATED_PAD | Freq: Once | CUTANEOUS | Status: DC

## 2019-07-25 MED ORDER — LEVOFLOXACIN IN D5W 750 MG/150ML IV SOLN
750.0000 mg | INTRAVENOUS | Status: DC
  Administered 2019-07-25: 750 mg via INTRAVENOUS
  Filled 2019-07-25 (×3): qty 150

## 2019-07-25 MED ORDER — FENTANYL CITRATE (PF) 250 MCG/5ML IJ SOLN
INTRAMUSCULAR | Status: DC | PRN
  Administered 2019-07-25: 25 ug via INTRAVENOUS
  Administered 2019-07-25: 75 ug via INTRAVENOUS
  Administered 2019-07-25: 50 ug via INTRAVENOUS
  Administered 2019-07-25 (×2): 25 ug via INTRAVENOUS

## 2019-07-25 MED ORDER — ONDANSETRON HCL 4 MG/2ML IJ SOLN
INTRAMUSCULAR | Status: AC
  Filled 2019-07-25: qty 2

## 2019-07-25 MED ORDER — ONDANSETRON HCL 4 MG/2ML IJ SOLN: 4.0000 mg | Freq: Four times a day (QID) | INTRAMUSCULAR | Status: DC | PRN

## 2019-07-25 MED ORDER — LACTATED RINGERS IV SOLN
INTRAVENOUS | Status: DC | PRN
  Administered 2019-07-25: 16:00:00 via INTRAVENOUS

## 2019-07-25 MED ORDER — METRONIDAZOLE IN NACL 5-0.79 MG/ML-% IV SOLN
500.0000 mg | Freq: Three times a day (TID) | INTRAVENOUS | Status: DC
  Administered 2019-07-25 – 2019-07-26 (×3): 500 mg via INTRAVENOUS
  Filled 2019-07-25 (×3): qty 100

## 2019-07-25 MED ORDER — ONDANSETRON HCL 4 MG/2ML IJ SOLN
INTRAMUSCULAR | Status: DC | PRN
  Administered 2019-07-25: 4 mg via INTRAVENOUS

## 2019-07-25 MED ORDER — HYDROMORPHONE HCL 1 MG/ML IJ SOLN
INTRAMUSCULAR | Status: AC
  Filled 2019-07-25: qty 1

## 2019-07-25 MED ORDER — MORPHINE SULFATE (PF) 2 MG/ML IV SOLN: 2.0000 mg | INTRAVENOUS | Status: DC | PRN

## 2019-07-25 MED ORDER — DEXMEDETOMIDINE HCL 200 MCG/2ML IV SOLN
INTRAVENOUS | Status: DC | PRN
  Administered 2019-07-25 (×5): 8 ug via INTRAVENOUS

## 2019-07-25 MED ORDER — BUPRENORPHINE HCL-NALOXONE HCL 8-2 MG SL SUBL: 1.0000 | SUBLINGUAL_TABLET | Freq: Two times a day (BID) | SUBLINGUAL | Status: DC

## 2019-07-25 MED ORDER — MIDAZOLAM HCL 5 MG/5ML IJ SOLN
INTRAMUSCULAR | Status: DC | PRN
  Administered 2019-07-25: 2 mg via INTRAVENOUS

## 2019-07-25 MED ORDER — MEPERIDINE HCL 25 MG/ML IJ SOLN: 6.2500 mg | INTRAMUSCULAR | Status: DC | PRN

## 2019-07-25 MED ORDER — LIDOCAINE-EPINEPHRINE 1 %-1:100000 IJ SOLN
INTRAMUSCULAR | Status: AC
  Filled 2019-07-25: qty 1

## 2019-07-25 MED ORDER — BUPRENORPHINE HCL-NALOXONE HCL 8-2 MG SL SUBL
1.0000 | SUBLINGUAL_TABLET | Freq: Two times a day (BID) | SUBLINGUAL | Status: DC
  Administered 2019-07-25 – 2019-07-28 (×6): 1 via SUBLINGUAL
  Filled 2019-07-25 (×6): qty 1

## 2019-07-25 MED ORDER — VANCOMYCIN HCL 10 G IV SOLR
1250.0000 mg | Freq: Two times a day (BID) | INTRAVENOUS | Status: DC
  Administered 2019-07-25 – 2019-07-27 (×4): 1250 mg via INTRAVENOUS
  Filled 2019-07-25 (×6): qty 1250

## 2019-07-25 MED ORDER — NICOTINE POLACRILEX 2 MG MT GUM
2.0000 mg | CHEWING_GUM | OROMUCOSAL | Status: DC | PRN
  Filled 2019-07-25: qty 1

## 2019-07-25 MED ORDER — NICOTINE 21 MG/24HR TD PT24: 21.0000 mg | MEDICATED_PATCH | Freq: Every day | TRANSDERMAL | Status: DC

## 2019-07-25 MED ORDER — PROMETHAZINE HCL 25 MG/ML IJ SOLN: 6.2500 mg | INTRAMUSCULAR | Status: DC | PRN

## 2019-07-25 MED ORDER — ONDANSETRON HCL 4 MG PO TABS: 4.0000 mg | ORAL_TABLET | Freq: Four times a day (QID) | ORAL | Status: DC | PRN

## 2019-07-25 MED ORDER — MIDAZOLAM HCL 2 MG/2ML IJ SOLN
INTRAMUSCULAR | Status: AC
  Filled 2019-07-25: qty 2

## 2019-07-25 MED ORDER — HEPARIN (PORCINE) 25000 UT/250ML-% IV SOLN
2600.0000 [IU]/h | INTRAVENOUS | Status: DC
  Administered 2019-07-25 – 2019-07-26 (×3): 2800 [IU]/h via INTRAVENOUS
  Administered 2019-07-27: 12:00:00 2600 [IU]/h via INTRAVENOUS
  Filled 2019-07-25 (×8): qty 250

## 2019-07-25 MED ORDER — NICOTINE POLACRILEX 2 MG MT GUM: 2.0000 mg | CHEWING_GUM | OROMUCOSAL | Status: DC | PRN

## 2019-07-25 MED ORDER — LIDOCAINE 2% (20 MG/ML) 5 ML SYRINGE
INTRAMUSCULAR | Status: DC | PRN
  Administered 2019-07-25: 100 mg via INTRAVENOUS

## 2019-07-25 MED ORDER — PROPOFOL 10 MG/ML IV BOLUS
INTRAVENOUS | Status: DC | PRN
  Administered 2019-07-25: 200 mg via INTRAVENOUS

## 2019-07-25 MED ORDER — FENTANYL CITRATE (PF) 250 MCG/5ML IJ SOLN
INTRAMUSCULAR | Status: AC
  Filled 2019-07-25: qty 5

## 2019-07-25 MED ORDER — DEXAMETHASONE SODIUM PHOSPHATE 10 MG/ML IJ SOLN
INTRAMUSCULAR | Status: AC
  Filled 2019-07-25: qty 1

## 2019-07-25 MED ORDER — MORPHINE SULFATE (PF) 2 MG/ML IV SOLN
2.0000 mg | INTRAVENOUS | Status: DC | PRN
  Administered 2019-07-25 – 2019-07-26 (×3): 2 mg via INTRAVENOUS
  Filled 2019-07-25 (×3): qty 1

## 2019-07-25 MED ORDER — METRONIDAZOLE IN NACL 5-0.79 MG/ML-% IV SOLN: 500.0000 mg | Freq: Three times a day (TID) | INTRAVENOUS | Status: DC

## 2019-07-25 SURGICAL SUPPLY — 47 items
ATTRACTOMAT 16X20 MAGNETIC DRP (DRAPES) IMPLANT
BLADE SURG 15 STRL LF DISP TIS (BLADE) IMPLANT
BLADE SURG 15 STRL SS (BLADE)
BNDG CONFORM 3 STRL LF (GAUZE/BANDAGES/DRESSINGS) ×3 IMPLANT
BNDG GAUZE ELAST 4 BULKY (GAUZE/BANDAGES/DRESSINGS) IMPLANT
CANISTER SUCT 3000ML PPV (MISCELLANEOUS) ×3 IMPLANT
CLEANER TIP ELECTROSURG 2X2 (MISCELLANEOUS) ×3 IMPLANT
CONT SPEC 4OZ CLIKSEAL STRL BL (MISCELLANEOUS) ×3 IMPLANT
CORD BIPOLAR FORCEPS 12FT (ELECTRODE) ×3 IMPLANT
COVER SURGICAL LIGHT HANDLE (MISCELLANEOUS) ×3 IMPLANT
COVER WAND RF STERILE (DRAPES) ×3 IMPLANT
DRAIN HEMOVAC 7FR (DRAIN) IMPLANT
DRAIN PENROSE 1/2X12 LTX STRL (WOUND CARE) ×3 IMPLANT
DRAIN PENROSE 1/4X12 LTX STRL (WOUND CARE) IMPLANT
DRAPE HALF SHEET 40X57 (DRAPES) IMPLANT
DRSG TELFA 3X8 NADH (GAUZE/BANDAGES/DRESSINGS) ×3 IMPLANT
ELECT COATED BLADE 2.86 ST (ELECTRODE) ×3 IMPLANT
ELECT REM PT RETURN 9FT ADLT (ELECTROSURGICAL) ×3
ELECT REM PT RETURN 9FT PED (ELECTROSURGICAL)
ELECTRODE REM PT RETRN 9FT PED (ELECTROSURGICAL) IMPLANT
ELECTRODE REM PT RTRN 9FT ADLT (ELECTROSURGICAL) ×1 IMPLANT
EVACUATOR SILICONE 100CC (DRAIN) IMPLANT
GAUZE SPONGE 4X4 12PLY STRL (GAUZE/BANDAGES/DRESSINGS) ×3 IMPLANT
GLOVE BIOGEL M 7.0 STRL (GLOVE) ×6 IMPLANT
GOWN STRL REUS W/ TWL LRG LVL3 (GOWN DISPOSABLE) ×2 IMPLANT
GOWN STRL REUS W/TWL LRG LVL3 (GOWN DISPOSABLE) ×4
KIT BASIN OR (CUSTOM PROCEDURE TRAY) ×3 IMPLANT
KIT TURNOVER KIT B (KITS) ×3 IMPLANT
LOCATOR NERVE 3 VOLT (DISPOSABLE) IMPLANT
NEEDLE HYPO 25GX1X1/2 BEV (NEEDLE) IMPLANT
NS IRRIG 1000ML POUR BTL (IV SOLUTION) ×3 IMPLANT
PAD ARMBOARD 7.5X6 YLW CONV (MISCELLANEOUS) ×6 IMPLANT
PENCIL BUTTON HOLSTER BLD 10FT (ELECTRODE) ×3 IMPLANT
STAPLER VISISTAT 35W (STAPLE) ×3 IMPLANT
SUT ETHILON 3 0 PS 1 (SUTURE) ×3 IMPLANT
SUT ETHILON 5 0 PS 2 18 (SUTURE) IMPLANT
SUT SILK 2 0 PERMA HAND 18 BK (SUTURE) IMPLANT
SUT SILK 3 0 REEL (SUTURE) IMPLANT
SUT VIC AB 3-0 PS2 18 (SUTURE)
SUT VIC AB 3-0 PS2 18XBRD (SUTURE) IMPLANT
SUT VIC AB 4-0 P-3 18X BRD (SUTURE) IMPLANT
SUT VIC AB 4-0 P3 18 (SUTURE)
SWAB COLLECTION DEVICE MRSA (MISCELLANEOUS) IMPLANT
SWAB CULTURE ESWAB REG 1ML (MISCELLANEOUS) IMPLANT
TOWEL GREEN STERILE FF (TOWEL DISPOSABLE) ×3 IMPLANT
TRAY ENT MC OR (CUSTOM PROCEDURE TRAY) ×3 IMPLANT
WATER STERILE IRR 1000ML POUR (IV SOLUTION) ×3 IMPLANT

## 2019-07-25 NOTE — Op Note (Signed)
Operative Note: INCISION & DRAINAGE NECK ABSCESS  Patient: [place] record number: [id number]  Date:07/25/2019  Pre-operative Indications: Deep left neck abscess  Postoperative Indications: Same  Surgical Procedure: Incision and Drainage of Left Deep Neck abscess  Anesthesia: GET  Surgeon: Delsa Bern, M.D.  Assist: None  Complications: None  EBL: Less than 50 cc   Brief History: The patient is a 28 y.o. male with a history of intravenous drug use and previous soft tissue abscess in the neck, presented to was a long emergency department with neck swelling, pain and fever.  He was admitted to the hospital service and treated with multiple broad-spectrum antibiotics.  Serial CT scans of the neck showed enlarging abscess cavity involving the left inferior neck and supraclavicular region.  The abscess measured 5.6 x 3 cm with significant soft tissue inflammatory changes.  Given the patient's history and findings, I recommended incision and drainage of abscess under general anesthesia, risks and benefits were discussed in detail with the patient and their family. They understand and agree with our plan for surgery which is scheduled at [facility] on an urgent basis.  Surgical Procedure: The patient is brought to the operating room on 07/25/2019 and placed in supine position on the operating table. General endotracheal anesthesia was established without difficulty. When the patient was adequately anesthetized, surgical timeout was performed and correct identification of the patient and the surgical procedure. The patient was positioned and prepped and draped in sterile fashion.  Patient's preoperative imaging was reviewed.  A #15 scalpel was used to make an incision in the skin and underlying deep subcutaneous tissue.  This is carried through the soft tissue into the abscess cavity.  A significant amount of purulent material was expressed.  Cultures and sensitivity for  aerobic and anaerobic bacteria were obtained and sent to the laboratory.  The abscess cavity was then thoroughly irrigated with sterile saline solution.  A Penrose drain was inserted to the depth of the abscess cavity and sutured to the skin with interrupted 3-0 Ethilon suture.  The patient's wound was then dressed with Telfa gauze, 4 x 4 gauze and a 4 inch Kerlix wrap.  An orogastric tube was passed and stomach contents were aspirated. Patient was awakened from anesthetic and transferred from the operating room to the recovery room in stable condition. There were no complications and blood loss was minimal.   Delsa Bern, M.D. [facility] ENT 07/25/2019

## 2019-07-25 NOTE — Progress Notes (Signed)
ANTICOAGULATION CONSULT NOTE - Follow Up Consult  Pharmacy Consult for Heparin Indication: VTE Treatment  Allergies  Allergen Reactions  . Amoxicillin     Steven-Johnson's Syndrome  . Ceclor [Cefaclor] Other (See Comments)    Steven-Johnson's Syndrome  . Erythromycin     Steven-Johnson's Syndrome  . Penicillins     Steven-Johnson's Syndrome  . Septra [Sulfamethoxazole-Trimethoprim]     Steven-Johnson's Syndrome  . Tylenol [Acetaminophen]     Patient Measurements: Height: 5\' 5"  (165.1 cm) Weight: 155 lb 3.2 oz (70.4 kg) IBW/kg (Calculated) : 61.5 Heparin Dosing Weight:   Vital Signs: Temp: 98.1 F (36.7 C) (08/26 0450) Temp Source: Oral (08/26 0450) BP: 114/69 (08/26 0450) Pulse Rate: 82 (08/26 0450)  Labs: Recent Labs    07/22/19 1700 07/23/19 0607  07/24/19 0026 07/24/19 1050 07/25/19 0322  HGB 11.7* 11.1*  --   --  10.7* 9.7*  HCT 35.9* 33.9*  --   --  33.0* 29.8*  PLT 439* 401*  --   --  390 389  LABPROT 12.9  --   --   --   --   --   INR 1.0  --   --   --   --   --   HEPARINUNFRC  --  <0.10*   < > <0.10* <0.10* 0.49  CREATININE 0.62 0.61  --   --  0.75  --    < > = values in this interval not displayed.    Estimated Creatinine Clearance: 118.5 mL/min (by C-G formula based on SCr of 0.75 mg/dL).   Medications:  Infusions:  . sodium chloride 100 mL/hr at 07/24/19 2102  . heparin 2,800 Units/hr (07/25/19 0110)  . levofloxacin (LEVAQUIN) IV 750 mg (07/24/19 2105)  . metronidazole 500 mg (07/25/19 0351)  . vancomycin 1,000 mg (07/25/19 0112)    Assessment: Patient with heparin level at goal.  No new heparin issues per RN.  ATIII ordered but still resulted at this time.    Goal of Therapy:  Heparin level 0.3-0.7 units/ml Monitor platelets by anticoagulation protocol: Yes   Plan:  Continue heparin drip at current rate Recheck level at Christine, Farley Crowford 07/25/2019,6:00 AM

## 2019-07-25 NOTE — Progress Notes (Signed)
[place] for IV heparin Indication: VTE treatment  Allergies  Allergen Reactions  . Amoxicillin     Steven-Johnson's Syndrome  . Ceclor [Cefaclor] Other (See Comments)    Steven-Johnson's Syndrome  . Erythromycin     Steven-Johnson's Syndrome  . Penicillins     Steven-Johnson's Syndrome  . Septra [Sulfamethoxazole-Trimethoprim]     Steven-Johnson's Syndrome  . Tylenol [Acetaminophen]     Patient Measurements: Heparin Dosing Weight: 72.6  Vital Signs: Temp: 98 F (36.7 C) (08/26 1748) BP: 102/62 (08/26 1748) Pulse Rate: 90 (08/26 1748)  Labs: Recent Labs    07/23/19 0607  07/24/19 0026 07/24/19 1050 07/25/19 0322  HGB 11.1*  --   --  10.7* 9.7*  HCT 33.9*  --   --  33.0* 29.8*  PLT 401*  --   --  390 389  HEPARINUNFRC <0.10*   < > <0.10* <0.10* 0.49  CREATININE 0.61  --   --  0.75  --    < > = values in this interval not displayed.    Estimated Creatinine Clearance: 118.5 mL/min (by C-G formula based on SCr of 0.75 mg/dL).   Medications:  Scheduled:  . buprenorphine-naloxone  1 tablet Sublingual BID  . HYDROmorphone      . [START ON 07/26/2019] nicotine  21 mg Transdermal Daily   Infusions:  . levofloxacin (LEVAQUIN) IV    . metronidazole    . vancomycin      Assessment: 28 yo male presented with neck swelling, redness, warmth on IV heparin per pharmacy for VTE treatment per MD order. 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  Pt S/P I & D of neck abscess this afternoon at [facility]. Heparin was turned off this AM ~1030 prior to procedure.  Today, 07/24/2019:  CBC: Hgb lower; Plt stable WNL  HL finally therapeutic after multiple dose increases up to 2800 units/hr (~40 units/kg/hr)  Antithrombin III level low end of normal range, not consistent with deficiency  Heparin turned off this AM ~1030 for procedure at [facility] ~1630   Goal of Therapy:  Heparin level 0.3-0.7 units/ml Monitor  platelets by anticoagulation protocol: Yes   Plan:   Per Dr. Wilburn Cornelia (ENT surgeon), restart heparin infusion at 2200 this evening (8/26); plan to restart at same infusion rate just prior to surgery (2800 units/hr); check 6-hr heparin level, then daily  Would strongly consider switching to LMWH after procedure if possible given unusually high heparin infusion rates required to achieve therapeutic levels and associated increased risk of bleeding postprocedurally  Daily CBC  Monitor for signs/symptoms of bleeding  Gillermina Hu, PharmD, BCPS, [facility] Clinical Pharmacist 07/25/2019, 6:23 PM

## 2019-07-25 NOTE — Progress Notes (Signed)
[place] for IV heparin Indication: VTE treatment  Allergies  Allergen Reactions  . Amoxicillin     Steven-Johnson's Syndrome  . Ceclor [Cefaclor] Other (See Comments)    Steven-Johnson's Syndrome  . Erythromycin     Steven-Johnson's Syndrome  . Penicillins     Steven-Johnson's Syndrome  . Septra [Sulfamethoxazole-Trimethoprim]     Steven-Johnson's Syndrome  . Tylenol [Acetaminophen]     Patient Measurements:   Heparin Dosing Weight: 72.6  Vital Signs: Temp: 98.1 F (36.7 C) (08/26 0450) Temp Source: Oral (08/26 0450) BP: 114/69 (08/26 0450) Pulse Rate: 82 (08/26 0450)  Labs: Recent Labs    07/22/19 1700 07/23/19 0607  07/24/19 0026 07/24/19 1050 07/25/19 0322  HGB 11.7* 11.1*  --   --  10.7* 9.7*  HCT 35.9* 33.9*  --   --  33.0* 29.8*  PLT 439* 401*  --   --  390 389  LABPROT 12.9  --   --   --   --   --   INR 1.0  --   --   --   --   --   HEPARINUNFRC  --  <0.10*   < > <0.10* <0.10* 0.49  CREATININE 0.62 0.61  --   --  0.75  --    < > = values in this interval not displayed.    Estimated Creatinine Clearance: 118.5 mL/min (by C-G formula based on SCr of 0.75 mg/dL).   Medications:  Scheduled:  . buprenorphine-naloxone  1 tablet Sublingual BID  . [START ON 07/26/2019] nicotine  21 mg Transdermal Daily   Infusions:  . levofloxacin (LEVAQUIN) IV    . metronidazole    . vancomycin      Assessment: 28 yo male presented with neck swelling, redness, warmth to start IV heparin per pharmacy for VTE treatment per MD order. 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  Today, 07/24/2019:  CBC: Hgb lower; Plt stable WNL  HL finally therapeutic after multiple dose increases up to 2800 units/hr (~40 units/kg/hr)  Antithrombin III level low end of normal range, not consistent with deficiency  Heparin turned off this AM ~1030 for procedure at [facility] ~1630   Goal of Therapy:  Heparin level 0.3-0.7  units/ml Monitor platelets by anticoagulation protocol: Yes   Plan:   F/u ability to resume anticoagulation after ENT procedure (I&D of neck abscess) by Dr. Wilburn Cornelia  Would strongly consider switching to LMWH after procedure if possible given unusually high heparin infusion rates required to achieve therapeutic levels and associated increased risk of bleeding postprocedurally  Daily CBC  Reuel Boom, PharmD, BCPS [phone] 07/25/2019, 3:49 PM

## 2019-07-25 NOTE — Anesthesia Procedure Notes (Signed)
Procedure Name: LMA Insertion Date/Time: 07/25/2019 4:34 PM Performed by: Clearnce Sorrel, CRNA Pre-anesthesia Checklist: Patient identified, Emergency Drugs available, Suction available, Patient being monitored and Timeout performed Patient Re-evaluated:Patient Re-evaluated prior to induction Oxygen Delivery Method: Circle system utilized Preoxygenation: Pre-oxygenation with 100% oxygen Induction Type: IV induction LMA: LMA with gastric port inserted Number of attempts: 1 Placement Confirmation: positive ETCO2 and breath sounds checked- equal and bilateral Tube secured with: Tape Dental Injury: Teeth and Oropharynx as per pre-operative assessment

## 2019-07-25 NOTE — Progress Notes (Signed)
PROGRESS NOTE    [name]  ZOX:[id number] DOB: Feb 03, 1991 DOA: 07/22/2019 PCP: Patient, No Pcp Per   Brief Narrative:  28 year old male with history of IV drug use, history of neck abscess for which he was hospitalized in March this year and treated with broad-spectrum antibiotic as inpatient and discharged on oral clindamycin, reported history of hep C, alcohol and tobacco use presented with left neck cellulitis and abscess with associated thrombosis of left internal jugular along nearly its entire length. Patient placed on empiric IV antibiotics, IV heparin. ENT and ID consulted.  Assessment & Plan:   Principal Problem:   Neck abscess Active Problems:   Cellulitis   IV drug abuse (HCC)   Hepatitis C   Tobacco abuse   Internal jugular vein thrombosis, left (HCC)   Thrombocytosis (HCC)   Anemia   L IJ Thrombosis with Neck Abscess/Myositis CT from 8/25 with worsening L cervical abscess -> discussed with ENT, planning for surgery at [facility] today Appreciate ID recommendations - continue vanc/levaquin/flagyl Will need 3-6 months anticoagulation  IV heparin currently on hold with surgery today, will need to be resumed post op when ok with ENT Echo with normal systolic function, no valvular vegetation noted 8/25 cx pending.  No cx on admission unfortunately  Hx IVDU: denies recent IVDU.  Continue suboxone BID.  Hx Chronic Hepatitis C: follow hepatitis C viral load.  May need outpatient treatment.  Hypokalemia: continue to monitor  Tobacco Use: encourage cessation, nicotine patch  Left posterior basilar infiltrate: atelectasis vs infiltrate.  No c/o SOB, suspect atelectasis.  Follow  DVT prophylaxis: heparin gtt, on hold until post op Code Status: full Family Communication: none at bedside Disposition Plan: pending   Consultants:   ENT  ID  Procedures:   none  Antimicrobials: Anti-infectives (From admission, onward)   Start     Dose/Rate Route Frequency  Ordered Stop   07/24/19 0200  vancomycin (VANCOCIN) IVPB 1000 mg/200 mL premix  Status:  Discontinued     1,000 mg 200 mL/hr over 60 Minutes Intravenous Every 8 hours 07/23/19 1734 07/25/19 1449   07/23/19 2000  metroNIDAZOLE (FLAGYL) IVPB 500 mg  Status:  Discontinued     500 mg 100 mL/hr over 60 Minutes Intravenous Every 8 hours 07/23/19 1752 07/25/19 1449   07/23/19 1830  meropenem (MERREM) 1 g in sodium chloride 0.9 % 100 mL IVPB  Status:  Discontinued     1 g 200 mL/hr over 30 Minutes Intravenous Every 8 hours 07/23/19 1721 07/23/19 1749   07/23/19 1830  levofloxacin (LEVAQUIN) IVPB 750 mg  Status:  Discontinued     750 mg 100 mL/hr over 90 Minutes Intravenous Every 24 hours 07/23/19 1752 07/25/19 1449   07/23/19 1745  vancomycin (VANCOCIN) 1,500 mg in sodium chloride 0.9 % 500 mL IVPB     1,500 mg 250 mL/hr over 120 Minutes Intravenous STAT 07/23/19 1722 07/23/19 2007   07/23/19 0600  clindamycin (CLEOCIN) IVPB 900 mg  Status:  Discontinued     900 mg 100 mL/hr over 30 Minutes Intravenous Every 8 hours 07/22/19 2114 07/23/19 1707   07/22/19 2045  clindamycin (CLEOCIN) IVPB 900 mg     900 mg 100 mL/hr over 30 Minutes Intravenous  Once 07/22/19 2040 07/22/19 2153     Subjective: C/o neck pain.  Not improving.  Objective: Vitals:   07/24/19 1550 07/24/19 2050 07/24/19 2352 07/25/19 0450  BP: 134/71 121/73 106/69 114/69  Pulse: 86 (!) 104 86 82  Resp:  16 20 18 19   Temp: 98.1 F (36.7 C) 99.6 F (37.6 C) 98 F (36.7 C) 98.1 F (36.7 C)  TempSrc: Oral Oral Oral Oral  SpO2: 100% 97% 99% 100%  Weight:      Height:        Intake/Output Summary (Last 24 hours) at 07/25/2019 1454 Last data filed at 07/25/2019 1322 Gross per 24 hour  Intake 3749.7 ml  Output --  Net 3749.7 ml   Filed Weights   07/22/19 1544 07/22/19 2227 07/24/19 1320  Weight: 72.6 kg 71.3 kg 70.4 kg    Examination:  General exam: Appears calm and comfortable  Neck: swelling to L side of  neck Respiratory system: Clear to auscultation. Respiratory effort normal. Cardiovascular system: S1 & S2 heard, RRR.  Gastrointestinal system: Abdomen is nondistended, soft and nontender.  Central nervous system: Alert and oriented. No focal neurological deficits. Extremities: no LEE Skin: No rashes, lesions or ulcers Psychiatry: Judgement and insight appear normal. Mood & affect appropriate.     Data Reviewed: I have personally reviewed following labs and imaging studies  CBC: Recent Labs  Lab 07/22/19 1700 07/23/19 0607 07/24/19 1050 07/25/19 0322  WBC 10.4 9.4 11.0* 10.3  NEUTROABS 7.8*  --   --   --   HGB 11.7* 11.1* 10.7* 9.7*  HCT 35.9* 33.9* 33.0* 29.8*  MCV 84.3 83.9 85.9 85.4  PLT 439* 401* 390 389   Basic Metabolic Panel: Recent Labs  Lab 07/22/19 1700 07/23/19 0607 07/24/19 1050  NA 137 138  --   K 3.8 3.2*  --   CL 97* 100  --   CO2 28 25  --   GLUCOSE 112* 119*  --   BUN 7 7  --   CREATININE 0.62 0.61 0.75  CALCIUM 8.9 8.6*  --    GFR: Estimated Creatinine Clearance: 118.5 mL/min (by C-G formula based on SCr of 0.75 mg/dL). Liver Function Tests: Recent Labs  Lab 07/22/19 1700  AST 10*  ALT 12  ALKPHOS 74  BILITOT 0.5  PROT 8.1  ALBUMIN 3.6   No results for input(s): LIPASE, AMYLASE in the last 168 hours. No results for input(s): AMMONIA in the last 168 hours. Coagulation Profile: Recent Labs  Lab 07/22/19 1700  INR 1.0   Cardiac Enzymes: No results for input(s): CKTOTAL, CKMB, CKMBINDEX, TROPONINI in the last 168 hours. BNP (last 3 results) No results for input(s): PROBNP in the last 8760 hours. HbA1C: No results for input(s): HGBA1C in the last 72 hours. CBG: No results for input(s): GLUCAP in the last 168 hours. Lipid Profile: No results for input(s): CHOL, HDL, LDLCALC, TRIG, CHOLHDL, LDLDIRECT in the last 72 hours. Thyroid Function Tests: No results for input(s): TSH, T4TOTAL, FREET4, T3FREE, THYROIDAB in the last 72  hours. Anemia Panel: No results for input(s): VITAMINB12, FOLATE, FERRITIN, TIBC, IRON, RETICCTPCT in the last 72 hours. Sepsis Labs: No results for input(s): PROCALCITON, LATICACIDVEN in the last 168 hours.  Recent Results (from the past 240 hour(s))  SARS CORONAVIRUS 2 Nasal Swab Aptima Multi Swab     Status: None   Collection Time: 07/22/19  8:03 PM   Specimen: Aptima Multi Swab; Nasal Swab  Result Value Ref Range Status   SARS Coronavirus 2 NEGATIVE NEGATIVE Final    Comment: (NOTE) SARS-CoV-2 target nucleic acids are NOT DETECTED. The SARS-CoV-2 RNA is generally detectable in upper and lower respiratory specimens during the acute phase of infection. Negative results do not preclude SARS-CoV-2 infection, do not  rule out co-infections with other pathogens, and should not be used as the sole basis for treatment or other patient management decisions. Negative results must be combined with clinical observations, patient history, and epidemiological information. The expected result is Negative. Fact Sheet for Patients: HairSlick.no Fact Sheet for Healthcare Providers: quierodirigir.com This test is not yet approved or cleared by the [place] FDA and  has been authorized for detection and/or diagnosis of SARS-CoV-2 by FDA under an Emergency Use Authorization (EUA). This EUA will remain  in effect (meaning this test can be used) for the duration of the COVID-19 declaration under Section 56 4(b)(1) of the Act, 21 U.S.C. section 360bbb-3(b)(1), unless the authorization is terminated or revoked sooner. Performed at [facility], [address] [place], [place] [address]   Culture, blood (Routine X 2) w Reflex to ID Panel     Status: None (Preliminary result)   Collection Time: 07/24/19 12:26 AM   Specimen: BLOOD  Result Value Ref Range Status   Specimen Description   Final    BLOOD BLOOD RIGHT HAND Performed at  [facility], [address] [place], [place] [address]    Special Requests   Final    BOTTLES DRAWN AEROBIC ONLY Blood Culture adequate volume   Culture   Final    NO GROWTH 1 DAY Performed at [facility], [address], [place], [place] [address]    Report Status PENDING  Incomplete         Radiology Studies: Ct Chest W Contrast  Result Date: 07/24/2019 CLINICAL DATA:  Cellulitis of left chest. EXAM: CT CHEST WITH CONTRAST TECHNIQUE: Multidetector CT imaging of the chest was performed during intravenous contrast administration. CONTRAST:  75mL OMNIPAQUE IOHEXOL 300 MG/ML  SOLN COMPARISON:  CT scan of July 22, 2019. FINDINGS: Cardiovascular: There is again noted thrombosis of the left internal jugular vein. There is no evidence of thoracic aortic dissection or aneurysm. Normal cardiac size. No pericardial effusion. Mediastinum/Nodes: The esophagus and thyroid gland are unremarkable. There is Lael Wetherbee large area of abnormal soft tissue density seen in the left cervical and supraclavicular region which is displacing the thyroid gland and trachea to the right slightly. Fluid collection measuring 5.6 x 3.1 cm is noted within this area consistent with abscess which is significantly enlarged compared to prior exam. Some inflammatory changes are seen extending into the retrosternal space. 12 mm right paratracheal lymph node is noted which most likely is inflammatory in etiology. Lungs/Pleura: No pneumothorax or pleural effusion is noted. Right lung is clear. Left posterior basilar subsegmental atelectasis or inflammation is noted. Upper Abdomen: No acute abnormality. Musculoskeletal: No chest wall abnormality. No acute or significant osseous findings. IMPRESSION: Left cervical abscess noted on prior exam is significantly enlarged currently, measuring 5.6 x 3.1 cm. There remains Tashana Haberl large amount of inflammatory material as well which may be enlarged compared to prior exam. This causes  some displacement of the thyroid gland and trachea to the right. Some extension of inflammatory changes are seen extending into the superior portion of the retrosternal space. Right paratracheal lymph node is noted which most likely is inflammatory in etiology. Stable thrombosis of left internal jugular vein is noted. Left posterior basilar opacity is noted concerning for subsegmental atelectasis or possibly infiltrate. Electronically Signed   By: Lupita Raider M.D.   On: 07/24/2019 20:27        Scheduled Meds: Continuous Infusions:   LOS: 3 days    Time  spent: over 30 min    Lacretia Nicks, MD [facility] Pager AMION  If 7PM-7AM, please contact night-coverage [email or web address] Password [email or web address] 07/25/2019, 2:54 PM

## 2019-07-25 NOTE — Progress Notes (Signed)
Report was given to RN at [facility] Stay. Patient is being transported by [employer] .

## 2019-07-25 NOTE — Progress Notes (Signed)
   ENT Progress Note: HD #3  Procedure(s): INCISION AND DRAINAGE NECK ABSCESS   Subjective: Pain and swelling neck  Objective: Vital signs in last 24 hours: Temp:  [98 F (36.7 C)-99.6 F (37.6 C)] 98.1 F (36.7 C) (08/26 0450) Pulse Rate:  [82-104] 82 (08/26 0450) Resp:  [18-20] 19 (08/26 0450) BP: (106-121)/(69-73) 114/69 (08/26 0450) SpO2:  [97 %-100 %] 100 % (08/26 0450) Weight change:     Intake/Output from previous day: 08/25 0701 - 08/26 0700 In: 2993.6 [P.O.:1892; I.V.:601.6; IV Piggyback:500] Out: -  Intake/Output this shift: Total I/O In: 2175.1 [I.V.:1325.1; IV Piggyback:850] Out: -   Labs: Recent Labs    07/24/19 1050 07/25/19 0322  WBC 11.0* 10.3  HGB 10.7* 9.7*  HCT 33.0* 29.8*  PLT 390 389   Recent Labs    07/22/19 1700 07/23/19 0607  NA 137 138  K 3.8 3.2*  CL 97* 100  CO2 28 25  GLUCOSE 112* 119*  BUN 7 7  CALCIUM 8.9 8.6*    Studies/Results: Ct Chest W Contrast  Result Date: 07/24/2019 CLINICAL DATA:  Cellulitis of left chest. EXAM: CT CHEST WITH CONTRAST TECHNIQUE: Multidetector CT imaging of the chest was performed during intravenous contrast administration. CONTRAST:  105mL OMNIPAQUE IOHEXOL 300 MG/ML  SOLN COMPARISON:  CT scan of July 22, 2019. FINDINGS: Cardiovascular: There is again noted thrombosis of the left internal jugular vein. There is no evidence of thoracic aortic dissection or aneurysm. Normal cardiac size. No pericardial effusion. Mediastinum/Nodes: The esophagus and thyroid gland are unremarkable. There is a large area of abnormal soft tissue density seen in the left cervical and supraclavicular region which is displacing the thyroid gland and trachea to the right slightly. Fluid collection measuring 5.6 x 3.1 cm is noted within this area consistent with abscess which is significantly enlarged compared to prior exam. Some inflammatory changes are seen extending into the retrosternal space. 12 mm right paratracheal lymph  node is noted which most likely is inflammatory in etiology. Lungs/Pleura: No pneumothorax or pleural effusion is noted. Right lung is clear. Left posterior basilar subsegmental atelectasis or inflammation is noted. Upper Abdomen: No acute abnormality. Musculoskeletal: No chest wall abnormality. No acute or significant osseous findings. IMPRESSION: Left cervical abscess noted on prior exam is significantly enlarged currently, measuring 5.6 x 3.1 cm. There remains a large amount of inflammatory material as well which may be enlarged compared to prior exam. This causes some displacement of the thyroid gland and trachea to the right. Some extension of inflammatory changes are seen extending into the superior portion of the retrosternal space. Right paratracheal lymph node is noted which most likely is inflammatory in etiology. Stable thrombosis of left internal jugular vein is noted. Left posterior basilar opacity is noted concerning for subsegmental atelectasis or possibly infiltrate. Electronically Signed   By: Marijo Conception M.D.   On: 07/24/2019 20:27     PHYSICAL EXAM: Sig swelling and erythema left lower neck    Assessment/Plan: Patient with increasing swelling and pain involving the left lower neck.  CT scan from 07/24/2019 shows a 5.6 x 3 cm soft tissue phlegmon involving the left inferior lateral neck which has progressed over the last 2 days despite appropriate antibiotic therapy.  Given his history and findings I recommended incision and drainage under anesthesia with drainage and further antibiotics.    [name] 07/25/2019, 4:16 PM

## 2019-07-25 NOTE — Progress Notes (Addendum)
Pharmacy Antibiotic Note  [name] is a 28 y.o. male admitted on 07/25/2019 as a transfer from [facility] with neck abscess.  Pharmacy has been consulted for vancomycin and levofloxacin dosing (pt was receiving vancomycin, levofloxacin and metronidazole at [facility]). Pt scheduled for surgery today at [facility] for I and D of neck abscess.  Pt has PCN allergy (reaction: SJS) - ID following. 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."  Today, 07/25/19  WBC - WNL  Afebrile  Renal function stable (last Scr on 8/25)   Plan:  Vancomycin 1250 mg IV Q 12 hrs  (estimated vancomycin AUC on this regimen, using Scr of 0.8, is 480; goal vanc AUC is 400-550)  Levofloxacin 750 mg IV Q 24 hrs  Pt also receiving metronidazole 500 mg IV q8h per MD  Monitor renal function, WBC, temp, cx, clinical improvement  Check vancomycin levels once at steady state as indicated   Temp (24hrs), Avg:98.6 F (37 C), Min:98 F (36.7 C), Max:99.6 F (37.6 C)  Recent Labs  Lab 07/22/19 1700 07/23/19 0607 07/24/19 1050 07/25/19 0322  WBC 10.4 9.4 11.0* 10.3  CREATININE 0.62 0.61 0.75  --     Estimated Creatinine Clearance: 118.5 mL/min (by C-G formula based on SCr of 0.75 mg/dL).    Allergies  Allergen Reactions  . Amoxicillin     Steven-Johnson's Syndrome  . Ceclor [Cefaclor] Other (See Comments)    Steven-Johnson's Syndrome  . Erythromycin     Steven-Johnson's Syndrome  . Penicillins     Steven-Johnson's Syndrome  . Septra [Sulfamethoxazole-Trimethoprim]     Steven-Johnson's Syndrome  . Tylenol [Acetaminophen]     Antimicrobials this admission: vancomycin 8/24 >>  levofloxacin 8/24 >>  Metronidazole 8/24 >>  Dose adjustments this admission:  Microbiology results: 8/24 MRSA PCR: Sent 8/23 SARS-2: Negative 8/25 Bld cx X 2: pending  Thank you for allowing pharmacy to be a part of  this patient's care.  Gillermina Hu, PharmD, BCPS, [facility] Clinical Pharmacist 07/25/2019 4:47 PM

## 2019-07-25 NOTE — Transfer of Care (Signed)
Immediate Anesthesia Transfer of Care Note  Patient: [name]  Procedure(s) Performed: INCISION AND DRAINAGE NECK ABSCESS (Left Neck)  Patient Location: PACU  Anesthesia Type:General  Level of Consciousness: awake  Airway & Oxygen Therapy: Patient Spontanous Breathing and Patient connected to nasal cannula oxygen  Post-op Assessment: Report given to RN and Post -op Vital signs reviewed and stable  Post vital signs: Reviewed and stable  Last Vitals:  Vitals Value Taken Time  BP 97/63 07/25/19 1703  Temp    Pulse 88 07/25/19 1703  Resp 16 07/25/19 1703  SpO2 98 % 07/25/19 1703  Vitals shown include unvalidated device data.  Last Pain: There were no vitals filed for this visit.       Complications: No apparent anesthesia complications

## 2019-07-25 NOTE — Plan of Care (Signed)

## 2019-07-25 NOTE — Anesthesia Preprocedure Evaluation (Addendum)
Anesthesia Evaluation  Patient identified by MRN, date of birth, ID band Patient awake    Reviewed: Allergy & Precautions, NPO status , Patient's Chart, lab work & pertinent test results  Airway Mallampati: II  TM Distance: >3 FB Neck ROM: Full    Dental no notable dental hx. (+) Teeth Intact   Pulmonary neg pulmonary ROS, Current Smoker,    Pulmonary exam normal breath sounds clear to auscultation       Cardiovascular Exercise Tolerance: Good Normal cardiovascular examCardiac Defibrillator:    Rhythm:Regular Rate:Normal     Neuro/Psych negative neurological ROS  negative psych ROS   GI/Hepatic negative GI ROS, (+)     substance abuse  IV drug use, Hepatitis -, C  Endo/Other  negative endocrine ROS  Renal/GU      Musculoskeletal negative musculoskeletal ROS (+) narcotic dependent  Abdominal   Peds  Hematology  (+) Blood dyscrasia, anemia ,   Anesthesia Other Findings   Reproductive/Obstetrics                           Anesthesia Physical Anesthesia Plan  ASA: III  Anesthesia Plan: General   Post-op Pain Management:    Induction: Intravenous  PONV Risk Score and Plan: 4 or greater and Treatment may vary due to age or medical condition, Ondansetron and Dexamethasone  Airway Management Planned: LMA  Additional Equipment:   Intra-op Plan:   Post-operative Plan:   Informed Consent: I have reviewed the patients History and Physical, chart, labs and discussed the procedure including the risks, benefits and alternatives for the proposed anesthesia with the patient or authorized representative who has indicated his/her understanding and acceptance.     Dental advisory given  Plan Discussed with:   Anesthesia Plan Comments:        Anesthesia Quick Evaluation

## 2019-07-26 ENCOUNTER — Encounter (HOSPITAL_COMMUNITY): Payer: Self-pay | Admitting: Otolaryngology

## 2019-07-26 DIAGNOSIS — F1910 Other psychoactive substance abuse, uncomplicated: Secondary | ICD-10-CM

## 2019-07-26 DIAGNOSIS — B182 Chronic viral hepatitis C: Secondary | ICD-10-CM

## 2019-07-26 DIAGNOSIS — I82C12 Acute embolism and thrombosis of left internal jugular vein: Secondary | ICD-10-CM

## 2019-07-26 DIAGNOSIS — B9562 Methicillin resistant Staphylococcus aureus infection as the cause of diseases classified elsewhere: Secondary | ICD-10-CM

## 2019-07-26 DIAGNOSIS — Z888 Allergy status to other drugs, medicaments and biological substances status: Secondary | ICD-10-CM

## 2019-07-26 DIAGNOSIS — Z881 Allergy status to other antibiotic agents status: Secondary | ICD-10-CM

## 2019-07-26 DIAGNOSIS — L0211 Cutaneous abscess of neck: Secondary | ICD-10-CM

## 2019-07-26 DIAGNOSIS — Z880 Allergy status to penicillin: Secondary | ICD-10-CM

## 2019-07-26 LAB — COMPREHENSIVE METABOLIC PANEL
ALT: 10 U/L (ref 0–44)
AST: 12 U/L — ABNORMAL LOW (ref 15–41)
Albumin: 2.6 g/dL — ABNORMAL LOW (ref 3.5–5.0)
Alkaline Phosphatase: 58 U/L (ref 38–126)
Anion gap: 11 (ref 5–15)
BUN: 5 mg/dL — ABNORMAL LOW (ref 6–20)
CO2: 26 mmol/L (ref 22–32)
Calcium: 8.8 mg/dL — ABNORMAL LOW (ref 8.9–10.3)
Chloride: 102 mmol/L (ref 98–111)
Creatinine, Ser: 0.65 mg/dL (ref 0.61–1.24)
GFR calc Af Amer: >60 mL/min (ref >60)
GFR calc non Af Amer: >60 mL/min (ref >60)
Glucose, Bld: 107 mg/dL — ABNORMAL HIGH (ref 70–99)
Potassium: 3.6 mmol/L (ref 3.5–5.1)
Sodium: 139 mmol/L (ref 135–145)
Total Bilirubin: 0.8 mg/dL (ref 0.3–1.2)
Total Protein: 6.9 g/dL (ref 6.5–8.1)

## 2019-07-26 LAB — CBC
HCT: 32.9 % — ABNORMAL LOW (ref 39.0–52.0)
Hemoglobin: 10.8 g/dL — ABNORMAL LOW (ref 13.0–17.0)
MCH: 27.3 pg (ref 26.0–34.0)
MCHC: 32.8 g/dL (ref 30.0–36.0)
MCV: 83.3 fL (ref 80.0–100.0)
Platelets: 478 10*3/uL — ABNORMAL HIGH (ref 150–400)
RBC: 3.95 MIL/uL — ABNORMAL LOW (ref 4.22–5.81)
RDW: 11.8 % (ref 11.5–15.5)
WBC: 7.7 10*3/uL (ref 4.0–10.5)
nRBC: 0 % (ref 0.0–0.2)

## 2019-07-26 LAB — HCV RNA QUANT: HCV Quantitative: NOT DETECTED IU/mL (ref >50)

## 2019-07-26 LAB — HEPARIN LEVEL (UNFRACTIONATED): Heparin Unfractionated: 0.51 IU/mL (ref 0.30–0.70)

## 2019-07-26 NOTE — Discharge Summary (Signed)
Error. Pt transferred to [facility] on 8/26.  See progress note.

## 2019-07-26 NOTE — TOC Initial Note (Signed)
Transition of Care [facility]) - Initial/Assessment Note    Patient Details  Name: [name] MRN: [id number] Date of Birth: 14-May-1991  Transition of Care [facility]) CM/SW Contact:    Marilu Favre, RN Phone Number: 07/26/2019, 12:27 PM  Clinical Narrative:                  Confirmed face sheet information. Patient does have a PCP, however patient unsure of PCP name, believes PCP is with [facility].  Patient lives with his grandmother and 90 year old son. Patient also has 15 month old daughter.  Denies recent IVDU. Will continue to follow. Expected Discharge Plan: Home/Self Care Barriers to Discharge: Continued Medical Work up   Patient Goals and CMS Choice Patient states their goals for this hospitalization and ongoing recovery are:: to go home CMS Medicare.gov Compare Post Acute Care list provided to:: Patient Choice offered to / list presented to : NA  Expected Discharge Plan and Services Expected Discharge Plan: Home/Self Care       Living arrangements for the past 2 months: Single Family Home                 DME Arranged: N/A         HH Arranged: NA          Prior Living Arrangements/Services Living arrangements for the past 2 months: Single Family Home Lives with:: Parents(grandmother)   Do you feel safe going back to the place where you live?: Yes               Activities of Daily Living      Permission Sought/Granted   Permission granted to share information with : Yes, Verbal Permission Granted              Emotional Assessment              Admission diagnosis:  Neck Abscess Patient Active Problem List   Diagnosis Date Noted  . 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 Thrombocytosis ([place]) 07/22/2019  . Anemia 07/22/2019  . Neck abscess 02/07/2019  . Hepatitis C 10/17/2014  . Acute foreign body of left upper arm 10/17/2014  . Tobacco abuse 10/17/2014  . Cellulitis 10/16/2014  . Cellulitis of forearm,  left 10/16/2014  . IV drug abuse ([place]) 10/16/2014   PCP:  Patient, No Pcp Per Pharmacy:   [place], [place] [address] Phone: [phone] Fax: [place], [place] - [address] [place] [address] Phone: [phone] Fax: [phone]     Social Determinants of Health (SDOH) Interventions    Readmission Risk Interventions No flowsheet data found.

## 2019-07-26 NOTE — Progress Notes (Signed)
[facility] for IV heparin Indication: VTE treatment  Allergies  Allergen Reactions  . Amoxicillin     Steven-Johnson's Syndrome  . Ceclor [Cefaclor] Other (See Comments)    Steven-Johnson's Syndrome  . Erythromycin     Steven-Johnson's Syndrome  . Penicillins     Steven-Johnson's Syndrome  . Septra [Sulfamethoxazole-Trimethoprim]     Steven-Johnson's Syndrome  . Tylenol [Acetaminophen]     Patient Measurements: Heparin Dosing Weight: 72.6  Vital Signs: Temp: 98.3 F (36.8 C) (08/27 0211) Temp Source: Oral (08/27 0211) BP: 111/67 (08/27 0211) Pulse Rate: 82 (08/27 0211)  Labs: Recent Labs    07/23/19 0607  07/24/19 1050 07/25/19 0322 07/26/19 0327  HGB 11.1*  --  10.7* 9.7* 10.8*  HCT 33.9*  --  33.0* 29.8* 32.9*  PLT 401*  --  390 389 478*  HEPARINUNFRC <0.10*   < > <0.10* 0.49 0.51  CREATININE 0.61  --  0.75  --  0.65   < > = values in this interval not displayed.    Estimated Creatinine Clearance: 118.5 mL/min (by C-G formula based on SCr of 0.65 mg/dL).   Medications:  Scheduled:  . buprenorphine-naloxone  1 tablet Sublingual BID  . HYDROmorphone      . nicotine  21 mg Transdermal Daily   Infusions:  . heparin 2,800 Units/hr (07/26/19 0400)  . levofloxacin (LEVAQUIN) IV Stopped (07/25/19 2327)  . metronidazole Stopped (07/25/19 2059)  . vancomycin 1,250 mg (07/25/19 1929)    Assessment: 28 yo male presented with neck swelling, redness, warmth on IV heparin per pharmacy for VTE treatment per MD order. 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  Pt S/P I & D of neck abscess this afternoon at [facility] [place]. Heparin was turned off this AM ~1030 prior to procedure.  8/27 AM update:  Heparin level therapeutic, CBC stable  Goal of Therapy:  Heparin level 0.3-0.7 units/ml Monitor platelets by anticoagulation protocol: Yes   Plan:  Cont heparin at 2800 units/hr Daily CBC/HL Monitor for  bleeding  Narda Bonds, PharmD, BCPS Clinical Pharmacist Phone: [phone]

## 2019-07-26 NOTE — Progress Notes (Signed)
PROGRESS NOTE    [name]  [id number] DOB: Dec 31, 1990 DOA: 07/25/2019 PCP: Patient, No Pcp Per   Brief Narrative:  28 year old male with history of IV drug use, history of neck abscess for which he was hospitalized in March this year and treated with broad-spectrum antibiotic as inpatient and discharged on oral clindamycin, reported history of hep C, alcohol and tobacco use presented with left neck cellulitis and abscess with associated thrombosis of left internal jugular along nearly its entire length. Patient placed on empiric IV antibiotics, IV heparin. ENT and ID consulted.  Assessment & Plan:   Active Problems:   IV drug abuse ([place])   Hepatitis C   Tobacco abuse   Neck abscess   Internal jugular vein thrombosis, left ([facility])  L IJ Thrombosis with Neck Abscess/Myositis CT from 8/25 with worsening L cervical abscess  S/p I/D on 26th with ENT Appreciate ID recommendations - continue vanc/levaquin/flagyl Will need 3-6 months anticoagulation  IV heparin resumed post op Echo with normal systolic function, no valvular vegetation noted 8/25 cx pending.  No cx on admission unfortunately  Hx IVDU: denies recent IVDU.  Continue suboxone BID.  Hx Chronic Hepatitis C: follow hepatitis C viral load.  May need outpatient treatment.  Hypokalemia: continue to monitor  Tobacco Use: encourage cessation, nicotine patch  Left posterior basilar infiltrate: atelectasis vs infiltrate.  No c/o SOB, suspect dependent atelectasis.  Follow clinically  DVT prophylaxis: heparin gtt Code Status: full Family Communication: none at bedside Disposition Plan: pending clinical improvement and ongoing need for IV abx   Consultants:   ENT  ID  Procedures:   I/D 8/26  Antimicrobials: Anti-infectives (From admission, onward)   Start     Dose/Rate Route Frequency Ordered Stop   07/25/19 2100  levofloxacin (LEVAQUIN) IVPB 750 mg     750 mg 100 mL/hr over 90 Minutes Intravenous Every 24  hours 07/25/19 1524     07/25/19 2000  metroNIDAZOLE (FLAGYL) IVPB 500 mg     500 mg 100 mL/hr over 60 Minutes Intravenous Every 8 hours 07/25/19 1524     07/25/19 1830  vancomycin (VANCOCIN) 1,250 mg in sodium chloride 0.9 % 250 mL IVPB     1,250 mg 166.7 mL/hr over 90 Minutes Intravenous Every 12 hours 07/25/19 1645     07/25/19 1800  vancomycin (VANCOCIN) IVPB 1000 mg/200 mL premix  Status:  Discontinued     1,000 mg 200 mL/hr over 60 Minutes Intravenous Every 8 hours 07/25/19 1524 07/25/19 1645     Subjective: No acute issues or events overnight, tolerating p.o. well, pain moderately well controlled currently declines chest pain, shortness of breath, nausea, vomiting, diarrhea, constipation, headache, fevers, chills.  Objective: Vitals:   07/25/19 1850 07/25/19 1905 07/25/19 1932 07/26/19 0211  BP: 100/68 103/77 107/72 111/67  Pulse: 91 (!) 101 89 82  Resp: 18 17 16 18   Temp:   99 F (37.2 C) 98.3 F (36.8 C)  TempSrc:    Oral  SpO2:  97% 97% 100%    Intake/Output Summary (Last 24 hours) at 07/26/2019 1149 Last data filed at 07/26/2019 1019 Gross per 24 hour  Intake 4283.62 ml  Output 5 ml  Net 4278.62 ml   There were no vitals filed for this visit.  Examination:  General exam: Appears calm and comfortable  Neck: Bandage clean dry intact Respiratory system: Clear to auscultation. Respiratory effort normal. Cardiovascular system: S1 & S2 heard, RRR.  Gastrointestinal system: Abdomen is nondistended, soft and nontender.  Central nervous system: Alert and oriented. No focal neurological deficits. Extremities: no LEE Psychiatry: Judgement and insight appear normal. Mood & affect appropriate.   Data Reviewed: I have personally reviewed following labs and imaging studies  CBC: Recent Labs  Lab 07/22/19 1700 07/23/19 0607 07/24/19 1050 07/25/19 0322 07/26/19 0327  WBC 10.4 9.4 11.0* 10.3 7.7  NEUTROABS 7.8*  --   --   --   --   HGB 11.7* 11.1* 10.7* 9.7* 10.8*    HCT 35.9* 33.9* 33.0* 29.8* 32.9*  MCV 84.3 83.9 85.9 85.4 83.3  PLT 439* 401* 390 389 123456*   Basic Metabolic Panel: Recent Labs  Lab 07/22/19 1700 07/23/19 0607 07/24/19 1050 07/26/19 0327  NA 137 138  --  139  K 3.8 3.2*  --  3.6  CL 97* 100  --  102  CO2 28 25  --  26  GLUCOSE 112* 119*  --  107*  BUN 7 7  --  5*  CREATININE 0.62 0.61 0.75 0.65  CALCIUM 8.9 8.6*  --  8.8*   GFR: Estimated Creatinine Clearance: 118.5 mL/min (by C-G formula based on SCr of 0.65 mg/dL).   Liver Function Tests: Recent Labs  Lab 07/22/19 1700 07/26/19 0327  AST 10* 12*  ALT 12 10  ALKPHOS 74 58  BILITOT 0.5 0.8  PROT 8.1 6.9  ALBUMIN 3.6 2.6*   No results for input(s): LIPASE, AMYLASE in the last 168 hours. No results for input(s): AMMONIA in the last 168 hours. Coagulation Profile: Recent Labs  Lab 07/22/19 1700  INR 1.0    Recent Results (from the past 240 hour(s))  SARS CORONAVIRUS 2 Nasal Swab Aptima Multi Swab     Status: None   Collection Time: 07/22/19  8:03 PM   Specimen: Aptima Multi Swab; Nasal Swab  Result Value Ref Range Status   SARS Coronavirus 2 NEGATIVE NEGATIVE Final    Comment: (NOTE) SARS-CoV-2 target nucleic acids are NOT DETECTED. The SARS-CoV-2 RNA is generally detectable in upper and lower respiratory specimens during the acute phase of infection. Negative results do not preclude SARS-CoV-2 infection, do not rule out co-infections with other pathogens, and should not be used as the sole basis for treatment or other patient management decisions. Negative results must be combined with clinical observations, patient history, and epidemiological information. The expected result is Negative. Fact Sheet for Patients: SugarRoll.be Fact Sheet for Healthcare Providers: https://www.woods-mathews.com/ This test is not yet approved or cleared by the [place] FDA and  has been authorized for detection and/or  diagnosis of SARS-CoV-2 by FDA under an Emergency Use Authorization (EUA). This EUA will remain  in effect (meaning this test can be used) for the duration of the COVID-19 declaration under Section 56 4(b)(1) of the Act, 21 U.S.C. section 360bbb-3(b)(1), unless the authorization is terminated or revoked sooner. Performed at [facility], [place] [address], [place], [place] [address]   Culture, blood (Routine X 2) w Reflex to ID Panel     Status: None (Preliminary result)   Collection Time: 07/24/19 12:26 AM   Specimen: BLOOD  Result Value Ref Range Status   Specimen Description   Final    BLOOD BLOOD RIGHT HAND Performed at [place] [address], [place], [place] [address]    Special Requests   Final    BOTTLES DRAWN AEROBIC ONLY Blood Culture adequate volume   Culture   Final    NO GROWTH 2 DAYS Performed at [facility], [place] [address] [place], [place] [address]    Report Status PENDING  Incomplete  Surgical pcr screen     Status: Abnormal   Collection Time: 07/25/19  2:41 PM   Specimen: Nasal Mucosa; Nasal Swab  Result Value Ref Range Status   MRSA, PCR POSITIVE (A) NEGATIVE Final    Comment: RESULT CALLED TO, READ BACK BY AND VERIFIED WITH: BAUMGARDNER,C. RN @1839  ON 08.26.2020 BY COHEN,K    Staphylococcus aureus POSITIVE (A) NEGATIVE Final    Comment: (NOTE) The Xpert SA Assay (FDA approved for NASAL specimens in patients 28 years of age and older), is one component of a comprehensive surveillance program. It is not intended to diagnose infection nor to guide or monitor treatment. Performed at [facility], [place] [address] [place], [place] [address]   Aerobic/Anaerobic Culture (surgical/deep wound)     Status: None (Preliminary result)   Collection Time: 07/25/19  4:44 PM   Specimen: Abscess  Result Value Ref Range Status   Specimen Description ABSCESS  Final   Special Requests SAMPLE A LEFT NECK  Final   Gram  Stain   Final    FEW WBC PRESENT, PREDOMINANTLY PMN RARE GRAM POSITIVE COCCI    Culture   Final    CULTURE REINCUBATED FOR BETTER GROWTH Performed at [facility], [place] [address], [place], [place] [address]    Report Status PENDING  Incomplete    Radiology Studies: Ct Chest W Contrast  Result Date: 07/24/2019 CLINICAL DATA:  Cellulitis of left chest. EXAM: CT CHEST WITH CONTRAST TECHNIQUE: Multidetector CT imaging of the chest was performed during intravenous contrast administration. CONTRAST:  25mL OMNIPAQUE IOHEXOL 300 MG/ML  SOLN COMPARISON:  CT scan of July 22, 2019. FINDINGS: Cardiovascular: There is again noted thrombosis of the left internal jugular vein. There is no evidence of thoracic aortic dissection or aneurysm. Normal cardiac size. No pericardial effusion. Mediastinum/Nodes: The esophagus and thyroid gland are unremarkable. There is a large area of abnormal soft tissue density seen in the left cervical and supraclavicular region which is displacing the thyroid gland and trachea to the right slightly. Fluid collection measuring 5.6 x 3.1 cm is noted within this area consistent with abscess which is significantly enlarged compared to prior exam. Some inflammatory changes are seen extending into the retrosternal space. 12 mm right paratracheal lymph node is noted which most likely is inflammatory in etiology. Lungs/Pleura: No pneumothorax or pleural effusion is noted. Right lung is clear. Left posterior basilar subsegmental atelectasis or inflammation is noted. Upper Abdomen: No acute abnormality. Musculoskeletal: No chest wall abnormality. No acute or significant osseous findings. IMPRESSION: Left cervical abscess noted on prior exam is significantly enlarged currently, measuring 5.6 x 3.1 cm. There remains a large amount of inflammatory material as well which may be enlarged compared to prior exam. This causes some displacement of the thyroid gland and trachea to the right. Some  extension of inflammatory changes are seen extending into the superior portion of the retrosternal space. Right paratracheal lymph node is noted which most likely is inflammatory in etiology. Stable thrombosis of left internal jugular vein is noted. Left posterior basilar opacity is noted concerning for subsegmental atelectasis or possibly infiltrate. Electronically Signed   By: Marijo Conception M.D.   On: 07/24/2019 20:27     LOS: 1 day   Time spent: over 30 min  Little Ishikawa, MD [facility] Pager AMION  If 7PM-7AM, please contact night-coverage [email or web address] Password  [email or web address] 07/26/2019, 11:49 AM

## 2019-07-26 NOTE — Progress Notes (Signed)
Patient ID: [name], male   DOB: March 17, 1991, 28 y.o.   MRN: [id number]         [facility]  Date of Admission:  07/25/2019           Day 4 vancomycin        Day 4 levofloxacin        Day 4 metronidazole ASSESSMENT: Infection with an abscess and left internal jugular vein thrombosis (Lemierre's) CT scan of his chest does not show any evidence of septic pulmonary emboli.  Abscess Gram stain shows gram-positive cocci.  His MRSA PCR is positive.  History of severe allergic reaction to penicillins.  I will continue current antibiotic for now.  PLAN: 1. Continue current antibiotics pending cultures 2. Hepatitis C viral load  Active Problems:   IV drug abuse (HCC)   Hepatitis C   Tobacco abuse   Neck abscess   Internal jugular vein thrombosis, left (HCC)   Scheduled Meds: . buprenorphine-naloxone  1 tablet Sublingual BID  . nicotine  21 mg Transdermal Daily   Continuous Infusions: . heparin 2,800 Units/hr (07/26/19 0810)  . levofloxacin (LEVAQUIN) IV Stopped (07/25/19 2327)  . metronidazole 500 mg (07/26/19 0458)  . vancomycin 1,250 mg (07/26/19 0625)   PRN Meds:.morphine injection, nicotine polacrilex, ondansetron **OR** ondansetron (ZOFRAN) IV, oxyCODONE   SUBJECTIVE: He is feeling better today after I&D of his left abscess.  He denies any trauma to that area or recent injecting drug use.  Review of Systems: Review of Systems  Constitutional: Negative for chills, diaphoresis and fever.  HENT: Negative for sore throat.        No painful or loose teeth.  Gastrointestinal: Negative for abdominal pain, diarrhea, nausea and vomiting.    Allergies  Allergen Reactions  . Amoxicillin     Steven-Johnson's Syndrome  . Ceclor [Cefaclor] Other (See Comments)    Steven-Johnson's Syndrome  . Erythromycin     Steven-Johnson's Syndrome  . Penicillins     Steven-Johnson's Syndrome  . Septra [Sulfamethoxazole-Trimethoprim]     Steven-Johnson's  Syndrome  . Tylenol [Acetaminophen]     OBJECTIVE: Vitals:   07/25/19 1850 07/25/19 1905 07/25/19 1932 07/26/19 0211  BP: 100/68 103/77 107/72 111/67  Pulse: 91 (!) 101 89 82  Resp: 18 17 16 18   Temp:   99 F (37.2 C) 98.3 F (36.8 C)  TempSrc:    Oral  SpO2:  97% 97% 100%   There is no height or weight on file to calculate BMI.  Physical Exam Constitutional:      Comments: He is alert and pleasant resting quietly in bed.  Neck:     Comments: He has a clean dry gauze dressing over his left neck incision. Cardiovascular:     Rate and Rhythm: Normal rate and regular rhythm.     Heart sounds: No murmur.  Pulmonary:     Effort: Pulmonary effort is normal.     Breath sounds: Normal breath sounds.  Psychiatric:        Mood and Affect: Mood normal.     Lab Results Lab Results  Component Value Date   WBC 7.7 07/26/2019   HGB 10.8 (L) 07/26/2019   HCT 32.9 (L) 07/26/2019   MCV 83.3 07/26/2019   PLT 478 (H) 07/26/2019    Lab Results  Component Value Date   CREATININE 0.65 07/26/2019   BUN 5 (L) 07/26/2019   NA 139 07/26/2019   K 3.6 07/26/2019   CL 102 07/26/2019  CO2 26 07/26/2019    Lab Results  Component Value Date   ALT 10 07/26/2019   AST 12 (L) 07/26/2019   ALKPHOS 58 07/26/2019   BILITOT 0.8 07/26/2019     Microbiology: Recent Results (from the past 240 hour(s))  SARS CORONAVIRUS 2 Nasal Swab Aptima Multi Swab     Status: None   Collection Time: 07/22/19  8:03 PM   Specimen: Aptima Multi Swab; Nasal Swab  Result Value Ref Range Status   SARS Coronavirus 2 NEGATIVE NEGATIVE Final    Comment: (NOTE) SARS-CoV-2 target nucleic acids are NOT DETECTED. The SARS-CoV-2 RNA is generally detectable in upper and lower respiratory specimens during the acute phase of infection. Negative results do not preclude SARS-CoV-2 infection, do not rule out co-infections with other pathogens, and should not be used as the sole basis for treatment or other patient  management decisions. Negative results must be combined with clinical observations, patient history, and epidemiological information. The expected result is Negative. Fact Sheet for Patients: SugarRoll.be Fact Sheet for Healthcare Providers: https://www.woods-mathews.com/ This test is not yet approved or cleared by the [place] FDA and  has been authorized for detection and/or diagnosis of SARS-CoV-2 by FDA under an Emergency Use Authorization (EUA). This EUA will remain  in effect (meaning this test can be used) for the duration of the COVID-19 declaration under Section 56 4(b)(1) of the Act, 21 U.S.C. section 360bbb-3(b)(1), unless the authorization is terminated or revoked sooner. Performed at [facility], [place] [address] [place], [place] [address]   Culture, blood (Routine X 2) w Reflex to ID Panel     Status: None (Preliminary result)   Collection Time: 07/24/19 12:26 AM   Specimen: BLOOD  Result Value Ref Range Status   Specimen Description   Final    BLOOD BLOOD RIGHT HAND Performed at [facility] [address] [place], [place] [address]    Special Requests   Final    BOTTLES DRAWN AEROBIC ONLY Blood Culture adequate volume   Culture   Final    NO GROWTH 1 DAY Performed at [facility], [place] [address] [place], [place] [address]    Report Status PENDING  Incomplete  Surgical pcr screen     Status: Abnormal   Collection Time: 07/25/19  2:41 PM   Specimen: Nasal Mucosa; Nasal Swab  Result Value Ref Range Status   MRSA, PCR POSITIVE (A) NEGATIVE Final    Comment: RESULT CALLED TO, READ BACK BY AND VERIFIED WITH: BAUMGARDNER,C. RN @1839  ON 08.26.2020 BY COHEN,K    Staphylococcus aureus POSITIVE (A) NEGATIVE Final    Comment: (NOTE) The Xpert SA Assay (FDA approved for NASAL specimens in patients 51 years of age and older), is one component of a comprehensive surveillance program. It is  not intended to diagnose infection nor to guide or monitor treatment. Performed at [facility], [place] [address] [place], [place] [address]   Aerobic/Anaerobic Culture (surgical/deep wound)     Status: None (Preliminary result)   Collection Time: 07/25/19  4:44 PM   Specimen: Abscess  Result Value Ref Range Status   Specimen Description ABSCESS  Final   Special Requests SAMPLE A LEFT NECK  Final   Gram Stain   Final    FEW WBC PRESENT, PREDOMINANTLY PMN RARE GRAM POSITIVE COCCI Performed at [facility], [place] [address] [place],  [address]    Culture PENDING  Incomplete   Report Status PENDING  Incomplete    Michel Bickers, MD [facility] [phone] pager   [phone] cell 07/26/2019, 9:55 AM

## 2019-07-27 ENCOUNTER — Other Ambulatory Visit: Payer: Self-pay

## 2019-07-27 ENCOUNTER — Encounter (HOSPITAL_COMMUNITY): Payer: Self-pay | Admitting: General Practice

## 2019-07-27 DIAGNOSIS — B9561 Methicillin susceptible Staphylococcus aureus infection as the cause of diseases classified elsewhere: Secondary | ICD-10-CM

## 2019-07-27 DIAGNOSIS — Z8614 Personal history of Methicillin resistant Staphylococcus aureus infection: Secondary | ICD-10-CM

## 2019-07-27 LAB — BASIC METABOLIC PANEL
Anion gap: 9 (ref 5–15)
BUN: 5 mg/dL — ABNORMAL LOW (ref 6–20)
CO2: 27 mmol/L (ref 22–32)
Calcium: 8.8 mg/dL — ABNORMAL LOW (ref 8.9–10.3)
Chloride: 106 mmol/L (ref 98–111)
Creatinine, Ser: 0.67 mg/dL (ref 0.61–1.24)
GFR calc Af Amer: >60 mL/min (ref >60)
GFR calc non Af Amer: >60 mL/min (ref >60)
Glucose, Bld: 105 mg/dL — ABNORMAL HIGH (ref 70–99)
Potassium: 3.8 mmol/L (ref 3.5–5.1)
Sodium: 142 mmol/L (ref 135–145)

## 2019-07-27 LAB — CBC
HCT: 33.1 % — ABNORMAL LOW (ref 39.0–52.0)
Hemoglobin: 10.7 g/dL — ABNORMAL LOW (ref 13.0–17.0)
MCH: 27.5 pg (ref 26.0–34.0)
MCHC: 32.3 g/dL (ref 30.0–36.0)
MCV: 85.1 fL (ref 80.0–100.0)
Platelets: 514 10*3/uL — ABNORMAL HIGH (ref 150–400)
RBC: 3.89 MIL/uL — ABNORMAL LOW (ref 4.22–5.81)
RDW: 11.9 % (ref 11.5–15.5)
WBC: 5.1 10*3/uL (ref 4.0–10.5)
nRBC: 0 % (ref 0.0–0.2)

## 2019-07-27 LAB — VANCOMYCIN, PEAK: Vancomycin Pk: 85 ug/mL (ref 30–40)

## 2019-07-27 LAB — VANCOMYCIN, TROUGH: Vancomycin Tr: 11 ug/mL — ABNORMAL LOW (ref 15–20)

## 2019-07-27 LAB — HEPARIN LEVEL (UNFRACTIONATED): Heparin Unfractionated: 0.94 IU/mL — ABNORMAL HIGH (ref 0.30–0.70)

## 2019-07-27 MED ORDER — MUPIROCIN 2 % EX OINT
1.0000 "application " | TOPICAL_OINTMENT | Freq: Two times a day (BID) | CUTANEOUS | Status: DC
  Administered 2019-07-27 – 2019-07-28 (×3): 1 via NASAL
  Filled 2019-07-27 (×2): qty 22

## 2019-07-27 MED ORDER — ELIQUIS 5 MG VTE STARTER PACK: ORAL_TABLET | ORAL | 0 refills | Status: DC

## 2019-07-27 MED ORDER — SODIUM CHLORIDE 0.9% FLUSH: 10.0000 mL | INTRAVENOUS | Status: DC | PRN

## 2019-07-27 MED ORDER — CHLORHEXIDINE GLUCONATE CLOTH 2 % EX PADS
6.0000 | MEDICATED_PAD | Freq: Every day | CUTANEOUS | Status: DC
  Administered 2019-07-27 – 2019-07-28 (×2): 6 via TOPICAL

## 2019-07-27 MED ORDER — LINEZOLID 600 MG PO TABS
600.0000 mg | ORAL_TABLET | Freq: Two times a day (BID) | ORAL | Status: DC
  Administered 2019-07-27 – 2019-07-28 (×3): 600 mg via ORAL
  Filled 2019-07-27 (×4): qty 1

## 2019-07-27 MED ORDER — APIXABAN 5 MG PO TABS
10.0000 mg | ORAL_TABLET | Freq: Two times a day (BID) | ORAL | Status: DC
  Administered 2019-07-27 – 2019-07-28 (×3): 10 mg via ORAL
  Filled 2019-07-27 (×3): qty 2

## 2019-07-27 MED ORDER — ENSURE ENLIVE PO LIQD
237.0000 mL | Freq: Two times a day (BID) | ORAL | Status: DC
  Administered 2019-07-28 (×2): 237 mL via ORAL

## 2019-07-27 MED ORDER — LINEZOLID 600 MG PO TABS: 600.0000 mg | ORAL_TABLET | Freq: Two times a day (BID) | ORAL | 0 refills | Status: DC

## 2019-07-27 MED FILL — LINEZOLID 600 MG TABS: 600 | 30 days supply | Qty: 60 | Fill #0

## 2019-07-27 MED FILL — ELIQUIS STARTER PACK 5 MG T: 5 | 30 days supply | Qty: 74 | Fill #0

## 2019-07-27 NOTE — Discharge Instructions (Addendum)
° °-  Keep penrose drain site dry. Change dry dressing over it at least once per day. Begin backing out the penrose by about 1/2" per day.    Information on my medicine - ELIQUIS (apixaban)   Why was Eliquis prescribed for you? Eliquis was prescribed to treat blood clots that may have been found in the veins of your legs (deep vein thrombosis) or in your lungs (pulmonary embolism) and to reduce the risk of them occurring again.  What do You need to know about Eliquis ? The starting dose is 10 mg (two 5 mg tablets) taken TWICE daily for the FIRST SEVEN (7) DAYS, then the dose is reduced to ONE 5 mg tablet taken TWICE daily.  Eliquis may be taken with or without food.   Try to take the dose about the same time in the morning and in the evening. If you have difficulty swallowing the tablet whole please discuss with your pharmacist how to take the medication safely.  Take Eliquis exactly as prescribed and DO NOT stop taking Eliquis without talking to the doctor who prescribed the medication.  Stopping may increase your risk of developing a new blood clot.  Refill your prescription before you run out.  After discharge, you should have regular check-up appointments with your healthcare provider that is prescribing your Eliquis.    What do you do if you miss a dose? If a dose of ELIQUIS is not taken at the scheduled time, take it as soon as possible on the same day and twice-daily administration should be resumed. The dose should not be doubled to make up for a missed dose.  Important Safety Information A possible side effect of Eliquis is bleeding. You should call your healthcare provider right away if you experience any of the following: ? Bleeding from an injury or your nose that does not stop. ? Unusual colored urine (red or dark brown) or unusual colored stools (red or black). ? Unusual bruising for unknown reasons. ? A serious fall or if you hit your head (even if there is no  bleeding).  Some medicines may interact with Eliquis and might increase your risk of bleeding or clotting while on Eliquis. To help avoid this, consult your healthcare provider or pharmacist prior to using any new prescription or non-prescription medications, including herbals, vitamins, non-steroidal anti-inflammatory drugs (NSAIDs) and supplements.  This website has more information on Eliquis (apixaban): http://www.eliquis.com/eliquis/home

## 2019-07-27 NOTE — Progress Notes (Signed)
PROGRESS NOTE    [name]  [id number] DOB: 20-Jul-1991 DOA: 07/25/2019 PCP: Patient, No Pcp Per   Brief Narrative:  28 year old male with history of IV drug use, history of neck abscess for which he was hospitalized in March this year and treated with broad-spectrum antibiotic as inpatient and discharged on oral clindamycin, reported history of hep C, alcohol and tobacco use presented with left neck cellulitis and abscess with associated thrombosis of left internal jugular along nearly its entire length. Patient placed on empiric IV antibiotics, IV heparin. ENT and ID consulted.  Assessment & Plan:   Active Problems:   IV drug abuse ([place])   Hepatitis C   Tobacco abuse   Neck abscess   Internal jugular vein thrombosis, left (HCC)   L IJ Thrombosis with Neck Abscess/Myositis CT from 8/25 with worsening L cervical abscess  S/p I/D on 26th with ENT Appreciate ID recommendations - continue vanc/levaquin/flagyl Will need 3-6 months anticoagulation  IV heparin resumed post op - transition to [place] - pharmacy to assist with transition to eliquis Echo with normal systolic function, no valvular vegetation noted Cultures pending - preliminary staph - follow for resistance  Self reported history of seizure Patient indicated the morning of 8/28 he had previous seizure and was hospitalized at outside facility and placed on keppra but has run out of medication for >30 days at this point No information available from [facility] yet - [facility] does not carry this diagnosis Hold of on Keppra and attempt to obtain records - possibly a withdrawal seizure given history of polysubstance abuse in which case no need for ongoing AEDs.  Hx IVDU: denies recent IVDU.  Continue suboxone BID.  Hx Chronic Hepatitis C: follow hepatitis C viral load.  May need outpatient treatment.  Hypokalemia: continue to monitor  Tobacco Use: encourage cessation, nicotine patch  Left posterior basilar infiltrate:  atelectasis vs infiltrate.  No c/o SOB, suspect dependent atelectasis.  Follow clinically  DVT prophylaxis: heparin gtt -> transition to eliquis as above Code Status: full Disposition Plan: pending clinical improvement and ongoing need for IV abx - likely transition to po abx and po anticoagulation in the next 24-48h pending cultures.  Consultants:   ENT  ID  Procedures:   I/D 8/26  Antimicrobials: Anti-infectives (From admission, onward)   Start     Dose/Rate Route Frequency Ordered Stop   07/25/19 2100  levofloxacin (LEVAQUIN) IVPB 750 mg  Status:  Discontinued     750 mg 100 mL/hr over 90 Minutes Intravenous Every 24 hours 07/25/19 1524 07/26/19 1354   07/25/19 2000  metroNIDAZOLE (FLAGYL) IVPB 500 mg  Status:  Discontinued     500 mg 100 mL/hr over 60 Minutes Intravenous Every 8 hours 07/25/19 1524 07/26/19 1354   07/25/19 1830  vancomycin (VANCOCIN) 1,250 mg in sodium chloride 0.9 % 250 mL IVPB     1,250 mg 166.7 mL/hr over 90 Minutes Intravenous Every 12 hours 07/25/19 1645     07/25/19 1800  vancomycin (VANCOCIN) IVPB 1000 mg/200 mL premix  Status:  Discontinued     1,000 mg 200 mL/hr over 60 Minutes Intravenous Every 8 hours 07/25/19 1524 07/25/19 1645     Subjective: No acute issues or events overnight, tolerating p.o. well, pain moderately well controlled currently declines chest pain, shortness of breath, nausea, vomiting, diarrhea, constipation, headache, fevers, chills.  Objective: Vitals:   07/26/19 0211 07/26/19 1504 07/26/19 2022 07/27/19 0438  BP: 111/67 116/72 117/75 120/84  Pulse: 82 88 67  66  Resp: 18 18 16 16   Temp: 98.3 F (36.8 C) 97.8 F (36.6 C) 98.5 F (36.9 C) (!) 97.5 F (36.4 C)  TempSrc: Oral Oral Oral Oral  SpO2: 100% 100% 100% 99%    Intake/Output Summary (Last 24 hours) at 07/27/2019 0840 Last data filed at 07/26/2019 1640 Gross per 24 hour  Intake 1278.23 ml  Output -  Net 1278.23 ml   There were no vitals filed for this visit.   Examination:  General exam: Appears calm and comfortable  Neck: Bandage clean dry intact Respiratory system: Clear to auscultation. Respiratory effort normal. Cardiovascular system: S1 & S2 heard, RRR.  Gastrointestinal system: Abdomen is nondistended, soft and nontender.  Central nervous system: Alert and oriented. No focal neurological deficits. Extremities: no LEE Psychiatry: Judgement and insight appear normal. Mood & affect appropriate.   Data Reviewed: I have personally reviewed following labs and imaging studies  CBC: Recent Labs  Lab 07/22/19 1700 07/23/19 0607 07/24/19 1050 07/25/19 0322 07/26/19 0327 07/27/19 0611  WBC 10.4 9.4 11.0* 10.3 7.7 5.1  NEUTROABS 7.8*  --   --   --   --   --   HGB 11.7* 11.1* 10.7* 9.7* 10.8* 10.7*  HCT 35.9* 33.9* 33.0* 29.8* 32.9* 33.1*  MCV 84.3 83.9 85.9 85.4 83.3 85.1  PLT 439* 401* 390 389 478* 0000000*   Basic Metabolic Panel: Recent Labs  Lab 07/22/19 1700 07/23/19 0607 07/24/19 1050 07/26/19 0327 07/27/19 0611  NA 137 138  --  139 142  K 3.8 3.2*  --  3.6 3.8  CL 97* 100  --  102 106  CO2 28 25  --  26 27  GLUCOSE 112* 119*  --  107* 105*  BUN 7 7  --  5* 5*  CREATININE 0.62 0.61 0.75 0.65 0.67  CALCIUM 8.9 8.6*  --  8.8* 8.8*   GFR: Estimated Creatinine Clearance: 118.5 mL/min (by C-G formula based on SCr of 0.67 mg/dL).   Liver Function Tests: Recent Labs  Lab 07/22/19 1700 07/26/19 0327  AST 10* 12*  ALT 12 10  ALKPHOS 74 58  BILITOT 0.5 0.8  PROT 8.1 6.9  ALBUMIN 3.6 2.6*   Coagulation Profile: Recent Labs  Lab 07/22/19 1700  INR 1.0    Recent Results (from the past 240 hour(s))  SARS CORONAVIRUS 2 Nasal Swab Aptima Multi Swab     Status: None   Collection Time: 07/22/19  8:03 PM   Specimen: Aptima Multi Swab; Nasal Swab  Result Value Ref Range Status   SARS Coronavirus 2 NEGATIVE NEGATIVE Final    Comment: (NOTE) SARS-CoV-2 target nucleic acids are NOT DETECTED. The SARS-CoV-2 RNA is  generally detectable in upper and lower respiratory specimens during the acute phase of infection. Negative results do not preclude SARS-CoV-2 infection, do not rule out co-infections with other pathogens, and should not be used as the sole basis for treatment or other patient management decisions. Negative results must be combined with clinical observations, patient history, and epidemiological information. The expected result is Negative. Fact Sheet for Patients: SugarRoll.be Fact Sheet for Healthcare Providers: https://www.woods-mathews.com/ This test is not yet approved or cleared by the [place] FDA and  has been authorized for detection and/or diagnosis of SARS-CoV-2 by FDA under an Emergency Use Authorization (EUA). This EUA will remain  in effect (meaning this test can be used) for the duration of the COVID-19 declaration under Section 56 4(b)(1) of the Act, 21 U.S.C. section 360bbb-3(b)(1), unless the  authorization is terminated or revoked sooner. Performed at [facility], [place] [address] [place], [place] [address]   Culture, blood (Routine X 2) w Reflex to ID Panel     Status: None (Preliminary result)   Collection Time: 07/24/19 12:26 AM   Specimen: BLOOD  Result Value Ref Range Status   Specimen Description   Final    BLOOD BLOOD RIGHT HAND Performed at [facility] [address] [place], [place] [address]    Special Requests   Final    BOTTLES DRAWN AEROBIC ONLY Blood Culture adequate volume   Culture   Final    NO GROWTH 2 DAYS Performed at [facility], [place] [address] [place], [place] [address]    Report Status PENDING  Incomplete  Surgical pcr screen     Status: Abnormal   Collection Time: 07/25/19  2:41 PM   Specimen: Nasal Mucosa; Nasal Swab  Result Value Ref Range Status   MRSA, PCR POSITIVE (A) NEGATIVE Final    Comment: RESULT CALLED TO, READ BACK BY AND VERIFIED WITH:  BAUMGARDNER,C. RN @1839  ON 08.26.2020 BY COHEN,K    Staphylococcus aureus POSITIVE (A) NEGATIVE Final    Comment: (NOTE) The Xpert SA Assay (FDA approved for NASAL specimens in patients 68 years of age and older), is one component of a comprehensive surveillance program. It is not intended to diagnose infection nor to guide or monitor treatment. Performed at [facility], [place] [address], [place], [place] [address]   Aerobic/Anaerobic Culture (surgical/deep wound)     Status: None (Preliminary result)   Collection Time: 07/25/19  4:44 PM   Specimen: Abscess  Result Value Ref Range Status   Specimen Description ABSCESS  Final   Special Requests SAMPLE A LEFT NECK  Final   Gram Stain   Final    FEW WBC PRESENT, PREDOMINANTLY PMN RARE GRAM POSITIVE COCCI    Culture   Final    FEW STAPHYLOCOCCUS AUREUS SUSCEPTIBILITIES TO FOLLOW Performed at [facility], [place] [address] [place], [place] [address]    Report Status PENDING  Incomplete    Radiology Studies: No results found.   LOS: 2 days   Time spent: over 30 min  Little Ishikawa, MD [facility] Pager AMION  If 7PM-7AM, please contact night-coverage [email or web address] Password [email or web address] 07/27/2019, 8:40 AM

## 2019-07-27 NOTE — Progress Notes (Signed)
Patient ID: [name], male   DOB: Dec 22, 1990, 28 y.o.   MRN: [id number]         [facility] for Infectious Disease  Date of Admission:  07/25/2019           Day 5 vancomycin         ASSESSMENT: He is growing staph aureus from his neck abscess culture.  He had MRSA infection back in March.  Given his history of beta-lactam allergies (seem to have tolerated cephalexin) I will complete therapy with oral linezolid.  PLAN: 1. Change vancomycin to linezolid 600 mg by mouth twice daily (discharge prescription for 60 tablets 2. Hepatitis C viral load 3. He will follow-up with me in clinic on 9/92020 to determine optimal duration of therapy 4. I will sign off for now  Active Problems:   IV drug abuse ([place])   Hepatitis C   Tobacco abuse   Neck abscess   Internal jugular vein thrombosis, left (HCC)   Scheduled Meds: . buprenorphine-naloxone  1 tablet Sublingual BID  . Chlorhexidine Gluconate Cloth  6 each Topical Q0600  . mupirocin ointment  1 application Nasal BID  . nicotine  21 mg Transdermal Daily   Continuous Infusions: . heparin 2,600 Units/hr (07/27/19 1024)  . vancomycin 1,250 mg (07/27/19 0624)   PRN Meds:.morphine injection, nicotine polacrilex, ondansetron **OR** ondansetron (ZOFRAN) IV, oxyCODONE, sodium chloride flush   SUBJECTIVE: He is feeling much better today.  Review of Systems: Review of Systems  Constitutional: Negative for chills, diaphoresis and fever.  HENT: Negative for sore throat.        No painful or loose teeth.  Gastrointestinal: Negative for abdominal pain, diarrhea, nausea and vomiting.    Allergies  Allergen Reactions  . Amoxicillin     Steven-Johnson's Syndrome  . Ceclor [Cefaclor] Other (See Comments)    Steven-Johnson's Syndrome  . Erythromycin     Steven-Johnson's Syndrome  . Penicillins     Steven-Johnson's Syndrome  . Septra [Sulfamethoxazole-Trimethoprim]     Steven-Johnson's Syndrome  . Tylenol [Acetaminophen]      OBJECTIVE: Vitals:   07/26/19 1504 07/26/19 2022 07/27/19 0438 07/27/19 1009  BP: 116/72 117/75 120/84   Pulse: 88 67 66   Resp: 18 16 16    Temp: 97.8 F (36.6 C) 98.5 F (36.9 C) (!) 97.5 F (36.4 C)   TempSrc: Oral Oral Oral   SpO2: 100% 100% 99%   Height:    5\' 5"  (1.651 m)   Body mass index is 25.83 kg/m.  Physical Exam Constitutional:      Comments: He is alert and pleasant resting quietly in bed.  Neck:     Comments: He has a clean dry gauze dressing over his left neck incision. Cardiovascular:     Rate and Rhythm: Normal rate and regular rhythm.     Heart sounds: No murmur.  Pulmonary:     Effort: Pulmonary effort is normal.     Breath sounds: Normal breath sounds.  Psychiatric:        Mood and Affect: Mood normal.     Lab Results Lab Results  Component Value Date   WBC 5.1 07/27/2019   HGB 10.7 (L) 07/27/2019   HCT 33.1 (L) 07/27/2019   MCV 85.1 07/27/2019   PLT 514 (H) 07/27/2019    Lab Results  Component Value Date   CREATININE 0.67 07/27/2019   BUN 5 (L) 07/27/2019   NA 142 07/27/2019   K 3.8 07/27/2019   CL 106 07/27/2019  CO2 27 07/27/2019    Lab Results  Component Value Date   ALT 10 07/26/2019   AST 12 (L) 07/26/2019   ALKPHOS 58 07/26/2019   BILITOT 0.8 07/26/2019     Microbiology: Recent Results (from the past 240 hour(s))  SARS CORONAVIRUS 2 Nasal Swab Aptima Multi Swab     Status: None   Collection Time: 07/22/19  8:03 PM   Specimen: Aptima Multi Swab; Nasal Swab  Result Value Ref Range Status   SARS Coronavirus 2 NEGATIVE NEGATIVE Final    Comment: (NOTE) SARS-CoV-2 target nucleic acids are NOT DETECTED. The SARS-CoV-2 RNA is generally detectable in upper and lower respiratory specimens during the acute phase of infection. Negative results do not preclude SARS-CoV-2 infection, do not rule out co-infections with other pathogens, and should not be used as the sole basis for treatment or other patient management decisions.  Negative results must be combined with clinical observations, patient history, and epidemiological information. The expected result is Negative. Fact Sheet for Patients: SugarRoll.be Fact Sheet for Healthcare Providers: https://www.woods-mathews.com/ This test is not yet approved or cleared by the [place] FDA and  has been authorized for detection and/or diagnosis of SARS-CoV-2 by FDA under an Emergency Use Authorization (EUA). This EUA will remain  in effect (meaning this test can be used) for the duration of the COVID-19 declaration under Section 56 4(b)(1) of the Act, 21 U.S.C. section 360bbb-3(b)(1), unless the authorization is terminated or revoked sooner. Performed at [facility], [place] [address], [place], [place] [address]   Culture, blood (Routine X 2) w Reflex to ID Panel     Status: None (Preliminary result)   Collection Time: 07/24/19 12:26 AM   Specimen: BLOOD  Result Value Ref Range Status   Specimen Description   Final    BLOOD BLOOD RIGHT HAND Performed at [facility] [address] [place], [place] [address]    Special Requests   Final    BOTTLES DRAWN AEROBIC ONLY Blood Culture adequate volume   Culture   Final    NO GROWTH 2 DAYS Performed at [facility], [place] [address], [place], [place] [address]    Report Status PENDING  Incomplete  Surgical pcr screen     Status: Abnormal   Collection Time: 07/25/19  2:41 PM   Specimen: Nasal Mucosa; Nasal Swab  Result Value Ref Range Status   MRSA, PCR POSITIVE (A) NEGATIVE Final    Comment: RESULT CALLED TO, READ BACK BY AND VERIFIED WITH: BAUMGARDNER,C. RN @1839  ON 08.26.2020 BY COHEN,K    Staphylococcus aureus POSITIVE (A) NEGATIVE Final    Comment: (NOTE) The Xpert SA Assay (FDA approved for NASAL specimens in patients 39 years of age and older), is one component of a comprehensive surveillance program. It is not intended to  diagnose infection nor to guide or monitor treatment. Performed at [facility], [place] [address], [place], [place] [address]   Aerobic/Anaerobic Culture (surgical/deep wound)     Status: None (Preliminary result)   Collection Time: 07/25/19  4:44 PM   Specimen: Abscess  Result Value Ref Range Status   Specimen Description ABSCESS  Final   Special Requests SAMPLE A LEFT NECK  Final   Gram Stain   Final    FEW WBC PRESENT, PREDOMINANTLY PMN RARE GRAM POSITIVE COCCI    Culture   Final    FEW STAPHYLOCOCCUS AUREUS SUSCEPTIBILITIES TO FOLLOW Performed at [facility], [place]  [address] [place], [place] [address]    Report Status PENDING  Incomplete    Michel Bickers, MD [facility] [place] [facility] [phone] pager   [phone] cell 07/27/2019, 11:01 AM

## 2019-07-27 NOTE — Progress Notes (Addendum)
[place] for IV heparin to Eliquis Indication: VTE treatment  Allergies  Allergen Reactions  . Amoxicillin     Steven-Johnson's Syndrome  . Ceclor [Cefaclor] Other (See Comments)    Steven-Johnson's Syndrome  . Erythromycin     Steven-Johnson's Syndrome  . Penicillins     Steven-Johnson's Syndrome  . Septra [Sulfamethoxazole-Trimethoprim]     Steven-Johnson's Syndrome  . Tylenol [Acetaminophen]     Patient Measurements: Heparin Dosing Weight: 72.6  Vital Signs: Temp: 97.5 F (36.4 C) (08/28 0438) Temp Source: Oral (08/28 0438) BP: 120/84 (08/28 0438) Pulse Rate: 66 (08/28 0438)  Labs: Recent Labs    07/24/19 1050 07/25/19 0322 07/26/19 0327 07/27/19 0611  HGB 10.7* 9.7* 10.8* 10.7*  HCT 33.0* 29.8* 32.9* 33.1*  PLT 390 389 478* 514*  HEPARINUNFRC <0.10* 0.49 0.51 0.94*  CREATININE 0.75  --  0.65 0.67    Estimated Creatinine Clearance: 118.5 mL/min (by C-G formula based on SCr of 0.67 mg/dL).     Assessment: 28 yo male presented with neck swelling, redness, warmth on IV heparin per pharmacy for VTE treatment per MD order. 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  Pt S/P I & D of neck abscess this afternoon at [facility].   8/27 AM update:  Heparin level 0.94 this AM, CBC stable  8/27 PM update: Transition to Eliquis  Goal of Therapy:  Heparin level 0.3-0.7 units/ml Monitor platelets by anticoagulation protocol: Yes   Plan:  Eliquis 10 mg po BID x 7 days then 5 mg po BID Eliquis discharge kit supplied from [facility] for patient to take home  Thank you Anette Guarneri, PharmD [phone]

## 2019-07-27 NOTE — Progress Notes (Signed)
CRITICAL VALUE ALERT  Critical Value:  Vanc 6  Date & Time Notied:  07/27/2019 at [place]  Provider Notified: Holli Humbles, MD  Orders Received/Actions taken: Awaiting MD response   Will continue to monitor.

## 2019-07-27 NOTE — Anesthesia Postprocedure Evaluation (Signed)
Anesthesia Post Note  Patient: [name]  Procedure(s) Performed: INCISION AND DRAINAGE NECK ABSCESS (Left Neck)     Patient location during evaluation: PACU Anesthesia Type: General Level of consciousness: awake and alert Pain management: pain level controlled Vital Signs Assessment: post-procedure vital signs reviewed and stable Respiratory status: spontaneous breathing, nonlabored ventilation, respiratory function stable and patient connected to nasal cannula oxygen Cardiovascular status: blood pressure returned to baseline and stable Postop Assessment: no apparent nausea or vomiting Anesthetic complications: no    Last Vitals:  Vitals:   07/26/19 2022 07/27/19 0438  BP: 117/75 120/84  Pulse: 67 66  Resp: 16 16  Temp: 36.9 C (!) 36.4 C  SpO2: 100% 99%    Last Pain:  Vitals:   07/27/19 0438  TempSrc: Oral  PainSc:    Pain Goal:                   [place]

## 2019-07-27 NOTE — Plan of Care (Signed)
  Problem: Activity: Goal: Risk for activity intolerance will decrease Outcome: Progressing   Problem: Nutrition: Goal: Adequate nutrition will be maintained Outcome: Progressing   Problem: Coping: Goal: Level of anxiety will decrease Outcome: Progressing   

## 2019-07-27 NOTE — Plan of Care (Signed)
Problem: Education: Goal: Knowledge of General Education information will improve Description: Including pain rating scale, medication(s)/side effects and non-pharmacologic comfort measures Outcome: Progressing   Problem: Health Behavior/Discharge Planning: Goal: Ability to manage health-related needs will improve Outcome: Progressing   Problem: Clinical Measurements: Goal: Ability to maintain clinical measurements within normal limits will improve Outcome: Progressing Goal: Will remain free from infection Outcome: Progressing Goal: Respiratory complications will improve Outcome: Progressing Goal: Cardiovascular complication will be avoided Outcome: Progressing   Problem: Activity: Goal: Risk for activity intolerance will decrease Outcome: Progressing   Problem: Nutrition: Goal: Adequate nutrition will be maintained Outcome: Progressing   Problem: Coping: Goal: Level of anxiety will decrease Outcome: Progressing   Problem: Pain Managment: Goal: General experience of comfort will improve Outcome: Progressing   Problem: Safety: Goal: Ability to remain free from injury will improve Outcome: Progressing   Problem: Skin Integrity: Goal: Risk for impaired skin integrity will decrease Outcome: Progressing   

## 2019-07-27 NOTE — Progress Notes (Signed)
Dressing to neck changed as ordered. Pt tolerated well. Will continue to monitor.

## 2019-07-28 DIAGNOSIS — Z720 Tobacco use: Secondary | ICD-10-CM

## 2019-07-28 LAB — BASIC METABOLIC PANEL
Anion gap: 10 (ref 5–15)
BUN: 7 mg/dL (ref 6–20)
CO2: 27 mmol/L (ref 22–32)
Calcium: 9 mg/dL (ref 8.9–10.3)
Chloride: 103 mmol/L (ref 98–111)
Creatinine, Ser: 0.7 mg/dL (ref 0.61–1.24)
GFR calc Af Amer: >60 mL/min (ref >60)
GFR calc non Af Amer: >60 mL/min (ref >60)
Glucose, Bld: 105 mg/dL — ABNORMAL HIGH (ref 70–99)
Potassium: 3.5 mmol/L (ref 3.5–5.1)
Sodium: 140 mmol/L (ref 135–145)

## 2019-07-28 LAB — HEPATITIS C GENOTYPE

## 2019-07-28 MED ORDER — APIXABAN 5 MG PO TABS: 5.0000 mg | ORAL_TABLET | Freq: Two times a day (BID) | ORAL | Status: DC

## 2019-07-28 NOTE — Progress Notes (Signed)
   ENT Progress Note:    Procedure(s): INCISION AND DRAINAGE NECK ABSCESS 07/25/19   Subjective: Pain and swelling neck  Objective: Vital signs in last 24 hours: Temp:  [98 F (36.7 C)-98.6 F (37 C)] 98.6 F (37 C) (08/29 0851) Pulse Rate:  [69-92] 69 (08/29 0851) Resp:  [16-18] 18 (08/29 0851) BP: (109-126)/(71-85) 113/81 (08/29 0851) SpO2:  [99 %-100 %] 100 % (08/29 0851) Weight change:  Last BM Date: 07/27/19  Intake/Output from previous day: 08/28 0701 - 08/29 0700 In: 840 [P.O.:840] Out: -  Intake/Output this shift: No intake/output data recorded.  Labs: Recent Labs    07/26/19 0327 07/27/19 0611  WBC 7.7 5.1  HGB 10.8* 10.7*  HCT 32.9* 33.1*  PLT 478* 514*   Recent Labs    07/27/19 0611 07/28/19 0740  NA 142 140  K 3.8 3.5  CL 106 103  CO2 27 27  GLUCOSE 105* 105*  BUN 5* 7  CALCIUM 8.8* 9.0    Studies/Results: No results found.   PHYSICAL EXAM: Voice is normal.  Neck is firm but not fluctuant.  The Penrose demonstrates a moderate amount of soupy debris around the incision site.  Slowly backed out Penrose by a small knuckle.   Patient switched from Vanco to linezolid p.o.  Assessment/Plan: Left neck abscess status post incision and drainage by my partner Dr. Wilburn Cornelia.  Still some soupy drainage from the Penrose. Okay from ENT standpoint to go home with Penrose and may follow-up with Dr. Wilburn Cornelia in clinic on Tuesday, 9/1.  Please make sure the patient calls to make an appointment have this removed next week.     Helayne Seminole, MD 07/28/2019, 10:41 AM

## 2019-07-28 NOTE — Discharge Summary (Signed)
Physician Discharge Summary  DELMORE SEAR ION:[id number] DOB: [birth year] DOA: 07/25/2019  PCP: Patient, No Pcp Per  Admit date: 07/25/2019 Discharge date: 07/28/2019  Admitted From: Home Disposition: Home  Recommendations for Outpatient Follow-up:  1. Follow up with PCP in 1-2 weeks 2. Please obtain BMP/CBC in one week  Discharge Condition: Guarded CODE STATUS: Full Diet recommendation: As tolerated  Brief/Interim Summary: 28 year old male with history of IV drug use, history of neck abscess for which he was hospitalized in March this year and treated with broad-spectrum antibiotic as inpatient and discharged on oral clindamycin, reported history of hep C, alcohol and tobacco use presented with left neck cellulitis and abscess with associated thrombosis of left internal jugular along nearly its entire length. Patient placed on empiric IV antibiotics, IV heparin. ENT and ID consulted.  Patient met as above with neck abscess now status post I&D by ENT.  ID following, we have de-escalate patient's antibiotics to linezolid p.o.  Patient also incidentally noted to have left IJ thrombosis now transition to Eliquis p.o. tolerating well.  Patient evaluated by ENT agreeable for discharge home with close follow-up in the outpatient setting for further management of Penrose drain given marked improvement in symptoms.  Lengthy discussion at bedside with patient about need for medication compliance, IV substance abuse cessation.  Patient also admitted questionable recent history of seizure-like activity and indicates he was recently placed on Keppra in an outside facility however his records cannot be obtained, no available prescription for Keppra noted at patient's pharmacy. Patient will need close follow-up with PCP and/or neurology for continuation of this medication if indeed indicated.  Patient otherwise stable and agreeable for discharge home.  Discharge Diagnoses:  Active Problems:   IV drug abuse  ([facility])   Hepatitis C   Tobacco abuse   Neck abscess   Internal jugular vein thrombosis, left (H[facility])  Discharge Instructions   Allergies as of 07/28/2019      Reactions   Amoxicillin    Steven-Johnson's Syndrome   Ceclor [cefaclor] Other (See Comments)   Steven-Johnson's Syndrome   Erythromycin    Steven-Johnson's Syndrome   Penicillins    Steven-Johnson's Syndrome   Septra [sulfamethoxazole-trimethoprim]    Steven-Johnson's Syndrome   Tylenol [acetaminophen]       Medication List    TAKE these medications   BC HEADACHE POWDER PO Take 1 packet by mouth every 6 (six) hours as needed (headache).   Eliquis DVT/PE Starter Pack 5 MG Tabs Take as directed on package: start with two-37m tablets twice daily for 7 days. On day 8, switch to one-578mtablet twice daily.   ibuprofen 200 MG tablet Commonly known as: ADVIL Take 800 mg by mouth every 6 (six) hours as needed for headache.   linezolid 600 MG tablet Commonly known as: ZYVOX Take 1 tablet (600 mg total) by mouth 2 (two) times daily.   Suboxone 8-2 MG Film Generic drug: Buprenorphine HCl-Naloxone HCl Place 1 Film under the tongue 2 (two) times daily.      Follow-up Information    ShJerrell BelfastMD. Schedule an appointment as soon as possible for a visit.   Specialty: Otolaryngology Why: Make an appt to be seen on Tuesday, 9/1 for wound recheck and drain removal Contact information: [address] [place] [phone]        Allergies  Allergen Reactions  . Amoxicillin     Steven-Johnson's Syndrome  . Ceclor [Cefaclor] Other (See Comments)  Steven-Johnson's Syndrome  . Erythromycin     Steven-Johnson's Syndrome  . Penicillins     Steven-Johnson's Syndrome  . Septra [Sulfamethoxazole-Trimethoprim]     Steven-Johnson's Syndrome  . Tylenol [Acetaminophen]     Consultations:  ENT Dr Wilburn Cornelia   Procedures/Studies: Dg Chest 2 View  Result Date:  07/22/2019 CLINICAL DATA:  Neck swelling EXAM: CHEST - 2 VIEW COMPARISON:  11/25/2018 FINDINGS: Bibasilar atelectasis. Heart is normal size. No effusions. No acute bony abnormality. IMPRESSION: Bibasilar atelectasis. Electronically Signed   By: Rolm Baptise M.D.   On: 07/22/2019 17:29   Ct Soft Tissue Neck W Contrast  Result Date: 07/22/2019 CLINICAL DATA:  Neck swelling for 1 week. EXAM: CT NECK WITH CONTRAST TECHNIQUE: Multidetector CT imaging of the neck was performed using the standard protocol following the bolus administration of intravenous contrast. CONTRAST:  1m OMNIPAQUE IOHEXOL 300 MG/ML  SOLN COMPARISON:  CT neck 02/06/2019 FINDINGS: PHARYNX AND LARYNX: --Nasopharynx: Fossae of Rosenmuller are clear. Normal adenoid tonsils for age. --Oral cavity and oropharynx: The palatine and lingual tonsils are normal. The visible oral cavity and floor of mouth are normal. --Hypopharynx: Normal vallecula and pyriform sinuses. --Larynx: Normal epiglottis and pre-epiglottic space. Normal aryepiglottic and vocal folds. --Retropharyngeal space: No abscess, effusion or lymphadenopathy. SALIVARY GLANDS: --Parotid: No mass lesion or inflammation. No sialolithiasis or ductal dilatation. --Submandibular: Left submandibular gland is enlarged. --Sublingual: Normal. No ranula or other visible lesion of the base of tongue and floor of mouth. THYROID: Normal. LYMPH NODES: Left level 1B nodes measure up to 10 mm. VASCULAR: There is thrombosis of the left internal jugular vein along nearly its entire length. At the level of the thyroid cartilage, there is an adjacent abscess that is continuous with the anterior aspect of the internal jugular vein (series 3, image 71) that measures 18 x 20 mm. There is marked swelling of the overlying sternocleidomastoid muscle with inflammatory induration the left neck subcutaneous tissues. LIMITED INTRACRANIAL: Normal. VISUALIZED ORBITS: Normal. MASTOIDS AND VISUALIZED PARANASAL SINUSES: No  fluid levels or advanced mucosal thickening. No mastoid effusion. SKELETON: No bony spinal canal stenosis. No lytic or blastic lesions. UPPER CHEST: Clear. OTHER: None. IMPRESSION: 1. 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 2. Left sternocleidomastoid myositis and left neck cellulitis with reactive left cervical lymphadenopathy. Critical Value/emergent results were called by telephone at the time of interpretation on 07/22/2019 at 7:53 pm to Dr. KFrancine Graven, who verbally acknowledged these results. Electronically Signed   By: KUlyses JarredM.D.   On: 07/22/2019 19:56   Ct Chest W Contrast  Result Date: 07/24/2019 CLINICAL DATA:  Cellulitis of left chest. EXAM: CT CHEST WITH CONTRAST TECHNIQUE: Multidetector CT imaging of the chest was performed during intravenous contrast administration. CONTRAST:  729mOMNIPAQUE IOHEXOL 300 MG/ML  SOLN COMPARISON:  CT scan of July 22, 2019. FINDINGS: Cardiovascular: There is again noted thrombosis of the left internal jugular vein. There is no evidence of thoracic aortic dissection or aneurysm. Normal cardiac size. No pericardial effusion. Mediastinum/Nodes: The esophagus and thyroid gland are unremarkable. There is a large area of abnormal soft tissue density seen in the left cervical and supraclavicular region which is displacing the thyroid gland and trachea to the right slightly. Fluid collection measuring 5.6 x 3.1 cm is noted within this area consistent with abscess which is significantly enlarged compared to prior exam. Some inflammatory changes are seen extending into the retrosternal space. 12 mm right paratracheal lymph  node is noted which most likely is inflammatory in etiology. Lungs/Pleura: No pneumothorax or pleural effusion is noted. Right lung is clear. Left posterior basilar subsegmental atelectasis or inflammation is noted. Upper Abdomen: No acute abnormality. Musculoskeletal: No chest  wall abnormality. No acute or significant osseous findings. IMPRESSION: Left cervical abscess noted on prior exam is significantly enlarged currently, measuring 5.6 x 3.1 cm. There remains a large amount of inflammatory material as well which may be enlarged compared to prior exam. This causes some displacement of the thyroid gland and trachea to the right. Some extension of inflammatory changes are seen extending into the superior portion of the retrosternal space. Right paratracheal lymph node is noted which most likely is inflammatory in etiology. Stable thrombosis of left internal jugular vein is noted. Left posterior basilar opacity is noted concerning for subsegmental atelectasis or possibly infiltrate. Electronically Signed   By: Marijo Conception M.D.   On: 07/24/2019 20:27    Subjective: No acute issues or events overnight, tolerating p.o. well, ambulating without difficulty, denies chest pain, shortness of breath, nausea, vomiting, diarrhea, constipation, headache, fevers, chills.  Discharge Exam: Vitals:   07/28/19 0851 07/28/19 1307  BP: 113/81 113/80  Pulse: 69 87  Resp: 18 18  Temp: 98.6 F (37 C) 98.1 F (36.7 C)  SpO2: 100% 99%   Vitals:   07/27/19 2053 07/28/19 0541 07/28/19 0851 07/28/19 1307  BP: 120/84 109/71 113/81 113/80  Pulse: 87 70 69 87  Resp:  16 18 18   Temp: 98.6 F (37 C) 98 F (36.7 C) 98.6 F (37 C) 98.1 F (36.7 C)  TempSrc: Oral Oral Oral Oral  SpO2: 99% 99% 100% 99%  Height:        General:  Pleasantly resting in bed, No acute distress. HEENT: Anterior neck Penrose drain remains intact, minimal output without surrounding erythema or exudate. Neck:  Without mass or deformity.  Trachea is midline. Lungs:  Clear to auscultate bilaterally without rhonchi, wheeze, or rales. Heart:  Regular rate and rhythm.  Without murmurs, rubs, or gallops. Abdomen:  Soft, nontender, nondistended.  Without guarding or rebound. Extremities: Without cyanosis, clubbing,  edema, or obvious deformity. Vascular:  Dorsalis pedis and posterior tibial pulses palpable bilaterally. Skin:  Warm and dry, no erythema, no ulcerations.   The results of significant diagnostics from this hospitalization (including imaging, microbiology, ancillary and laboratory) are listed below for reference.     Microbiology: Recent Results (from the past 240 hour(s))  SARS CORONAVIRUS 2 Nasal Swab Aptima Multi Swab     Status: None   Collection Time: 07/22/19  8:03 PM   Specimen: Aptima Multi Swab; Nasal Swab  Result Value Ref Range Status   SARS Coronavirus 2 NEGATIVE NEGATIVE Final    Comment: (NOTE) SARS-CoV-2 target nucleic acids are NOT DETECTED. The SARS-CoV-2 RNA is generally detectable in upper and lower respiratory specimens during the acute phase of infection. Negative results do not preclude SARS-CoV-2 infection, do not rule out co-infections with other pathogens, and should not be used as the sole basis for treatment or other patient management decisions. Negative results must be combined with clinical observations, patient history, and epidemiological information. The expected result is Negative. Fact Sheet for Patients: SugarRoll.be Fact Sheet for Healthcare Providers: https://www.woods-mathews.com/ This test is not yet approved or cleared by the [place] FDA and  has been authorized for detection and/or diagnosis of SARS-CoV-2 by FDA under an Emergency Use Authorization (EUA). This EUA will remain  in effect (meaning this  test can be used) for the duration of the COVID-19 declaration under Section 56 4(b)(1) of the Act, 21 U.S.C. section 360bbb-3(b)(1), unless the authorization is terminated or revoked sooner. Performed at [facility], [place] [address], [place], [place] [address]   Culture, blood (Routine X 2) w Reflex to ID Panel     Status: None (Preliminary result)   Collection Time: 07/24/19 12:26 AM    Specimen: BLOOD  Result Value Ref Range Status   Specimen Description   Final    BLOOD RIGHT HAND Performed at [facility] [address] [place], [place] [address]    Special Requests   Final    BOTTLES DRAWN AEROBIC ONLY Blood Culture adequate volume Performed at [facility] [address], [place], [place] [address]    Culture   Final    NO GROWTH 4 DAYS Performed at [facility], [place] [address], [place], [place] [address]    Report Status PENDING  Incomplete  Culture, blood (Routine X 2) w Reflex to ID Panel     Status: None (Preliminary result)   Collection Time: 07/24/19 12:26 AM   Specimen: BLOOD  Result Value Ref Range Status   Specimen Description   Final    BLOOD BLOOD RIGHT HAND Performed at [facility] [address] [place], [place] [address]    Special Requests   Final    BOTTLES DRAWN AEROBIC ONLY Blood Culture adequate volume   Culture   Final    NO GROWTH 4 DAYS Performed at [facility], [place] [address], [place], [place] [address]    Report Status PENDING  Incomplete  Surgical pcr screen     Status: Abnormal   Collection Time: 07/25/19  2:41 PM   Specimen: Nasal Mucosa; Nasal Swab  Result Value Ref Range Status   MRSA, PCR POSITIVE (A) NEGATIVE Final    Comment: RESULT CALLED TO, READ BACK BY AND VERIFIED WITH: BAUMGARDNER,C. RN @1839  ON 08.26.2020 BY COHEN,K    Staphylococcus aureus POSITIVE (A) NEGATIVE Final    Comment: (NOTE) The Xpert SA Assay (FDA approved for NASAL specimens in patients 66 years of age and older), is one component of a comprehensive surveillance program. It is not intended to diagnose infection nor to guide or monitor treatment. Performed at [facility], [place] [address], [place], [place] [address]   Aerobic/Anaerobic Culture (surgical/deep wound)     Status: None (Preliminary result)   Collection Time: 07/25/19  4:44 PM   Specimen:  Abscess  Result Value Ref Range Status   Specimen Description ABSCESS  Final   Special Requests SAMPLE A LEFT NECK  Final   Gram Stain   Final    FEW WBC PRESENT, PREDOMINANTLY PMN RARE GRAM POSITIVE COCCI Performed at [facility], [place] [address], [place], [place] [address]    Culture   Final    FEW METHICILLIN RESISTANT STAPHYLOCOCCUS AUREUS NO ANAEROBES ISOLATED; CULTURE IN PROGRESS FOR 5 DAYS    Report Status PENDING  Incomplete   Organism ID, Bacteria METHICILLIN RESISTANT STAPHYLOCOCCUS AUREUS  Final      Susceptibility   Methicillin resistant staphylococcus aureus - MIC*    CIPROFLOXACIN >=8 RESISTANT Resistant     ERYTHROMYCIN >=8 RESISTANT Resistant     GENTAMICIN <=0.5 SENSITIVE Sensitive     OXACILLIN >=4 RESISTANT Resistant     TETRACYCLINE <=1 SENSITIVE Sensitive     VANCOMYCIN 1 SENSITIVE Sensitive  TRIMETH/SULFA <=10 SENSITIVE Sensitive     CLINDAMYCIN >=8 RESISTANT Resistant     RIFAMPIN <=0.5 SENSITIVE Sensitive     Inducible Clindamycin NEGATIVE Sensitive     * FEW METHICILLIN RESISTANT STAPHYLOCOCCUS AUREUS     Labs: BNP (last 3 results) No results for input(s): BNP in the last 8760 hours. Basic Metabolic Panel: Recent Labs  Lab 07/22/19 1700 07/23/19 0607 07/24/19 1050 07/26/19 0327 07/27/19 0611 07/28/19 0740  NA 137 138  --  139 142 140  K 3.8 3.2*  --  3.6 3.8 3.5  CL 97* 100  --  102 106 103  CO2 28 25  --  26 27 27   GLUCOSE 112* 119*  --  107* 105* 105*  BUN 7 7  --  5* 5* 7  CREATININE 0.62 0.61 0.75 0.65 0.67 0.70  CALCIUM 8.9 8.6*  --  8.8* 8.8* 9.0   Liver Function Tests: Recent Labs  Lab 07/22/19 1700 07/26/19 0327  AST 10* 12*  ALT 12 10  ALKPHOS 74 58  BILITOT 0.5 0.8  PROT 8.1 6.9  ALBUMIN 3.6 2.6*   No results for input(s): LIPASE, AMYLASE in the last 168 hours. No results for input(s): AMMONIA in the last 168 hours. CBC: Recent Labs  Lab 07/22/19 1700 07/23/19 0607 07/24/19 1050 07/25/19 0322  07/26/19 0327 07/27/19 0611  WBC 10.4 9.4 11.0* 10.3 7.7 5.1  NEUTROABS 7.8*  --   --   --   --   --   HGB 11.7* 11.1* 10.7* 9.7* 10.8* 10.7*  HCT 35.9* 33.9* 33.0* 29.8* 32.9* 33.1*  MCV 84.3 83.9 85.9 85.4 83.3 85.1  PLT 439* 401* 390 389 478* 514*   Urinalysis    Component Value Date/Time   COLORURINE AMBER (A) 10/16/2014 1955   APPEARANCEUR CLEAR 10/16/2014 1955   LABSPEC 1.023 10/16/2014 1955   PHURINE 7.5 10/16/2014 1955   GLUCOSEU NEGATIVE 10/16/2014 1955   HGBUR NEGATIVE 10/16/2014 1955   BILIRUBINUR NEGATIVE 10/16/2014 1955   KETONESUR NEGATIVE 10/16/2014 1955   PROTEINUR NEGATIVE 10/16/2014 1955   UROBILINOGEN 4.0 (H) 10/16/2014 1955   NITRITE NEGATIVE 10/16/2014 1955   LEUKOCYTESUR NEGATIVE 10/16/2014 1955   Microbiology Recent Results (from the past 240 hour(s))  SARS CORONAVIRUS 2 Nasal Swab Aptima Multi Swab     Status: None   Collection Time: 07/22/19  8:03 PM   Specimen: Aptima Multi Swab; Nasal Swab  Result Value Ref Range Status   SARS Coronavirus 2 NEGATIVE NEGATIVE Final    Comment: (NOTE) SARS-CoV-2 target nucleic acids are NOT DETECTED. The SARS-CoV-2 RNA is generally detectable in upper and lower respiratory specimens during the acute phase of infection. Negative results do not preclude SARS-CoV-2 infection, do not rule out co-infections with other pathogens, and should not be used as the sole basis for treatment or other patient management decisions. Negative results must be combined with clinical observations, patient history, and epidemiological information. The expected result is Negative. Fact Sheet for Patients: SugarRoll.be Fact Sheet for Healthcare Providers: https://www.woods-mathews.com/ This test is not yet approved or cleared by the [place] FDA and  has been authorized for detection and/or diagnosis of SARS-CoV-2 by FDA under an Emergency Use Authorization (EUA). This EUA will remain  in  effect (meaning this test can be used) for the duration of the COVID-19 declaration under Section 56 4(b)(1) of the Act, 21 U.S.C. section 360bbb-3(b)(1), unless the authorization is terminated or revoked sooner. Performed at [facility], [place] [address]  [place], [place] [address]   Culture, blood (Routine X 2) w Reflex to ID Panel     Status: None (Preliminary result)   Collection Time: 07/24/19 12:26 AM   Specimen: BLOOD  Result Value Ref Range Status   Specimen Description   Final    BLOOD RIGHT HAND Performed at [facility] [address] [place], [place] [address]    Special Requests   Final    BOTTLES DRAWN AEROBIC ONLY Blood Culture adequate volume Performed at [facility] [address] [place], [place] [address]    Culture   Final    NO GROWTH 4 DAYS Performed at [facility], [place] [address], [place], [place] [address]    Report Status PENDING  Incomplete  Culture, blood (Routine X 2) w Reflex to ID Panel     Status: None (Preliminary result)   Collection Time: 07/24/19 12:26 AM   Specimen: BLOOD  Result Value Ref Range Status   Specimen Description   Final    BLOOD BLOOD RIGHT HAND Performed at [facility] [address], [place], [place] [address]    Special Requests   Final    BOTTLES DRAWN AEROBIC ONLY Blood Culture adequate volume   Culture   Final    NO GROWTH 4 DAYS Performed at [facility], [place] [address] [place], [place] [address]    Report Status PENDING  Incomplete  Surgical pcr screen     Status: Abnormal   Collection Time: 07/25/19  2:41 PM   Specimen: Nasal Mucosa; Nasal Swab  Result Value Ref Range Status   MRSA, PCR POSITIVE (A) NEGATIVE Final    Comment: RESULT CALLED TO, READ BACK BY AND VERIFIED WITH: BAUMGARDNER,C. RN @1839  ON 08.26.2020 BY COHEN,K    Staphylococcus aureus POSITIVE (A) NEGATIVE Final    Comment: (NOTE) The Xpert SA Assay (FDA approved  for NASAL specimens in patients 81 years of age and older), is one component of a comprehensive surveillance program. It is not intended to diagnose infection nor to guide or monitor treatment. Performed at [facility], [place] [address], [place], [place] [address]   Aerobic/Anaerobic Culture (surgical/deep wound)     Status: None (Preliminary result)   Collection Time: 07/25/19  4:44 PM   Specimen: Abscess  Result Value Ref Range Status   Specimen Description ABSCESS  Final   Special Requests SAMPLE A LEFT NECK  Final   Gram Stain   Final    FEW WBC PRESENT, PREDOMINANTLY PMN RARE GRAM POSITIVE COCCI Performed at [facility], [place] [address], [place], [place] [address]    Culture   Final    FEW METHICILLIN RESISTANT STAPHYLOCOCCUS AUREUS NO ANAEROBES ISOLATED; CULTURE IN PROGRESS FOR 5 DAYS    Report Status PENDING  Incomplete   Organism ID, Bacteria METHICILLIN RESISTANT STAPHYLOCOCCUS AUREUS  Final      Susceptibility   Methicillin resistant staphylococcus aureus - MIC*    CIPROFLOXACIN >=8 RESISTANT Resistant     ERYTHROMYCIN >=8 RESISTANT Resistant     GENTAMICIN <=0.5 SENSITIVE Sensitive     OXACILLIN >=4 RESISTANT Resistant     TETRACYCLINE <=1 SENSITIVE Sensitive     VANCOMYCIN 1 SENSITIVE Sensitive     TRIMETH/SULFA <=10 SENSITIVE Sensitive     CLINDAMYCIN >=8 RESISTANT Resistant     RIFAMPIN <=0.5 SENSITIVE Sensitive     Inducible Clindamycin NEGATIVE Sensitive     * FEW METHICILLIN RESISTANT STAPHYLOCOCCUS  AUREUS     Time coordinating discharge: Over 30 minutes  SIGNED:  Little Ishikawa, DO [facility] 07/28/2019, 2:26 PM Pager   If 7PM-7AM, please contact night-coverage [email or web address] Password [email or web address]

## 2019-07-28 NOTE — Plan of Care (Signed)

## 2019-07-28 NOTE — Progress Notes (Signed)
[name] to be D/C'd  per MD order. Discussed with the patient and all questions fully answered.  VSS, Skin clean, dry and intact without evidence of skin break down, no evidence of skin tears noted.  IV catheter discontinued intact. Site without signs and symptoms of complications. Dressing and pressure applied.  An After Visit Summary was printed and given to the patient. Patient received prescriptions.  D/c education completed with patient/family including follow up instructions, medication list, d/c activities limitations if indicated, with other d/c instructions as indicated by MD - patient able to verbalize understanding, all questions fully answered.   Patient instructed to return to ED, call 911, or call MD for any changes in condition.   Patient to be escorted via [place], and D/C home via private auto.

## 2019-07-29 LAB — CULTURE, BLOOD (ROUTINE X 2)
Culture: NO GROWTH
Culture: NO GROWTH
Special Requests: ADEQUATE
Special Requests: ADEQUATE

## 2019-07-30 LAB — HEPATITIS C VRS RNA DETECT BY PCR-QUAL: Hepatitis C Vrs RNA by PCR-Qual: NEGATIVE

## 2019-07-30 LAB — AEROBIC/ANAEROBIC CULTURE W GRAM STAIN (SURGICAL/DEEP WOUND)

## 2019-08-08 ENCOUNTER — Other Ambulatory Visit: Payer: Self-pay

## 2019-08-08 ENCOUNTER — Ambulatory Visit (INDEPENDENT_AMBULATORY_CARE_PROVIDER_SITE_OTHER): Payer: Medicaid Other | Admitting: Internal Medicine

## 2019-08-08 ENCOUNTER — Encounter: Payer: Self-pay | Admitting: Internal Medicine

## 2019-08-08 DIAGNOSIS — B182 Chronic viral hepatitis C: Secondary | ICD-10-CM | POA: Diagnosis not present

## 2019-08-08 DIAGNOSIS — F1910 Other psychoactive substance abuse, uncomplicated: Secondary | ICD-10-CM | POA: Diagnosis not present

## 2019-08-08 DIAGNOSIS — L0211 Cutaneous abscess of neck: Secondary | ICD-10-CM

## 2019-08-08 NOTE — Progress Notes (Signed)
[facility]  Patient Active Problem List   Diagnosis Date Noted  . Neck abscess 02/07/2019    Priority: High  . Hepatitis C 10/17/2014    Priority: Medium  . IV drug abuse ([place]) 10/16/2014    Priority: Medium  . 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 Thrombocytosis ([place]) 07/22/2019  . Anemia 07/22/2019  . Acute foreign body of left upper arm 10/17/2014  . Tobacco abuse 10/17/2014  . Cellulitis 10/16/2014  . Cellulitis of forearm, left 10/16/2014    Patient's Medications  New Prescriptions   No medications on file  Previous Medications   ASPIRIN-SALICYLAMIDE-CAFFEINE (BC HEADACHE POWDER PO)    Take 1 packet by mouth every 6 (six) hours as needed (headache).   ELIQUIS DVT/PE STARTER PACK (ELIQUIS STARTER PACK) 5 MG TABS    Take as directed on package: start with two-5mg  tablets twice daily for 7 days. On day 8, switch to one-5mg  tablet twice daily.   IBUPROFEN (ADVIL,MOTRIN) 200 MG TABLET    Take 800 mg by mouth every 6 (six) hours as needed for headache.   SUBOXONE 8-2 MG FILM    Place 1 Film under the tongue 2 (two) times daily.  Modified Medications   No medications on file  Discontinued Medications   LINEZOLID (ZYVOX) 600 MG TABLET    Take 1 tablet (600 mg total) by mouth 2 (two) times daily.    Subjective: [name] is in for his hospital follow-up visit.  He has a history of polysubstance abuse, addiction and injecting drug use.  He has had recurrent MRSA infections and was admitted to the hospital late last month with a left neck abscess and internal jugular vein thrombosis.  Blood cultures were negative.  Chest CT did not show any evidence of septic emboli.  He was discharged on oral linezolid following incision and drainage of his abscess.  He has tolerated it well and has not missed any doses.  He is now completed 2-1/2 weeks of total antibiotic therapy.  He has been taking Suboxone from a drug treatment clinic and has  not used any street drugs since before his hospitalization.  Review of Systems: Review of Systems  Constitutional: Negative for chills, diaphoresis and fever.  Respiratory: Negative for cough, sputum production and shortness of breath.   Cardiovascular: Negative for chest pain.  Gastrointestinal: Negative for abdominal pain, diarrhea, nausea and vomiting.  Psychiatric/Behavioral: Negative for depression and substance abuse. The patient is nervous/anxious.     Past Medical History:  Diagnosis Date  . Hepatitis C   . History of Stevens-Johnson toxic epidermal necrolysis overlap syndrome    with PCN, not with keflex  . Opiate addiction ([place])    on methadone    Social History   Tobacco Use  . Smoking status: Current Every Day Smoker    Packs/day: 0.00    Years: 5.00    Pack years: 0.00  . Smokeless tobacco: Never Used  Substance Use Topics  . Alcohol use: No  . Drug use: Yes    Types: Marijuana    Family History  Problem Relation Age of Onset  . Diabetes Other        grandparents  . Stroke Other   . Pancreatic cancer Other   . Diabetes Mellitus II Other     Allergies  Allergen Reactions  . Amoxicillin     Steven-Johnson's Syndrome  . Ceclor [Cefaclor] Other (See Comments)  Steven-Johnson's Syndrome  . Erythromycin     Steven-Johnson's Syndrome  . Penicillins     Steven-Johnson's Syndrome  . Septra [Sulfamethoxazole-Trimethoprim]     Steven-Johnson's Syndrome  . Tylenol [Acetaminophen]     Objective: Vitals:   08/08/19 1019  BP: 123/72  Pulse: 88  Temp: 98.4 F (36.9 C)  TempSrc: Oral   There is no height or weight on file to calculate BMI.  Physical Exam Constitutional:      Comments: He is pleasant and in no distress.  Neck:     Comments: His left neck incision is healing nicely.  There is no drainage, erythema or fluctuance. Cardiovascular:     Rate and Rhythm: Normal rate and regular rhythm.     Heart sounds: No murmur.  Pulmonary:      Effort: Pulmonary effort is normal.     Breath sounds: Normal breath sounds.  Abdominal:     Palpations: Abdomen is soft.     Tenderness: There is no abdominal tenderness.  Psychiatric:        Mood and Affect: Mood normal.     Lab Results    Problem List Items Addressed This Visit      High   Neck abscess    I believe that his neck abscess has been cured through a combination of surgical drainage and 2 and half weeks of antibiotics.  I instructed him to stop linezolid now.  He can follow-up here as needed.  He remains on anticoagulation for his internal jugular vein thrombosis.        Medium   IV drug abuse ([place])    I congratulated him on being drug-free since discharge and encouraged him to continue Suboxone and attendance at the drug treatment clinic.      Hepatitis C (Chronic)    He has hepatitis C antibody positive but his hepatitis C viral load is undetectable.  He was able to spontaneously clear his infection.  He does not need any further testing or treatment but I did tell him that if he continues to inject drugs he can be reinfected.          Michel Bickers, MD [facility] [phone] pager   [phone] cell 08/08/2019, 11:05 AM

## 2019-08-08 NOTE — Assessment & Plan Note (Signed)
I congratulated him on being drug-free since discharge and encouraged him to continue Suboxone and attendance at the drug treatment clinic.

## 2019-08-08 NOTE — Assessment & Plan Note (Signed)
He has hepatitis C antibody positive but his hepatitis C viral load is undetectable.  He was able to spontaneously clear his infection.  He does not need any further testing or treatment but I did tell him that if he continues to inject drugs he can be reinfected.

## 2019-08-08 NOTE — Assessment & Plan Note (Signed)
I believe that his neck abscess has been cured through a combination of surgical drainage and 2 and half weeks of antibiotics.  I instructed him to stop linezolid now.  He can follow-up here as needed.  He remains on anticoagulation for his internal jugular vein thrombosis.

## 2019-08-29 ENCOUNTER — Other Ambulatory Visit: Payer: Self-pay

## 2019-08-29 ENCOUNTER — Encounter: Payer: Self-pay | Admitting: Neurology

## 2019-08-29 ENCOUNTER — Ambulatory Visit: Payer: Medicaid Other | Admitting: Neurology

## 2019-08-29 VITALS — BP 130/75 | HR 97 | Ht 66.0 in | Wt 164.4 lb

## 2019-08-29 DIAGNOSIS — R569 Unspecified convulsions: Secondary | ICD-10-CM | POA: Diagnosis not present

## 2019-08-29 DIAGNOSIS — G43019 Migraine without aura, intractable, without status migrainosus: Secondary | ICD-10-CM

## 2019-08-29 MED ORDER — LEVETIRACETAM 500 MG PO TABS: 500.0000 mg | ORAL_TABLET | Freq: Two times a day (BID) | ORAL | 3 refills | Status: DC

## 2019-08-29 MED ORDER — TOPIRAMATE 25 MG PO TABS: ORAL_TABLET | ORAL | 5 refills | Status: DC
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
## 2019-09-03 ENCOUNTER — Other Ambulatory Visit: Payer: Medicaid Other

## 2019-09-05 ENCOUNTER — Ambulatory Visit (INDEPENDENT_AMBULATORY_CARE_PROVIDER_SITE_OTHER): Payer: Medicaid Other | Admitting: Neurology

## 2019-09-05 ENCOUNTER — Other Ambulatory Visit: Payer: Self-pay

## 2019-09-05 DIAGNOSIS — R569 Unspecified convulsions: Secondary | ICD-10-CM

## 2019-09-10 NOTE — Procedures (Signed)
ELECTROENCEPHALOGRAM REPORT  Date of Study: 09/05/2019  Patient's Name: [name] MRN: [id number] Date of Birth: Apr 11, 1991  Referring Provider: Dr. Ellouise Newer  Clinical History: This is a 28 year old man with recurrent episodes of convulsions preceded by staring and body jerks. He also reports olfactory hallucinations. EEG for classification.  Medications: KEPPRA 500 MG tablet TOPAMAX ELIQUIS STARTER PACK 5 MG TABS ADVIL,MOTRIN 200 MG tablet SUBOXONE 8-2 MG FILM  Technical Summary: A multichannel digital 1-hour sleep-deprived EEG recording measured by the international 10-20 system with electrodes applied with paste and impedances below 5000 ohms performed in our laboratory with EKG monitoring in an awake and asleep patient.  Hyperventilation was not performed. Photic stimulation was performed.  The digital EEG was referentially recorded, reformatted, and digitally filtered in a variety of bipolar and referential montages for optimal display.    Description: The patient is awake and asleep during the recording.  During maximal wakefulness, there is a symmetric, medium voltage 8-8.5 Hz posterior dominant rhythm that attenuates with eye opening.  The record is symmetric.  During drowsiness and sleep, there is an increase in theta slowing of the background.  Vertex waves and symmetric sleep spindles were seen.  Photic stimulation did not elicit any abnormalities.  There were no epileptiform discharges or electrographic seizures seen.    EKG lead was unremarkable.  Impression: This 1-hour awake and asleep EEG is normal.    Clinical Correlation: A normal EEG does not exclude a clinical diagnosis of epilepsy.  If further clinical questions remain, prolonged EEG may be helpful.  Clinical correlation is advised.   Ellouise Newer, M.D.

## 2019-09-11 ENCOUNTER — Telehealth: Payer: Self-pay

## 2019-09-11 NOTE — Telephone Encounter (Signed)
Left message informing pt of one hour EEG results and to keep MRI appt. Also to continue taking Keppra and Topamax as prescribed.

## 2019-09-11 NOTE — Telephone Encounter (Signed)
-----   Message from Cameron Sprang, MD sent at 09/10/2019  2:43 PM EDT ----- Pls let him know the brain wave test was normal. It is not like a pregnancy test that is positive or negative, it is only a snapshot of his brain waves for the 1-hour we do the test. Proceed with MRI brain. Continue with Keppra and Topamax as prescribed. Thanks

## 2019-09-23 ENCOUNTER — Ambulatory Visit
Admission: RE | Admit: 2019-09-23 | Discharge: 2019-09-23 | Disposition: A | Discharge status: Home / Self Care | Payer: Medicaid Other | Source: Ambulatory Visit | Attending: Neurology | Admitting: Neurology

## 2019-09-23 ENCOUNTER — Other Ambulatory Visit: Payer: Self-pay

## 2019-09-23 DIAGNOSIS — R569 Unspecified convulsions: Secondary | ICD-10-CM

## 2019-09-23 MED ORDER — GADOBENATE DIMEGLUMINE 529 MG/ML IV SOLN
15.0000 mL | Freq: Once | INTRAVENOUS | Status: AC | PRN
  Administered 2019-09-23: 15 mL via INTRAVENOUS

## 2019-09-25 ENCOUNTER — Telehealth: Payer: Self-pay

## 2019-09-25 NOTE — Telephone Encounter (Signed)
Pt informed of MRI Brain results. No concerns at this time.

## 2019-09-25 NOTE — Telephone Encounter (Signed)
-----   Message from Cameron Sprang, MD sent at 09/25/2019  8:38 AM EDT ----- Pls let him know the MRI brain was normal, no evidence of tumor, stroke, bleed, or scar tissue. Thanks

## 2019-12-03 ENCOUNTER — Other Ambulatory Visit: Payer: Self-pay

## 2019-12-03 ENCOUNTER — Telehealth (INDEPENDENT_AMBULATORY_CARE_PROVIDER_SITE_OTHER): Payer: Medicaid Other | Admitting: Neurology

## 2019-12-03 ENCOUNTER — Encounter: Payer: Self-pay | Admitting: Neurology

## 2019-12-03 VITALS — Ht 66.0 in | Wt 183.0 lb

## 2019-12-03 DIAGNOSIS — R569 Unspecified convulsions: Secondary | ICD-10-CM

## 2019-12-03 DIAGNOSIS — G43019 Migraine without aura, intractable, without status migrainosus: Secondary | ICD-10-CM

## 2019-12-03 MED ORDER — TOPIRAMATE 100 MG PO TABS: 100.0000 mg | ORAL_TABLET | Freq: Two times a day (BID) | ORAL | 3 refills | Status: DC

## 2019-12-03 MED ORDER — LEVETIRACETAM 500 MG PO TABS: 500.0000 mg | ORAL_TABLET | Freq: Two times a day (BID) | ORAL | 3 refills | Status: DC
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
## 2020-04-01 ENCOUNTER — Ambulatory Visit: Payer: Medicaid Other | Admitting: Neurology

## 2020-08-21 ENCOUNTER — Ambulatory Visit: Payer: Medicaid Other | Admitting: Neurology

## 2020-12-25 ENCOUNTER — Other Ambulatory Visit: Payer: Self-pay | Admitting: Neurology

## 2020-12-30 ENCOUNTER — Other Ambulatory Visit: Payer: Self-pay | Admitting: Neurology

## 2020-12-31 ENCOUNTER — Other Ambulatory Visit: Payer: Self-pay

## 2020-12-31 ENCOUNTER — Telehealth: Payer: Self-pay | Admitting: Neurology

## 2020-12-31 MED ORDER — TOPIRAMATE 100 MG PO TABS: 100.0000 mg | ORAL_TABLET | Freq: Two times a day (BID) | ORAL | 3 refills | Status: DC

## 2020-12-31 MED ORDER — LEVETIRACETAM 500 MG PO TABS: 500.0000 mg | ORAL_TABLET | Freq: Two times a day (BID) | ORAL | 3 refills | Status: DC

## 2020-12-31 NOTE — Telephone Encounter (Signed)
Patient's significant other called to schedule a follow up with Dr Delice Lesch, he is sch for 09/04/21 and was placed on waitlist. He is needing a refill of Keppra and Topamax sent to [facility]. She didn't know if Dr Delice Lesch could send it in for him since he has a follow up scheduled.

## 2021-01-02 IMAGING — CT CT CHEST WITH CONTRAST
2 of 3 series · 15 of 36 positions shown, 18 images · IV contrast (OMNIPAQUE)
Comparison: CT scan of July 22, 2019.

CLINICAL DATA: Cellulitis of left chest.

EXAM:
CT CHEST WITH CONTRAST
TECHNIQUE: Multidetector CT imaging of the chest was performed during
intravenous contrast administration.
CONTRAST:  75mL OMNIPAQUE IOHEXOL 300 MG/ML  SOLN

[Series 3: axial st · axial · 0.71mm/px · z∈[-175,+101]mm · 12 of 163 slices shown, 15 images]
[im 13/163  mediastinal]
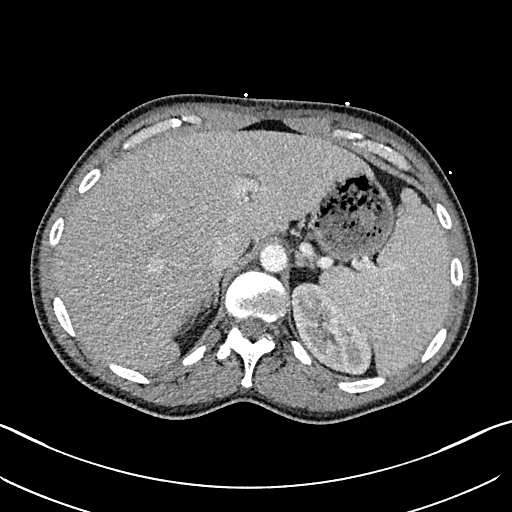
[im 13/163  lung]
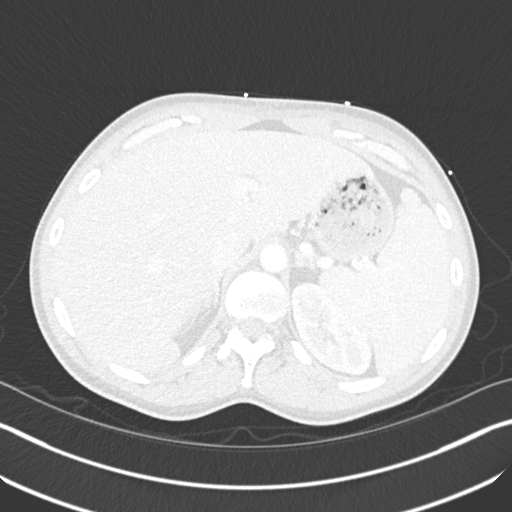
[im 25/163  lung]
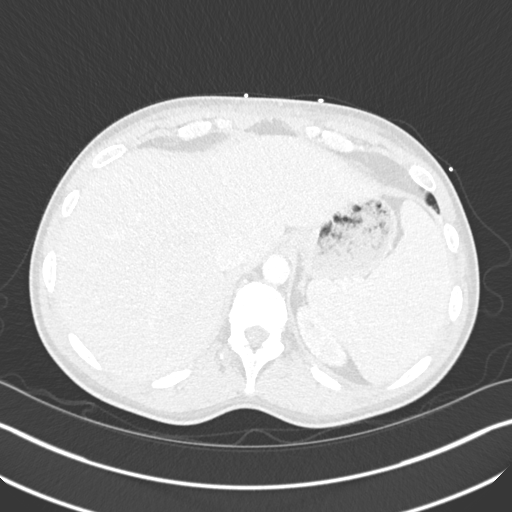
[im 37/163  lung]
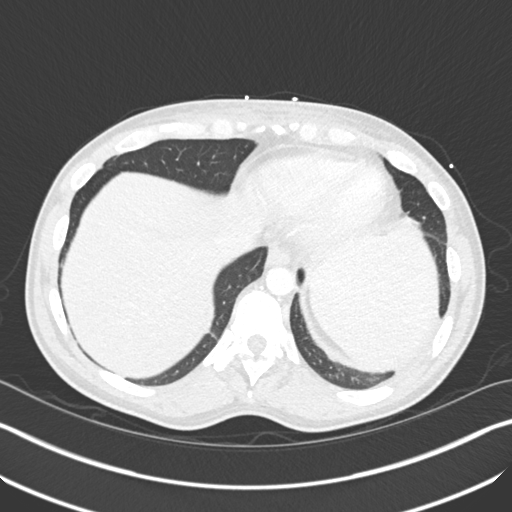
[im 49/163  lung]
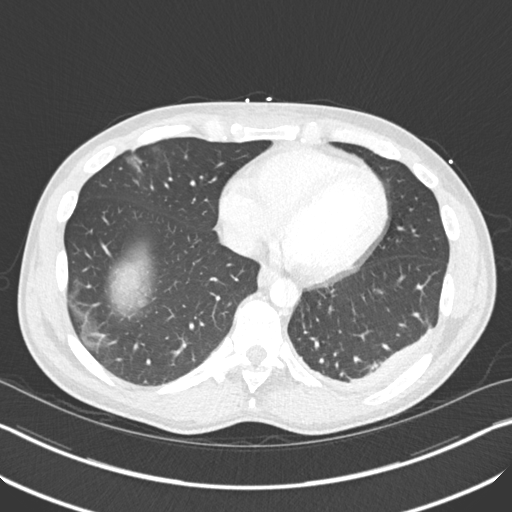
[im 61/163  mediastinal]
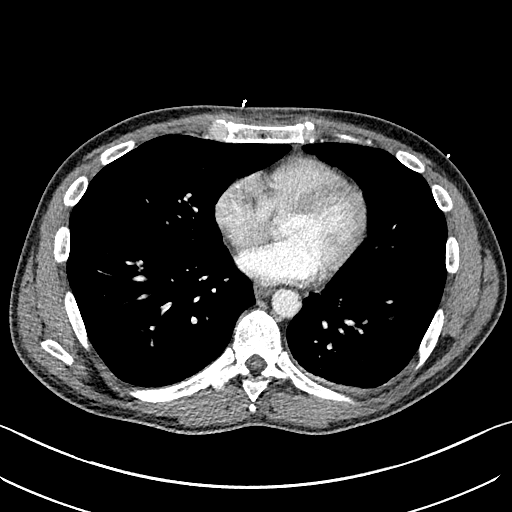
[im 61/163  lung]
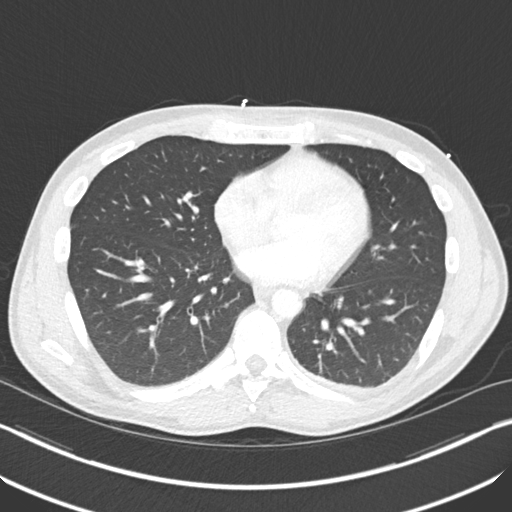
[im 73/163  lung]
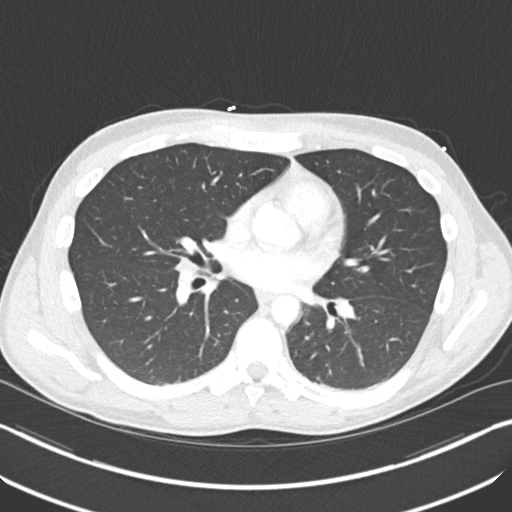
[im 91/163  lung]
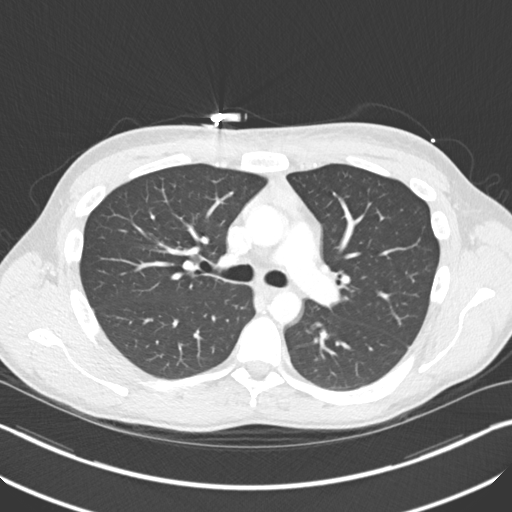
[im 103/163  lung]
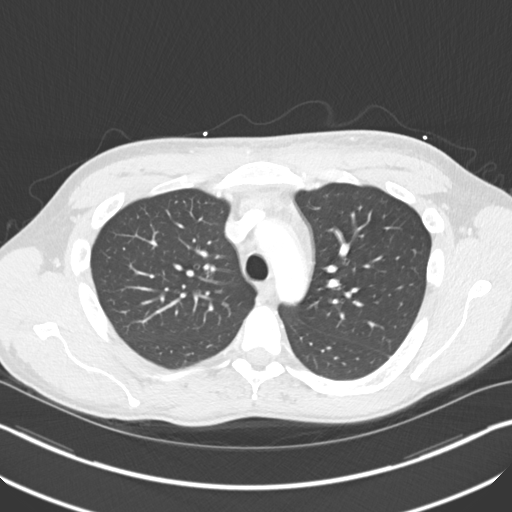
[im 115/163  mediastinal]
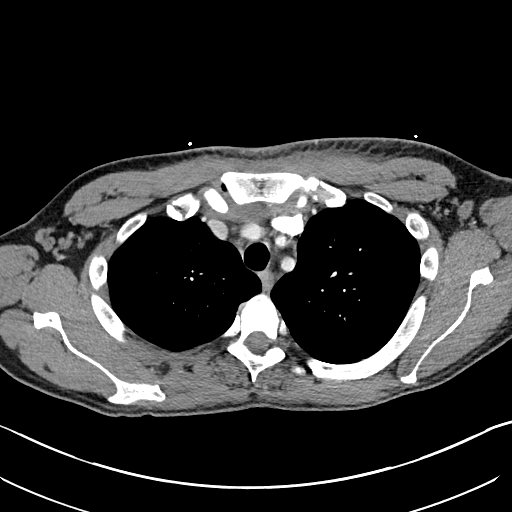
[im 115/163  lung]
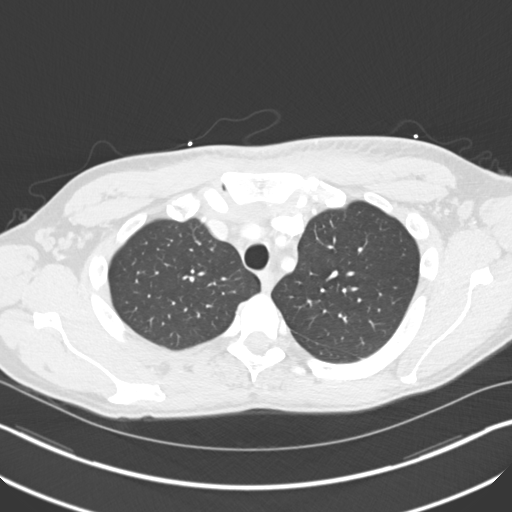
[im 127/163  lung]
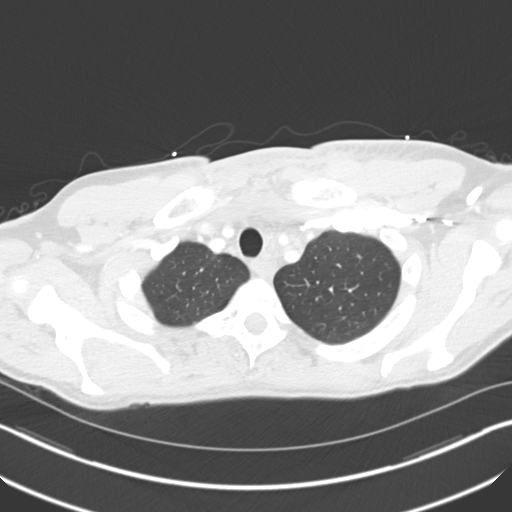
[im 139/163  lung]
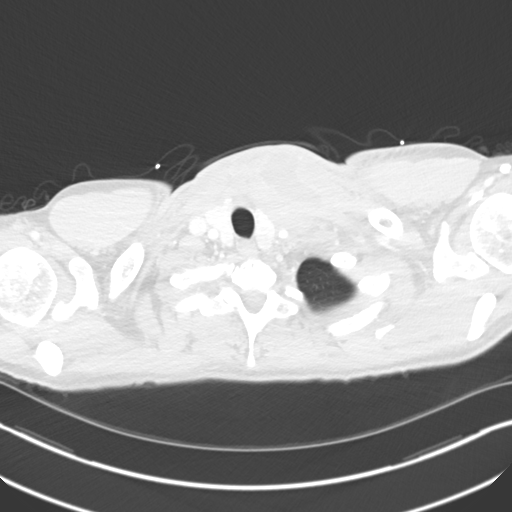
[im 151/163  lung]
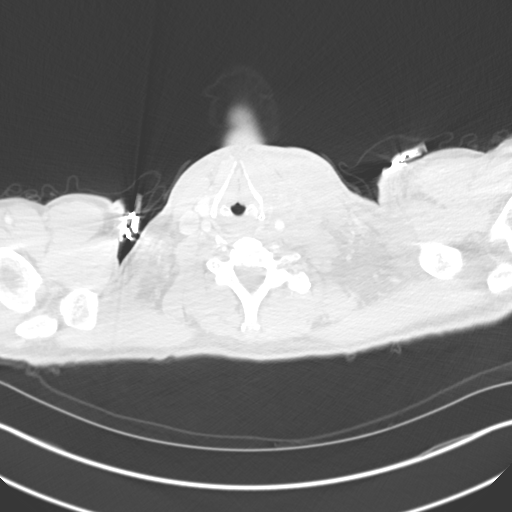

[Series 5: coronal · coronal · 0.65mm/px · 3 of 109 slices shown]
[im 22/109  lung]
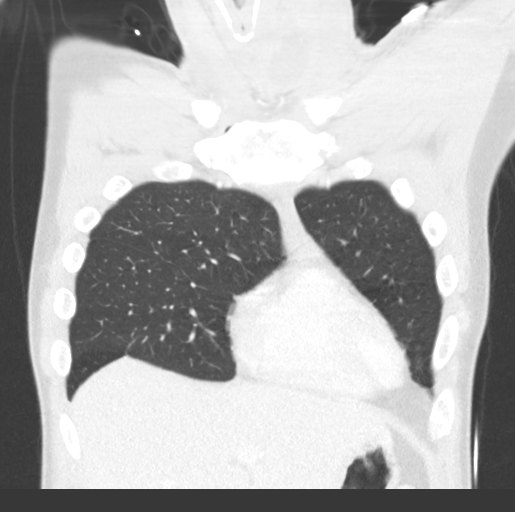
[im 44/109  lung]
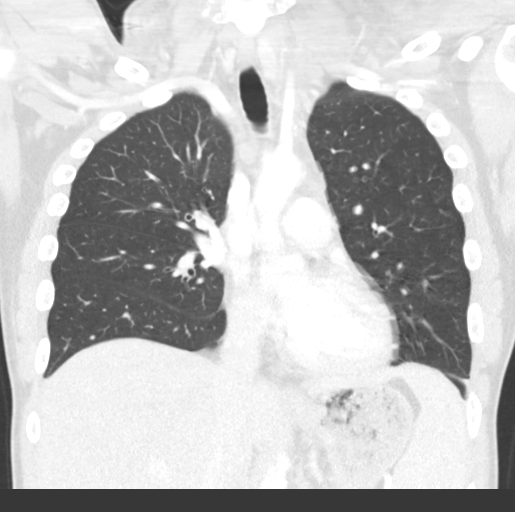
[im 65/109  lung]
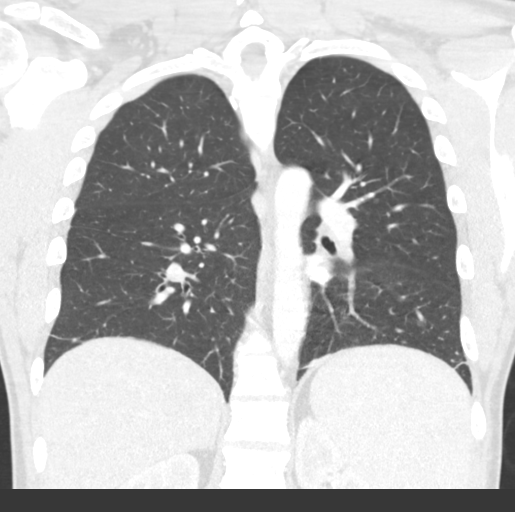

[15 of 36 positions shown; findings below may reference images not displayed]

FINDINGS: Cardiovascular: There is again noted thrombosis of the left internal
jugular vein. There is no evidence of thoracic aortic dissection or
aneurysm. Normal cardiac size. No pericardial effusion.

Mediastinum/Nodes: The esophagus and thyroid gland are unremarkable.
There is a large area of abnormal soft tissue density seen in the
left cervical and supraclavicular region which is displacing the
thyroid gland and trachea to the right slightly. Fluid collection
measuring 5.6 x 3.1 cm is noted within this area consistent with
abscess which is significantly enlarged compared to prior exam. Some
inflammatory changes are seen extending into the retrosternal space.
12 mm right paratracheal lymph node is noted which most likely is
inflammatory in etiology.

Lungs/Pleura: No pneumothorax or pleural effusion is noted. Right
lung is clear. Left posterior basilar subsegmental atelectasis or
inflammation is noted.

Upper Abdomen: No acute abnormality.

Musculoskeletal: No chest wall abnormality. No acute or significant
osseous findings.
IMPRESSION: Left cervical abscess noted on prior exam is significantly enlarged
currently, measuring 5.6 x 3.1 cm. There remains a large amount of
inflammatory material as well which may be enlarged compared to
prior exam. This causes some displacement of the thyroid gland and
trachea to the right. Some extension of inflammatory changes are
seen extending into the superior portion of the retrosternal space.
Right paratracheal lymph node is noted which most likely is
inflammatory in etiology.

Stable thrombosis of left internal jugular vein is noted.

Left posterior basilar opacity is noted concerning for subsegmental
atelectasis or possibly infiltrate.

## 2021-04-29 ENCOUNTER — Ambulatory Visit (INDEPENDENT_AMBULATORY_CARE_PROVIDER_SITE_OTHER): Payer: Medicaid Other | Admitting: Neurology

## 2021-04-29 ENCOUNTER — Encounter: Payer: Self-pay | Admitting: Neurology

## 2021-04-29 ENCOUNTER — Other Ambulatory Visit: Payer: Self-pay

## 2021-04-29 VITALS — BP 126/77 | HR 117 | Ht 66.0 in | Wt 167.8 lb

## 2021-04-29 DIAGNOSIS — G43019 Migraine without aura, intractable, without status migrainosus: Secondary | ICD-10-CM

## 2021-04-29 DIAGNOSIS — R569 Unspecified convulsions: Secondary | ICD-10-CM

## 2021-04-29 MED ORDER — TOPIRAMATE 100 MG PO TABS: 100.0000 mg | ORAL_TABLET | Freq: Two times a day (BID) | ORAL | 3 refills | Status: DC

## 2021-04-29 MED ORDER — LEVETIRACETAM 500 MG PO TABS: ORAL_TABLET | ORAL | 3 refills | Status: DC

## 2021-04-29 MED ORDER — CYCLOBENZAPRINE HCL 5 MG PO TABS: ORAL_TABLET | ORAL | 11 refills | Status: DC
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
## 2021-04-30 ENCOUNTER — Encounter: Payer: Self-pay | Admitting: Neurology

## 2021-09-04 ENCOUNTER — Ambulatory Visit: Payer: Medicaid Other | Admitting: Neurology

## 2021-10-11 ENCOUNTER — Inpatient Hospital Stay (HOSPITAL_COMMUNITY)
Admission: EM | Admit: 2021-10-11 | Discharge: 2021-10-19 | DRG: 602 | Disposition: A | Discharge status: Home / Self Care | Payer: Medicaid Other | Attending: Internal Medicine | Admitting: Internal Medicine

## 2021-10-11 ENCOUNTER — Emergency Department (HOSPITAL_COMMUNITY): Payer: Medicaid Other

## 2021-10-11 ENCOUNTER — Encounter (HOSPITAL_COMMUNITY): Payer: Self-pay | Admitting: Emergency Medicine

## 2021-10-11 DIAGNOSIS — L0211 Cutaneous abscess of neck: Secondary | ICD-10-CM | POA: Diagnosis present

## 2021-10-11 DIAGNOSIS — Z880 Allergy status to penicillin: Secondary | ICD-10-CM

## 2021-10-11 DIAGNOSIS — Z823 Family history of stroke: Secondary | ICD-10-CM

## 2021-10-11 DIAGNOSIS — F1121 Opioid dependence, in remission: Secondary | ICD-10-CM

## 2021-10-11 DIAGNOSIS — L0390 Cellulitis, unspecified: Secondary | ICD-10-CM | POA: Diagnosis present

## 2021-10-11 DIAGNOSIS — M549 Dorsalgia, unspecified: Secondary | ICD-10-CM

## 2021-10-11 DIAGNOSIS — E876 Hypokalemia: Secondary | ICD-10-CM | POA: Diagnosis present

## 2021-10-11 DIAGNOSIS — Z20822 Contact with and (suspected) exposure to covid-19: Secondary | ICD-10-CM | POA: Diagnosis present

## 2021-10-11 DIAGNOSIS — Z8673 Personal history of transient ischemic attack (TIA), and cerebral infarction without residual deficits: Secondary | ICD-10-CM | POA: Diagnosis not present

## 2021-10-11 DIAGNOSIS — E871 Hypo-osmolality and hyponatremia: Secondary | ICD-10-CM | POA: Diagnosis present

## 2021-10-11 DIAGNOSIS — L0291 Cutaneous abscess, unspecified: Secondary | ICD-10-CM

## 2021-10-11 DIAGNOSIS — Z888 Allergy status to other drugs, medicaments and biological substances status: Secondary | ICD-10-CM | POA: Diagnosis not present

## 2021-10-11 DIAGNOSIS — S1095XA Superficial foreign body of unspecified part of neck, initial encounter: Secondary | ICD-10-CM

## 2021-10-11 DIAGNOSIS — M545 Low back pain, unspecified: Secondary | ICD-10-CM | POA: Diagnosis not present

## 2021-10-11 DIAGNOSIS — Z79899 Other long term (current) drug therapy: Secondary | ICD-10-CM | POA: Diagnosis not present

## 2021-10-11 DIAGNOSIS — B9562 Methicillin resistant Staphylococcus aureus infection as the cause of diseases classified elsewhere: Secondary | ICD-10-CM | POA: Diagnosis present

## 2021-10-11 DIAGNOSIS — G894 Chronic pain syndrome: Secondary | ICD-10-CM | POA: Diagnosis present

## 2021-10-11 DIAGNOSIS — I82C11 Acute embolism and thrombosis of right internal jugular vein: Secondary | ICD-10-CM | POA: Diagnosis not present

## 2021-10-11 DIAGNOSIS — I330 Acute and subacute infective endocarditis: Secondary | ICD-10-CM

## 2021-10-11 DIAGNOSIS — F1910 Other psychoactive substance abuse, uncomplicated: Secondary | ICD-10-CM | POA: Diagnosis not present

## 2021-10-11 DIAGNOSIS — F17210 Nicotine dependence, cigarettes, uncomplicated: Secondary | ICD-10-CM | POA: Diagnosis present

## 2021-10-11 DIAGNOSIS — G40909 Epilepsy, unspecified, not intractable, without status epilepticus: Secondary | ICD-10-CM

## 2021-10-11 DIAGNOSIS — I808 Phlebitis and thrombophlebitis of other sites: Principal | ICD-10-CM | POA: Diagnosis present

## 2021-10-11 DIAGNOSIS — Z833 Family history of diabetes mellitus: Secondary | ICD-10-CM | POA: Diagnosis not present

## 2021-10-11 DIAGNOSIS — Z800 Family history of malignant neoplasm of digestive organs: Secondary | ICD-10-CM | POA: Diagnosis not present

## 2021-10-11 DIAGNOSIS — L03221 Cellulitis of neck: Secondary | ICD-10-CM

## 2021-10-11 DIAGNOSIS — R9430 Abnormal result of cardiovascular function study, unspecified: Secondary | ICD-10-CM | POA: Diagnosis not present

## 2021-10-11 DIAGNOSIS — I38 Endocarditis, valve unspecified: Secondary | ICD-10-CM | POA: Diagnosis not present

## 2021-10-11 DIAGNOSIS — G8929 Other chronic pain: Secondary | ICD-10-CM | POA: Diagnosis not present

## 2021-10-11 LAB — CBC WITH DIFFERENTIAL/PLATELET
Abs Immature Granulocytes: 0.02 10*3/uL (ref 0.00–0.07)
Basophils Absolute: 0 10*3/uL (ref 0.0–0.1)
Basophils Relative: 0 %
Eosinophils Absolute: 0 10*3/uL (ref 0.0–0.5)
Eosinophils Relative: 0 %
HCT: 37.4 % — ABNORMAL LOW (ref 39.0–52.0)
Hemoglobin: 12.3 g/dL — ABNORMAL LOW (ref 13.0–17.0)
Immature Granulocytes: 0 %
Lymphocytes Relative: 16 %
Lymphs Abs: 1.5 10*3/uL (ref 0.7–4.0)
MCH: 28.3 pg (ref 26.0–34.0)
MCHC: 32.9 g/dL (ref 30.0–36.0)
MCV: 86.2 fL (ref 80.0–100.0)
Monocytes Absolute: 0.6 10*3/uL (ref 0.1–1.0)
Monocytes Relative: 6 %
Neutro Abs: 7.3 10*3/uL (ref 1.7–7.7)
Neutrophils Relative %: 78 %
Platelets: 309 10*3/uL (ref 150–400)
RBC: 4.34 MIL/uL (ref 4.22–5.81)
RDW: 12.2 % (ref 11.5–15.5)
WBC: 9.5 10*3/uL (ref 4.0–10.5)
nRBC: 0 % (ref 0.0–0.2)

## 2021-10-11 LAB — RESP PANEL BY RT-PCR (FLU A&B, COVID) ARPGX2
Influenza A by PCR: NEGATIVE
Influenza B by PCR: NEGATIVE
SARS Coronavirus 2 by RT PCR: NEGATIVE

## 2021-10-11 LAB — COMPREHENSIVE METABOLIC PANEL
ALT: 9 U/L (ref 0–44)
AST: 12 U/L — ABNORMAL LOW (ref 15–41)
Albumin: 4.1 g/dL (ref 3.5–5.0)
Alkaline Phosphatase: 68 U/L (ref 38–126)
Anion gap: 10 (ref 5–15)
BUN: 10 mg/dL (ref 6–20)
CO2: 30 mmol/L (ref 22–32)
Calcium: 9.2 mg/dL (ref 8.9–10.3)
Chloride: 97 mmol/L — ABNORMAL LOW (ref 98–111)
Creatinine, Ser: 0.62 mg/dL (ref 0.61–1.24)
GFR, Estimated: >60 mL/min (ref >60)
Glucose, Bld: 103 mg/dL — ABNORMAL HIGH (ref 70–99)
Potassium: 3.9 mmol/L (ref 3.5–5.1)
Sodium: 137 mmol/L (ref 135–145)
Total Bilirubin: 0.4 mg/dL (ref 0.3–1.2)
Total Protein: 7.8 g/dL (ref 6.5–8.1)

## 2021-10-11 LAB — LACTIC ACID, PLASMA
Lactic Acid, Venous: 0.9 mmol/L (ref 0.5–1.9)
Lactic Acid, Venous: 0.8 mmol/L (ref 0.5–1.9)

## 2021-10-11 LAB — TROPONIN I (HIGH SENSITIVITY)
Troponin I (High Sensitivity): <2 ng/L (ref <18)
Troponin I (High Sensitivity): <2 ng/L (ref <18)

## 2021-10-11 LAB — LIPASE, BLOOD: Lipase: 28 U/L (ref 11–51)

## 2021-10-11 MED ORDER — DEXAMETHASONE SODIUM PHOSPHATE 10 MG/ML IJ SOLN
8.0000 mg | Freq: Three times a day (TID) | INTRAMUSCULAR | Status: AC
  Administered 2021-10-11 – 2021-10-12 (×2): 8 mg via INTRAVENOUS
  Filled 2021-10-11: qty 0.8
  Filled 2021-10-11: qty 1

## 2021-10-11 MED ORDER — VANCOMYCIN HCL 1500 MG/300ML IV SOLN
1500.0000 mg | Freq: Two times a day (BID) | INTRAVENOUS | Status: DC
  Filled 2021-10-11: qty 300

## 2021-10-11 MED ORDER — OXYCODONE HCL 5 MG PO TABS
5.0000 mg | ORAL_TABLET | ORAL | Status: DC | PRN
  Administered 2021-10-12 (×2): 5 mg via ORAL
  Filled 2021-10-11 (×3): qty 1

## 2021-10-11 MED ORDER — HEPARIN (PORCINE) 25000 UT/250ML-% IV SOLN
1450.0000 [IU]/h | INTRAVENOUS | Status: DC
  Administered 2021-10-11 – 2021-10-13 (×3): 1400 [IU]/h via INTRAVENOUS
  Filled 2021-10-11 (×3): qty 250

## 2021-10-11 MED ORDER — ACETAMINOPHEN 325 MG PO TABS
650.0000 mg | ORAL_TABLET | Freq: Four times a day (QID) | ORAL | Status: DC | PRN
  Administered 2021-10-12: 650 mg via ORAL
  Filled 2021-10-11: qty 2

## 2021-10-11 MED ORDER — ONDANSETRON HCL 4 MG/2ML IJ SOLN
4.0000 mg | Freq: Once | INTRAMUSCULAR | Status: AC
  Administered 2021-10-11: 4 mg via INTRAVENOUS
  Filled 2021-10-11: qty 2

## 2021-10-11 MED ORDER — HYDROMORPHONE HCL 1 MG/ML IJ SOLN
0.5000 mg | INTRAMUSCULAR | Status: DC | PRN
  Administered 2021-10-11 (×2): 1 mg via INTRAVENOUS
  Filled 2021-10-11 (×2): qty 1

## 2021-10-11 MED ORDER — ONDANSETRON HCL 4 MG PO TABS: 4.0000 mg | ORAL_TABLET | Freq: Four times a day (QID) | ORAL | Status: DC | PRN

## 2021-10-11 MED ORDER — METRONIDAZOLE 500 MG/100ML IV SOLN
500.0000 mg | Freq: Two times a day (BID) | INTRAVENOUS | Status: DC
  Administered 2021-10-11 – 2021-10-13 (×4): 500 mg via INTRAVENOUS
  Filled 2021-10-11 (×4): qty 100

## 2021-10-11 MED ORDER — VANCOMYCIN HCL 1500 MG/300ML IV SOLN
1500.0000 mg | Freq: Once | INTRAVENOUS | Status: AC
  Administered 2021-10-11: 1500 mg via INTRAVENOUS
  Filled 2021-10-11: qty 300

## 2021-10-11 MED ORDER — LEVETIRACETAM 750 MG PO TABS
750.0000 mg | ORAL_TABLET | Freq: Two times a day (BID) | ORAL | Status: DC
  Administered 2021-10-12 – 2021-10-19 (×15): 750 mg via ORAL
  Filled 2021-10-11 (×16): qty 1

## 2021-10-11 MED ORDER — LEVOFLOXACIN IN D5W 750 MG/150ML IV SOLN
750.0000 mg | INTRAVENOUS | Status: DC
  Administered 2021-10-12: 750 mg via INTRAVENOUS
  Filled 2021-10-11 (×2): qty 150

## 2021-10-11 MED ORDER — HEPARIN BOLUS VIA INFUSION
3000.0000 [IU] | Freq: Once | INTRAVENOUS | Status: AC
  Administered 2021-10-11: 3000 [IU] via INTRAVENOUS
  Filled 2021-10-11: qty 3000

## 2021-10-11 MED ORDER — MORPHINE SULFATE (PF) 4 MG/ML IV SOLN
4.0000 mg | Freq: Once | INTRAVENOUS | Status: AC
  Administered 2021-10-11: 4 mg via INTRAVENOUS
  Filled 2021-10-11: qty 1

## 2021-10-11 MED ORDER — TOPIRAMATE 25 MG PO TABS
100.0000 mg | ORAL_TABLET | Freq: Two times a day (BID) | ORAL | Status: DC
  Administered 2021-10-11 – 2021-10-19 (×16): 100 mg via ORAL
  Filled 2021-10-11 (×18): qty 4

## 2021-10-11 MED ORDER — IOHEXOL 350 MG/ML SOLN
80.0000 mL | Freq: Once | INTRAVENOUS | Status: AC | PRN
  Administered 2021-10-11: 80 mL via INTRAVENOUS

## 2021-10-11 MED ORDER — HYDROMORPHONE HCL 1 MG/ML IJ SOLN
0.5000 mg | INTRAMUSCULAR | Status: DC | PRN
  Administered 2021-10-12 (×2): 0.5 mg via INTRAVENOUS
  Filled 2021-10-11 (×2): qty 1

## 2021-10-11 MED ORDER — SODIUM CHLORIDE 0.9 % IV SOLN: INTRAVENOUS | Status: DC

## 2021-10-11 MED ORDER — ACETAMINOPHEN 650 MG RE SUPP: 650.0000 mg | Freq: Four times a day (QID) | RECTAL | Status: DC | PRN

## 2021-10-11 MED ORDER — VANCOMYCIN HCL 1500 MG/300ML IV SOLN
1500.0000 mg | Freq: Two times a day (BID) | INTRAVENOUS | Status: DC
  Administered 2021-10-12 – 2021-10-14 (×5): 1500 mg via INTRAVENOUS
  Filled 2021-10-11 (×6): qty 300

## 2021-10-11 MED ORDER — METRONIDAZOLE 500 MG/100ML IV SOLN
500.0000 mg | Freq: Two times a day (BID) | INTRAVENOUS | Status: DC
Start: 2021-10-12 — End: 2021-10-11

## 2021-10-11 MED ORDER — LEVOFLOXACIN IN D5W 750 MG/150ML IV SOLN
750.0000 mg | Freq: Once | INTRAVENOUS | Status: AC
  Administered 2021-10-11: 750 mg via INTRAVENOUS
  Filled 2021-10-11: qty 150

## 2021-10-11 MED ORDER — ONDANSETRON HCL 4 MG/2ML IJ SOLN: 4.0000 mg | Freq: Four times a day (QID) | INTRAMUSCULAR | Status: DC | PRN

## 2021-10-11 NOTE — ED Notes (Signed)
Patient transported to CT 

## 2021-10-11 NOTE — Progress Notes (Signed)
Received pt from [facility] via [facility]. VSS. R neck w/ redness and tenderness, pain 9/10. IV pain med given- page on-call MD for PO pain med coverage. Pt updated on plan of care, will continue to monitor.  Jaymes Graff, RN

## 2021-10-11 NOTE — H&P (Signed)
History and Physical    KOL CONSUEGRA HTD:[id number] DOB: [birth year] DOA: 10/11/2021  PCP: Pcp, No  Patient coming from: home  I have personally briefly reviewed patient's old medical records in [facility]  Chief Complaint: neck pain and swelling  HPI: [name] is a 30 y.o. male with medical history significant of intravenous drug use, polysubstance use, seizure disorder, opiate addiction on Suboxone, chronic headaches on Topamax, history of Stevens-Johnson syndrome with penicillin, presents to the emergency room with a 3 to 4-day history of neck pain, swelling, fevers.  Reports that the last time he injected himself in his neck was at least 6 months ago.  At that time he was injected himself with Suboxone.  Patient says he purchases Xanax off the street and uses approximately one 2 mg tablet per day.  Reports that for the past 3 to 4 days he has had spontaneous swelling, pain of the right side of his neck.  This is progressed up to his face.  Patient appears to be intact.  He does have problems swallowing and does occasionally feel short of breath.  He has chronic headaches and chronic neck pain.  Also reports pain in his lower back for the past 3 to 4 days, which is worse with motion.  No numbness or tingling/weakness in his legs.  He reports being febrile for the past 3 days, but does not report how high his fever has been.  He did have some vomiting.  Denies any dysuria.  Patient had similar presentation in 06/2019 where he was admitted for left neck abscess and left IJ thrombosis.  He was anticoagulated at that time and was supposed to be on chronic Eliquis..  Reports being told by one of his providers to stop anticoagulation approximately a year ago.  ED Course: He was noted to be afebrile in the emergency room, vitals were otherwise stable.  WBC count normal.  CT of the neck shows cellulitis/phlegmon/abscess of the neck.  It is also noted that he has a right IJ thrombosis.  Case was  discussed by EDP with ENT who recommended initiating antibiotics and transfer to [facility] for further evaluation.  He was also started on Decadron.  EDP also discussed case with vascular surgery who recommended heparinizing for IJ thrombus.  Review of Systems: As per HPI otherwise 10 point review of systems negative.    Past Medical History:  Diagnosis Date   Hepatitis C    History of Stevens-Johnson toxic epidermal necrolysis overlap syndrome    with PCN, not with keflex   Opiate addiction ([place])    on methadone   Seizure [facility])     Past Surgical History:  Procedure Laterality Date   INCISION AND DRAINAGE ABSCESS Left 07/25/2019   Procedure: INCISION AND DRAINAGE NECK ABSCESS;  Surgeon: Jerrell Belfast, MD;  Location: [place];  Service: ENT;  Laterality: Left;   TYMPANOSTOMY TUBE PLACEMENT     19-89 years old    Social History:  reports that he has been smoking. He has never used smokeless tobacco. He reports that he does not currently use drugs after having used the following drugs: Marijuana. He reports that he does not drink alcohol.  Allergies  Allergen Reactions   Amoxicillin     Steven-Johnson's Syndrome   Ceclor [Cefaclor] Other (See Comments)    Steven-Johnson's Syndrome   Erythromycin     Steven-Johnson's Syndrome   Penicillins     Steven-Johnson's Syndrome   Septra [Sulfamethoxazole-Trimethoprim]  Steven-Johnson's Syndrome   Tylenol [Acetaminophen]     Family History  Problem Relation Age of Onset   Diabetes Other        grandparents   Stroke Other    Pancreatic cancer Other    Diabetes Mellitus II Other     Prior to Admission medications   Medication Sig Start Date End Date Taking? Authorizing Provider  cyclobenzaprine (FLEXERIL) 5 MG tablet Take 1 tablet at night as needed for headache/neck pain 04/29/21   Cameron Sprang, MD  ibuprofen (ADVIL,MOTRIN) 200 MG tablet Take 800 mg by mouth every 6 (six) hours as needed for headache.    [provider]  levETIRAcetam (KEPPRA) 500 MG tablet Take 1 and 1/2 tablets twice a day 04/29/21   Cameron Sprang, MD  SUBOXONE 8-2 MG FILM Place 1 Film under the tongue 2 (two) times daily. 01/30/19   [provider]  topiramate (TOPAMAX) 100 MG tablet Take 1 tablet (100 mg total) by mouth 2 (two) times daily. 04/29/21   Cameron Sprang, MD    Physical Exam: Vitals:   10/11/21 1530 10/11/21 1655 10/11/21 1800 10/11/21 1830  BP: 109/77 103/67 108/77 103/69  Pulse: 87 86 92 86  Resp: 16 12 17 13   Temp:      TempSrc:      SpO2: 100% 100% 98% 100%  Weight:      Height:        Constitutional: NAD, calm, comfortable HEENT: swelling of face, erythema and tenderness of right neck, right neck and right mandible tender to touch Eyes: PERRL, lids and conjunctivae normal ENMT: Mucous membranes are moist. Posterior pharynx clear of any exudate or lesions.Normal dentition.  Neck: normal, supple, no masses, no thyromegaly Respiratory: clear to auscultation bilaterally, no wheezing, no crackles. Normal respiratory effort. No accessory muscle use.  Cardiovascular: Regular rate and rhythm, no murmurs / rubs / gallops. No extremity edema. 2+ pedal pulses. No carotid bruits.  Abdomen: no tenderness, no masses palpated. No hepatosplenomegaly. Bowel sounds positive.  Musculoskeletal: no clubbing / cyanosis. No joint deformity upper and lower extremities. Good ROM, no contractures. Normal muscle tone.  Skin: no rashes, lesions, ulcers. No induration Neurologic: CN 2-12 grossly intact. Sensation intact, DTR normal. Strength 5/5 in all 4.  Psychiatric: Normal judgment and insight. Alert and oriented x 3. Normal mood.    Labs on Admission: I have personally reviewed following labs and imaging studies  CBC: Recent Labs  Lab 10/11/21 1330  WBC 9.5  NEUTROABS 7.3  HGB 12.3*  HCT 37.4*  MCV 86.2  PLT 361   Basic Metabolic Panel: Recent Labs  Lab 10/11/21 1330  NA 137  K 3.9  CL 97*  CO2 30  GLUCOSE  103*  BUN 10  CREATININE 0.62  CALCIUM 9.2   GFR: Estimated Creatinine Clearance: 130.8 mL/min (by C-G formula based on SCr of 0.62 mg/dL). Liver Function Tests: Recent Labs  Lab 10/11/21 1330  AST 12*  ALT 9  ALKPHOS 68  BILITOT 0.4  PROT 7.8  ALBUMIN 4.1   Recent Labs  Lab 10/11/21 1330  LIPASE 28   No results for input(s): AMMONIA in the last 168 hours. Coagulation Profile: No results for input(s): INR, PROTIME in the last 168 hours. Cardiac Enzymes: No results for input(s): CKTOTAL, CKMB, CKMBINDEX, TROPONINI in the last 168 hours. BNP (last 3 results) No results for input(s): PROBNP in the last 8760 hours. HbA1C: No results for input(s): HGBA1C in the last 72 hours.  CBG: No results for input(s): GLUCAP in the last 168 hours. Lipid Profile: No results for input(s): CHOL, HDL, LDLCALC, TRIG, CHOLHDL, LDLDIRECT in the last 72 hours. Thyroid Function Tests: No results for input(s): TSH, T4TOTAL, FREET4, T3FREE, THYROIDAB in the last 72 hours. Anemia Panel: No results for input(s): VITAMINB12, FOLATE, FERRITIN, TIBC, IRON, RETICCTPCT in the last 72 hours. Urine analysis:    Component Value Date/Time   COLORURINE AMBER (A) 10/16/2014 1955   APPEARANCEUR CLEAR 10/16/2014 1955   LABSPEC 1.023 10/16/2014 1955   PHURINE 7.5 10/16/2014 1955   GLUCOSEU NEGATIVE 10/16/2014 1955   HGBUR NEGATIVE 10/16/2014 1955   BILIRUBINUR NEGATIVE 10/16/2014 1955   KETONESUR NEGATIVE 10/16/2014 1955   PROTEINUR NEGATIVE 10/16/2014 1955   UROBILINOGEN 4.0 (H) 10/16/2014 1955   NITRITE NEGATIVE 10/16/2014 1955   LEUKOCYTESUR NEGATIVE 10/16/2014 1955    Radiological Exams on Admission: CT Soft Tissue Neck W Contrast  Result Date: 10/11/2021 CLINICAL DATA:  Neck mass, initial workup EXAM: CT NECK WITH CONTRAST TECHNIQUE: Multidetector CT imaging of the neck was performed using the standard protocol following the bolus administration of intravenous contrast. CONTRAST:  64mL OMNIPAQUE  IOHEXOL 350 MG/ML SOLN COMPARISON:  CT neck July 22, 2019 FINDINGS: Pharynx and larynx: Retropharyngeal and hypopharyngeal edema. Retropharyngeal edema extends from the craniocervical junction inferiorly to approximately the C5-C6 level, measuring up to 6 mm in thickness. There is some mass effect on the supraglottic airway, which remains patent. Salivary glands: No inflammation, mass, or stone. Thyroid: Normal. Lymph nodes: Mildly prominent right supraclavicular and cervical chain nodes, probably reactive given low findings. Vascular/soft tissues: Thrombosis of the right internal jugular vein from the C5 level inferiorly. At the level of the thyroid gland there is an adjacent abscess that is likely contiguous with thrombosed internal jugular vein; however, the vein is not well visualized in this region. This abscess is irregular and measures approximately 6 2.3 x 1.8 x 2.6 cm (series 5, image 92; series 9, image 79). There is extensive surrounding edema/soft tissue thickening with asymmetric enlargement of the adjacent sternocleidomastoid, compatible with cellulitis/phlegmon and myositis. Also, in this vicinity there are 3 linear foreign bodies, probably needles from IV drug use (annotated on series 5, images 83, 95, and 98). These needles are chronic and present on prior CT neck 2020. The left internal jugular vein is not well visualized. Limited intracranial: Negative. Visualized orbits: Negative. Mastoids and visualized paranasal sinuses: Mild paranasal sinus mucosal thickening. No mastoid effusions. Skeleton: No evidence of acute abnormality. Upper chest: Evaluated on concurrent CT chest. IMPRESSION: 1. Findings compatible with right internal jugular vein thrombophlebitis with extensive surrounding cellulitis/phlegmon and abscess in the right lower neck at the level of the thyroid, as detailed above. Surrounding spread of infection with retropharyngeal and hypopharyngeal edema and overlying  sternocleidomastoid myositis. 2. Multiple chronic linear foreign bodies in the right lower neck, probably retained needles from IV drug use. 3. The left internal jugular vein is not well visualized, potentially chronically thrombosed or postsurgical given findings on prior CT neck. Electronically Signed   By: Margaretha Sheffield M.D.   On: 10/11/2021 16:24   CT Angio Chest PE W and/or Wo Contrast  Result Date: 10/11/2021 CLINICAL DATA:  IV drug use; PE suspected, high prob History of IJ thrombus, concern for recurrence but also concern for pulmonary embolism or SVC syndrome. EXAM: CT ANGIOGRAPHY CHEST WITH CONTRAST TECHNIQUE: Multidetector CT imaging of the chest was performed using the standard protocol during bolus administration of intravenous contrast. Multiplanar CT  image reconstructions and MIPs were obtained to evaluate the vascular anatomy. CONTRAST:  44mL OMNIPAQUE IOHEXOL 350 MG/ML SOLN COMPARISON:  July 24, 2019. FINDINGS: Cardiovascular: Satisfactory opacification of the pulmonary arteries to the segmental level. No evidence of pulmonary embolism. Normal heart size. No pericardial effusion. Mediastinum/Nodes: There are several linear metallic densities overlapping the RIGHT neck likely reflecting retained needle fragments from IV drug use (series 6, image 26; series 6, image 30; series 6, image 39).; Please see separately dictated report regarding neck findings. Soft tissue the anterior mediastinum likely reflecting residual thymus. No axillary adenopathy. Mildly prominent RIGHT paratracheal lymph node measures 8 mm in the short axis and is likely reactive in etiology (series 4, image 54). Lungs/Pleura: Bibasilar atelectasis. No pleural effusion or pneumothorax. Upper Abdomen: No acute abnormality. Musculoskeletal: No acute osseous abnormality. Review of the MIP images confirms the above findings. IMPRESSION: 1.  No acute pulmonary embolism 2. There are several linear metallic densities overlapping  the RIGHT neck likely reflecting retained needle fragments from IV drug use; Please see separately dictated report regarding neck findings. Electronically Signed   By: Valentino Saxon M.D.   On: 10/11/2021 16:11    EKG: Independently reviewed. Sinus rhythm without acute changes  Assessment/Plan Active Problems:   Cellulitis   IV drug abuse (HCC)   Neck abscess   Internal jugular (IJ) vein thromboembolism, acute, right (HCC)   Seizure disorder ([place])   Foreign body of neck     Right neck cellulitis/abscess.   -Case was discussed with ENT who recommended starting IV antibiotics and transfer to [facility] for further evaluation -Since he has multiple antibiotic allergies and has had Stevens-Johnson's in the past, EDP discussed with pharmacy who recommended that patient be on vancomycin, Flagyl, Levaquin.  This was reviewed with ENT who felt this was reasonable. -CT also reports foreign bodies, possibly remnants of needles at the base of patient's neck -Blood cultures have been sent  Right internal jugular vein thrombosis -Secondary to underlying infectious process -Started on intravenous heparin  Seizure disorder -Followed by Dr. Delice Lesch -Reports compliance with Keppra -Continue home dose of Keppra  Chronic headache/neck pain -Has been prescribed Topamax and Flexeril per neurology  Back pain -Unclear etiology, but with ongoing infectious process, concern for infection -Does not have any neurodeficits at this time -Continue IV antibiotics -If blood cultures positive, would consider further imaging of spine -Ideally, would prefer MRI, although with remnants of foreign bodies in his neck, this may not be possible  Polysubstance abuse -Counseled on the dangers of ongoing illicit drug use -Check urine drug screen  DVT prophylaxis: heparin infusion  Code Status: full code  Family Communication: discussed with patient  Disposition Plan: discharge home once infection improved   Consults called: ENT, Dr. Fredric Dine, Vascular Dr. Donzetta Matters  Admission status: inpatient, progressive care at [facility]   If 7PM-7AM, please contact night-coverage [email or web address]   10/11/2021, 7:14 PM

## 2021-10-11 NOTE — ED Notes (Signed)
Pt off unit for transport at this time

## 2021-10-11 NOTE — Progress Notes (Signed)
ANTICOAGULATION CONSULT NOTE  Pharmacy Consult for IV heparin Indication: septic thrombophlebitis  Allergies  Allergen Reactions   Amoxicillin     Steven-Johnson's Syndrome   Ceclor [Cefaclor] Other (See Comments)    Steven-Johnson's Syndrome   Erythromycin     Steven-Johnson's Syndrome   Penicillins     Steven-Johnson's Syndrome   Septra [Sulfamethoxazole-Trimethoprim]     Steven-Johnson's Syndrome   Tylenol [Acetaminophen]     Patient Measurements: Height: 5\' 6"  (167.6 cm) Weight: 77.1 kg (170 lb) IBW/kg (Calculated) : 63.8 Heparin Dosing Weight: TBW  Vital Signs: Temp: 98.2 F (36.8 C) (11/13 1231) Temp Source: Oral (11/13 1231) BP: 103/69 (11/13 1830) Pulse Rate: 86 (11/13 1830)  Labs: Recent Labs    10/11/21 1330  HGB 12.3*  HCT 37.4*  PLT 309  CREATININE 0.62  TROPONINIHS <2    Estimated Creatinine Clearance: 130.8 mL/min (by C-G formula based on SCr of 0.62 mg/dL).   Medical History: Past Medical History:  Diagnosis Date   Hepatitis C    History of Stevens-Johnson toxic epidermal necrolysis overlap syndrome    with PCN, not with keflex   Opiate addiction ([place])    on methadone   Seizure ([place])     Medications:  (Not in a hospital admission)  Scheduled:   dexamethasone (DECADRON) injection  8 mg Intravenous Q8H   heparin  3,000 Units Intravenous Once   levETIRAcetam  750 mg Oral BID   topiramate  100 mg Oral BID   Infusions:   sodium chloride     heparin     levofloxacin (LEVAQUIN) IV 750 mg (10/11/21 1826)   [START ON 10/12/2021] levofloxacin (LEVAQUIN) IV     metronidazole 500 mg (10/11/21 1655)   vancomycin     [START ON 10/12/2021] vancomycin      Assessment: 4 yoM with PMH IVDU including injection into neck, admitted with septic thrombophlebitis of L jugular vein. Previous episode of same in Aug 2020, with patient noted to require high doses of heparin. ATIII level checked during that admission and found to be borderline low  (75%).  Baseline INR, aPTT: not done; no PTA antithrombotics Prior anticoagulation: none  Significant events:  Today, 10/11/2021: CBC: Hgb slightly low; Plt WNL SCr stable, at baseline No bleeding or infusion issues per nursing  Goal of Therapy: Heparin level 0.3-0.7 units/ml Monitor platelets by anticoagulation protocol: Yes  Plan: Heparin 3000 units IV bolus x 1 Heparin 1400 units/hr IV infusion Check heparin level 6 hrs after start Daily CBC, daily heparin level once stable Monitor for signs of bleeding or thrombosis  Reuel Boom, PharmD, BCPS [phone] 10/11/2021, 7:31 PM

## 2021-10-11 NOTE — ED Provider Notes (Signed)
[facility] DEPT Provider Note   CSN: [id number] Arrival date & time: 10/11/21  1222     History Chief Complaint  Patient presents with   Neck Pain    [name] is a 30 y.o. male.  The history is provided by the patient and medical records. No language interpreter was used.  Neck Pain Pain location:  R side Quality:  Aching and shooting Radiates to: chest. Pain severity:  Severe Pain is:  Same all the time Onset quality:  Gradual Duration:  3 days Timing:  Constant Progression:  Worsening Chronicity:  Recurrent Relieved by:  Nothing Worsened by:  Twisting Ineffective treatments:  None tried Associated symptoms: chest pain   Associated symptoms: no bladder incontinence, no bowel incontinence, no fever (chills present), no headaches, no leg pain, no numbness, no photophobia, no tingling, no visual change and no weakness       Past Medical History:  Diagnosis Date   Hepatitis C    History of Stevens-Johnson toxic epidermal necrolysis overlap syndrome    with PCN, not with keflex   Opiate addiction (HCC)    on methadone   Seizure [facility]     Patient Active Problem List   Diagnosis Date Noted   Internal jugular vein thrombosis, 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   Thrombocytosis 07/22/2019   Anemia 07/22/2019   Neck abscess 02/07/2019   Hepatitis C 10/17/2014   Acute foreign body of left upper arm 10/17/2014   Tobacco abuse 10/17/2014   Cellulitis 10/16/2014   Cellulitis of forearm, left 10/16/2014   IV drug abuse ([place]) 10/16/2014    Past Surgical History:  Procedure Laterality Date   INCISION AND DRAINAGE ABSCESS Left 07/25/2019   Procedure: INCISION AND DRAINAGE NECK ABSCESS;  Surgeon: Jerrell Belfast, MD;  Location: [facility] OR;  Service: ENT;  Laterality: Left;   TYMPANOSTOMY TUBE PLACEMENT     74-50 years old       Family History  Problem Relation Age of Onset   Diabetes Other        grandparents   Stroke Other    Pancreatic  cancer Other    Diabetes Mellitus II Other     Social History   Tobacco Use   Smoking status: Every Day    Packs/day: 0.00    Years: 5.00    Pack years: 0.00    Types: Cigarettes   Smokeless tobacco: Never  Vaping Use   Vaping Use: Never used  Substance Use Topics   Alcohol use: No   Drug use: Not Currently    Types: Marijuana    Comment: Opiod addiction    Home Medications Prior to Admission medications   Medication Sig Start Date End Date Taking? Authorizing Provider  cyclobenzaprine (FLEXERIL) 5 MG tablet Take 1 tablet at night as needed for headache/neck pain 04/29/21   Cameron Sprang, MD  ibuprofen (ADVIL,MOTRIN) 200 MG tablet Take 800 mg by mouth every 6 (six) hours as needed for headache.    [provider]  levETIRAcetam (KEPPRA) 500 MG tablet Take 1 and 1/2 tablets twice a day 04/29/21   Cameron Sprang, MD  SUBOXONE 8-2 MG FILM Place 1 Film under the tongue 2 (two) times daily. 01/30/19   [provider]  topiramate (TOPAMAX) 100 MG tablet Take 1 tablet (100 mg total) by mouth 2 (two) times daily. 04/29/21   Cameron Sprang, MD    Allergies    Amoxicillin, Ceclor [cefaclor], Erythromycin, Penicillins, Septra [sulfamethoxazole-trimethoprim], and Tylenol [acetaminophen]  Review of Systems   Review of Systems  Constitutional:  Positive for chills and fatigue. Negative for diaphoresis and fever (chills present).  HENT:  Positive for facial swelling. Negative for congestion.   Eyes:  Negative for photophobia.  Respiratory:  Negative for cough, chest tightness, shortness of breath and wheezing.   Cardiovascular:  Positive for chest pain.  Gastrointestinal:  Positive for nausea. Negative for abdominal pain, bowel incontinence, constipation, diarrhea and vomiting.  Genitourinary:  Negative for bladder incontinence and flank pain.  Musculoskeletal:  Positive for neck pain. Negative for back pain.  Skin:  Positive for color change (redness on R neck). Negative  for rash and wound.  Neurological:  Negative for tingling, weakness, light-headedness, numbness and headaches.  Psychiatric/Behavioral:  Negative for agitation.   All other systems reviewed and are negative.  Physical Exam Updated Vital Signs BP 121/80 (BP Location: Right Arm)   Pulse (!) 110   Temp 98.2 F (36.8 C) (Oral)   Resp 16   Ht 5\' 6"  (1.676 m)   Wt 77.1 kg   SpO2 100%   BMI 27.44 kg/m   Physical Exam Vitals and nursing note reviewed.  Constitutional:      General: He is not in acute distress.    Appearance: He is well-developed. He is ill-appearing. He is not toxic-appearing or diaphoretic.  HENT:     Head: Normocephalic and atraumatic.     Nose: Nose normal.     Mouth/Throat:     Mouth: Mucous membranes are moist.     Pharynx: No oropharyngeal exudate or posterior oropharyngeal erythema.  Eyes:     Extraocular Movements: Extraocular movements intact.     Conjunctiva/sclera: Conjunctivae normal.     Pupils: Pupils are equal, round, and reactive to light.  Neck:      Comments: Some mild edema of the face.  Tenderness to neck.  Intact pulses in extremities.  Lungs clear. Cardiovascular:     Rate and Rhythm: Regular rhythm. Tachycardia present.     Pulses: Normal pulses.     Heart sounds: No murmur heard. Pulmonary:     Effort: Pulmonary effort is normal. No respiratory distress.     Breath sounds: Normal breath sounds. No wheezing, rhonchi or rales.  Chest:     Chest wall: No tenderness.  Abdominal:     General: Abdomen is flat.     Palpations: Abdomen is soft.     Tenderness: There is no abdominal tenderness. There is no guarding.  Musculoskeletal:        General: Tenderness present.     Cervical back: Erythema and tenderness present.  Skin:    General: Skin is warm and dry.     Capillary Refill: Capillary refill takes less than 2 seconds.     Findings: Erythema present.  Neurological:     General: No focal deficit present.     Mental Status: He is  alert.  Psychiatric:        Mood and Affect: Mood normal.    ED Results / Procedures / Treatments   Labs (all labs ordered are listed, but only abnormal results are displayed) Labs Reviewed  CBC WITH DIFFERENTIAL/PLATELET - Abnormal; Notable for the following components:      Result Value   Hemoglobin 12.3 (*)    HCT 37.4 (*)    All other components within normal limits  COMPREHENSIVE METABOLIC PANEL - Abnormal; Notable for the following components:   Chloride 97 (*)    Glucose, Bld 103 (*)  AST 12 (*)    All other components within normal limits  RESP PANEL BY RT-PCR (FLU A&B, COVID) [id number]  CULTURE, BLOOD (ROUTINE X 2)  CULTURE, BLOOD (ROUTINE X 2)  LACTIC ACID, PLASMA  LIPASE, BLOOD  LACTIC ACID, PLASMA  TROPONIN I (HIGH SENSITIVITY)  TROPONIN I (HIGH SENSITIVITY)    EKG EKG Interpretation  Date/Time:  Sunday October 11 2021 14:25:15 EST Ventricular Rate:  99 PR Interval:  144 QRS Duration: 87 QT Interval:  316 QTC Calculation: 406 R Axis:   85 Text Interpretation: Sinus rhythm When compared to prior, similar appearance with faster rate. No STEMI Confirmed by Antony Blackbird [phone]) on 10/11/2021 2:28:06 PM  Radiology CT Soft Tissue Neck W Contrast  Result Date: 10/11/2021 CLINICAL DATA:  Neck mass, initial workup EXAM: CT NECK WITH CONTRAST TECHNIQUE: Multidetector CT imaging of the neck was performed using the standard protocol following the bolus administration of intravenous contrast. CONTRAST:  73mL OMNIPAQUE IOHEXOL 350 MG/ML SOLN COMPARISON:  CT neck July 22, 2019 FINDINGS: Pharynx and larynx: Retropharyngeal and hypopharyngeal edema. Retropharyngeal edema extends from the craniocervical junction inferiorly to approximately the C5-C6 level, measuring up to 6 mm in thickness. There is some mass effect on the supraglottic airway, which remains patent. Salivary glands: No inflammation, mass, or stone. Thyroid: Normal. Lymph nodes: Mildly prominent right  supraclavicular and cervical chain nodes, probably reactive given low findings. Vascular/soft tissues: Thrombosis of the right internal jugular vein from the C5 level inferiorly. At the level of the thyroid gland there is an adjacent abscess that is likely contiguous with thrombosed internal jugular vein; however, the vein is not well visualized in this region. This abscess is irregular and measures approximately 6 2.3 x 1.8 x 2.6 cm (series 5, image 92; series 9, image 79). There is extensive surrounding edema/soft tissue thickening with asymmetric enlargement of the adjacent sternocleidomastoid, compatible with cellulitis/phlegmon and myositis. Also, in this vicinity there are 3 linear foreign bodies, probably needles from IV drug use (annotated on series 5, images 83, 95, and 98). These needles are chronic and present on prior CT neck 2020. The left internal jugular vein is not well visualized. Limited intracranial: Negative. Visualized orbits: Negative. Mastoids and visualized paranasal sinuses: Mild paranasal sinus mucosal thickening. No mastoid effusions. Skeleton: No evidence of acute abnormality. Upper chest: Evaluated on concurrent CT chest. IMPRESSION: 1. Findings compatible with right internal jugular vein thrombophlebitis with extensive surrounding cellulitis/phlegmon and abscess in the right lower neck at the level of the thyroid, as detailed above. Surrounding spread of infection with retropharyngeal and hypopharyngeal edema and overlying sternocleidomastoid myositis. 2. Multiple chronic linear foreign bodies in the right lower neck, probably retained needles from IV drug use. 3. The left internal jugular vein is not well visualized, potentially chronically thrombosed or postsurgical given findings on prior CT neck. Electronically Signed   By: Margaretha Sheffield M.D.   On: 10/11/2021 16:24   CT Angio Chest PE W and/or Wo Contrast  Result Date: 10/11/2021 CLINICAL DATA:  IV drug use; PE suspected,  high prob History of IJ thrombus, concern for recurrence but also concern for pulmonary embolism or SVC syndrome. EXAM: CT ANGIOGRAPHY CHEST WITH CONTRAST TECHNIQUE: Multidetector CT imaging of the chest was performed using the standard protocol during bolus administration of intravenous contrast. Multiplanar CT image reconstructions and MIPs were obtained to evaluate the vascular anatomy. CONTRAST:  53mL OMNIPAQUE IOHEXOL 350 MG/ML SOLN COMPARISON:  July 24, 2019. FINDINGS: Cardiovascular: Satisfactory opacification of the pulmonary arteries to  the segmental level. No evidence of pulmonary embolism. Normal heart size. No pericardial effusion. Mediastinum/Nodes: There are several linear metallic densities overlapping the RIGHT neck likely reflecting retained needle fragments from IV drug use (series 6, image 26; series 6, image 30; series 6, image 39).; Please see separately dictated report regarding neck findings. Soft tissue the anterior mediastinum likely reflecting residual thymus. No axillary adenopathy. Mildly prominent RIGHT paratracheal lymph node measures 8 mm in the short axis and is likely reactive in etiology (series 4, image 54). Lungs/Pleura: Bibasilar atelectasis. No pleural effusion or pneumothorax. Upper Abdomen: No acute abnormality. Musculoskeletal: No acute osseous abnormality. Review of the MIP images confirms the above findings. IMPRESSION: 1.  No acute pulmonary embolism 2. There are several linear metallic densities overlapping the RIGHT neck likely reflecting retained needle fragments from IV drug use; Please see separately dictated report regarding neck findings. Electronically Signed   By: Valentino Saxon M.D.   On: 10/11/2021 16:11    Procedures Procedures   CRITICAL CARE Performed by: Gwenyth Allegra Brookie Wayment Total critical care time: 60 minutes Critical care time was exclusive of separately billable procedures and treating other patients. Critical care was necessary to treat  or prevent imminent or life-threatening deterioration. Critical care was time spent personally by me on the following activities: development of treatment plan with patient and/or surrogate as well as nursing, discussions with consultants, evaluation of patient's response to treatment, examination of patient, obtaining history from patient or surrogate, ordering and performing treatments and interventions, ordering and review of laboratory studies, ordering and review of radiographic studies, pulse oximetry and re-evaluation of patient's condition.   Medications Ordered in ED Medications  metroNIDAZOLE (FLAGYL) IVPB 500 mg (500 mg Intravenous New Bag/Given 10/11/21 1655)  vancomycin (VANCOREADY) IVPB 1500 mg/300 mL (has no administration in time range)  levofloxacin (LEVAQUIN) IVPB 750 mg (has no administration in time range)  dexamethasone (DECADRON) injection 8 mg (has no administration in time range)  morphine 4 MG/ML injection 4 mg (4 mg Intravenous Given 10/11/21 1428)  ondansetron (ZOFRAN) injection 4 mg (4 mg Intravenous Given 10/11/21 1427)  iohexol (OMNIPAQUE) 350 MG/ML injection 80 mL (80 mLs Intravenous Contrast Given 10/11/21 1538)  morphine 4 MG/ML injection 4 mg (4 mg Intravenous Given 10/11/21 1651)    ED Course  I have reviewed the triage vital signs and the nursing notes.  Pertinent labs & imaging results that were available during my care of the patient were reviewed by me and considered in my medical decision making (see chart for details).    MDM Rules/Calculators/A&P                           ABIGAIL MARSIGLIA is a 30 y.o. male with past medical history significant for previous IV drug abuse with previous internal jugular vein thrombosis and neck abscess, seizures, previous Stevens-Johnson syndrome, and hepatitis C who presents with several days of right-sided neck pain and swelling, chest pain, shortness of breath, nausea, fatigue, facial swelling, and chills.  According  to patient, patient symptoms feel similar to when he had his thrombosis in his neck several years ago with an abscess.  He reports he has not done any IV drug use and has not had any skin changes as far as lacerations or scrapes to his neck.  He says the previous trouble was on the left side of his neck but this is on the right side.  He reports that he has  pain when twisting his neck side to side and neck is swollen going into the chest.  It is slightly red and erythematous overlying.  He denies any neurologic complaints with no numbness, tingling, weakness of extremities.  He denies any vision changes or speech difficulties.  He describes the pain is severe.  He reports he is having some chest pain or shortness of breath and was tachycardic on arrival.  He says that he has been off of his Eliquis for over a year now.  He says that he cannot raise his arms as this makes everything worse and he has a swollen face.  On exam, lungs were clear and chest was nontender however right neck was very tender to outpatient.  I do not appreciate crepitance but there is some overlying erythema.  I did not appreciate stridor.  Posterior neck was nontender.  Abdomen was nontender.  Intact pulses in extremities.  No lower extremity tenderness or swelling seen.  Some scrapes on his lower legs.  Clinically I am concerned about recurrent internal jugular thrombosis on the right side is at the left with possible SVC syndrome causing the edema in the face and also concern for possible pulmonary embolism given his chest pain, shortness breath, tachycardia, and concern for clot.  I spoke with radiology who recommended CT PE study followed by CT soft tissue neck to get a better look at all of these concerning areas.  We will also get troponins, EKG, and basic labs.  Given his previous infection, will also do infectious work-up with lactic acid and blood cultures.  He will be given pain medicine, nausea medicine for symptom  management.  Anticipate reassessment after work-up to determine disposition.  Patient's work-up began to return and his CT scan of the chest does not show evidence of pulmonary embolism but the CT of his neck does show new internal jugular thrombophlebitis with phlegmon, cellulitis, and abscess with extension into the retropharyngeal space with hypopharyngeal edema and sternocleidomastoid myositis.  There is also new foreign bodies in the side of the neck consistent with needles.  I asked the patient if he has done any recent injections and he said it has been at least 6 months since his last injection on to this part of his neck.  This is new since his last imaging but he denies any recent injections.  ENT will be called.  Patient will need admission.  I spoke to pharmacy who reviewed all of his history given the Stevens-Johnson syndrome he had to multiple antibiotics previously and pharmacy recommended vancomycin, Flagyl, and Levaquin.  Anticipate admission after speaking with ENT.  5:09 PM Just poke with Dr. Fredric Dine with ENT who reviewed the case.  She agrees with the IV antibiotic regimen that pharmacy was able to come up with given his previous Stevens-Johnson syndrome.  She feels that given a foreign bodies in the neck and the fact that his left IJ appears patent, she does not think he needs emergent surgical management at this time.  She will follow and recommended conservative management with antibiotics initially given his reassuring vital signs and initial lab testing and will defer management of the thrombus to vascular surgery and decision for anticoagulation to them.  We will consult vascular surgery and will admit.  ENT feels she would be better served at [facility] likely at a stepdown level bed with medicine in case things were to worsen and he needed surgical management.  5:29 PM Just spoke with Dr. Donzetta Matters with  vascular surgery who recommends heparinization for the DVT in the neck but he  does not feel patient needs emergent intervention from the clot standpoint.  He will defer to infection management to ENT and agrees with admission to [facility].  Will call medicine for admission.  Final Clinical Impression(s) / ED Diagnoses Final diagnoses:  Thrombophlebitis of internal jugular vein  Neck abscess    Rx / DC Orders ED Discharge Orders     None      Clinical Impression: 1. Thrombophlebitis of internal jugular vein   2. Neck abscess     Disposition: Admit  This note was prepared with assistance of Dragon voice recognition software. Occasional wrong-word or sound-a-like substitutions may have occurred due to the inherent limitations of voice recognition software.     Korin Hartwell, Gwenyth Allegra, MD 10/11/21 [phone]

## 2021-10-11 NOTE — Progress Notes (Signed)
Pharmacy Antibiotic Note  [name] is a 30 y.o. male admitted on 10/11/2021 with  neck abscess/cellulitis .  Pharmacy has been consulted for vanc dosing along with levaquin/flagyl.   Plan: Vanc 1500mg  IV x 1 ordered in ER, start 1500mg  IV q12 thereafter - gaol AUC 400-550   Height: 5\' 6"  (167.6 cm) Weight: 77.1 kg (170 lb) IBW/kg (Calculated) : 63.8  Temp (24hrs), Avg:98.2 F (36.8 C), Min:98.2 F (36.8 C), Max:98.2 F (36.8 C)  Recent Labs  Lab 10/11/21 1330  WBC 9.5  CREATININE 0.62  LATICACIDVEN 0.9    Estimated Creatinine Clearance: 130.8 mL/min (by C-G formula based on SCr of 0.62 mg/dL).    Allergies  Allergen Reactions   Amoxicillin     Steven-Johnson's Syndrome   Ceclor [Cefaclor] Other (See Comments)    Steven-Johnson's Syndrome   Erythromycin     Steven-Johnson's Syndrome   Penicillins     Steven-Johnson's Syndrome   Septra [Sulfamethoxazole-Trimethoprim]     Steven-Johnson's Syndrome   Tylenol [Acetaminophen]       Thank you for allowing pharmacy to be a part of this patient's care.  Kara Mead 10/11/2021 7:21 PM

## 2021-10-11 NOTE — ED Triage Notes (Signed)
PT reports pain to right neck since yesterday. Hx abscess to neck with surgery and IV drug use. Denies drug use today. Drowsy in triage. Speaking in full sentences.

## 2021-10-11 NOTE — Progress Notes (Signed)
A consult was received from an ED physician for vancomycin and Levaquin per pharmacy dosing (for an indication other than meningitis). The patient's profile has been reviewed for ht/wt/allergies/indication/available labs. A one time order has been placed for the above antibiotics.  Further antibiotics/pharmacy consults should be ordered by admitting physician if indicated.                       Reuel Boom, PharmD, BCPS [phone] 10/11/2021, 4:52 PM

## 2021-10-11 NOTE — ED Notes (Signed)
ED TO INPATIENT HANDOFF REPORT  ED Nurse Name and Phone #:   S Name/Age/Gender [name] 30 y.o. male Room/Bed: WA09/WA09  Code Status   Code Status: Full Code  Home/SNF/Other Home Patient oriented to: self, place, time, and situation Is this baseline? Yes   Triage Complete: Triage complete  Chief Complaint Neck abscess [L02.11]  Triage Note PT reports pain to right neck since yesterday. Hx abscess to neck with surgery and IV drug use. Denies drug use today. Drowsy in triage. Speaking in full sentences.   Allergies Allergies  Allergen Reactions   Amoxicillin     Steven-Johnson's Syndrome   Ceclor [Cefaclor] Other (See Comments)    Steven-Johnson's Syndrome   Erythromycin     Steven-Johnson's Syndrome   Penicillins     Steven-Johnson's Syndrome   Septra [Sulfamethoxazole-Trimethoprim]     Steven-Johnson's Syndrome   Tylenol [Acetaminophen]     Level of Care/Admitting Diagnosis ED Disposition     ED Disposition  Admit   Condition  --   Comment  Hospital Area: [place] [[id number]]  Level of Care: Progressive [102]  Admit to Progressive based on following criteria: Other see comments  Comments: patient with neck abscess/swelling needs close monitoring for airway compromise  May admit patient to [facility] or [facility] if equivalent level of care is available:: No  Covid Evaluation: Confirmed COVID Negative  Diagnosis: Neck abscess [585277]  Admitting Physician: [place], [place]  Attending Physician: Kathie Dike [[id number]]  Estimated length of stay: past midnight tomorrow  Certification:: I certify this patient will need inpatient services for at least 2 midnights          B Medical/Surgery History Past Medical History:  Diagnosis Date   Hepatitis C    History of Stevens-Johnson toxic epidermal necrolysis overlap syndrome    with PCN, not with keflex   Opiate addiction ([place])    on methadone   Seizure [facility])    Past  Surgical History:  Procedure Laterality Date   INCISION AND DRAINAGE ABSCESS Left 07/25/2019   Procedure: INCISION AND DRAINAGE NECK ABSCESS;  Surgeon: Jerrell Belfast, MD;  Location: [facility] OR;  Service: ENT;  Laterality: Left;   TYMPANOSTOMY TUBE PLACEMENT     59-25 years old     A IV Location/Drains/Wounds Patient Lines/Drains/Airways Status     Active Line/Drains/Airways     Name Placement date Placement time Site Days   Peripheral IV 10/11/21 20 G Anterior;Right;Upper Arm 10/11/21  1420  Arm  less than 1   Peripheral IV 10/11/21 20 G Left;Upper Arm 10/11/21  2033  Arm  less than 1   Open Drain 1 Left Neck  07/25/19  1639  Neck  809   Incision (Closed) 07/25/19 Neck Left 07/25/19  1651  -- 809            Intake/Output Last 24 hours  Intake/Output Summary (Last 24 hours) at 10/11/2021 2142 Last data filed at 10/11/2021 1956 Gross per 24 hour  Intake 200 ml  Output --  Net 200 ml    Labs/Imaging Results for orders placed or performed during the hospital encounter of 10/11/21 (from the past 48 hour(s))  CBC with Differential     Status: Abnormal   Collection Time: 10/11/21  1:30 PM  Result Value Ref Range   WBC 9.5 4.0 - 10.5 K/uL   RBC 4.34 4.22 - 5.81 MIL/uL   Hemoglobin 12.3 (L) 13.0 - 17.0 g/dL   HCT 37.4 (L) 39.0 -  52.0 %   MCV 86.2 80.0 - 100.0 fL   MCH 28.3 26.0 - 34.0 pg   MCHC 32.9 30.0 - 36.0 g/dL   RDW 12.2 11.5 - 15.5 %   Platelets 309 150 - 400 K/uL   nRBC 0.0 0.0 - 0.2 %   Neutrophils Relative % 78 %   Neutro Abs 7.3 1.7 - 7.7 K/uL   Lymphocytes Relative 16 %   Lymphs Abs 1.5 0.7 - 4.0 K/uL   Monocytes Relative 6 %   Monocytes Absolute 0.6 0.1 - 1.0 K/uL   Eosinophils Relative 0 %   Eosinophils Absolute 0.0 0.0 - 0.5 K/uL   Basophils Relative 0 %   Basophils Absolute 0.0 0.0 - 0.1 K/uL   Immature Granulocytes 0 %   Abs Immature Granulocytes 0.02 0.00 - 0.07 K/uL    Comment: Performed at [facility], [place] [address]  [place], [place] [address]  Comprehensive metabolic panel     Status: Abnormal   Collection Time: 10/11/21  1:30 PM  Result Value Ref Range   Sodium 137 135 - 145 mmol/L   Potassium 3.9 3.5 - 5.1 mmol/L   Chloride 97 (L) 98 - 111 mmol/L   CO2 30 22 - 32 mmol/L   Glucose, Bld 103 (H) 70 - 99 mg/dL    Comment: Glucose reference range applies only to samples taken after fasting for at least 8 hours.   BUN 10 6 - 20 mg/dL   Creatinine, Ser 0.62 0.61 - 1.24 mg/dL   Calcium 9.2 8.9 - 10.3 mg/dL   Total Protein 7.8 6.5 - 8.1 g/dL   Albumin 4.1 3.5 - 5.0 g/dL   AST 12 (L) 15 - 41 U/L   ALT 9 0 - 44 U/L   Alkaline Phosphatase 68 38 - 126 U/L   Total Bilirubin 0.4 0.3 - 1.2 mg/dL   GFR, Estimated >60 >60 mL/min    Comment: (NOTE) Calculated using the CKD-EPI Creatinine Equation (2021)    Anion gap 10 5 - 15    Comment: Performed at [facility], [place] [address] [place], [place] [address]  Troponin I (High Sensitivity)     Status: None   Collection Time: 10/11/21  1:30 PM  Result Value Ref Range   Troponin I (High Sensitivity) <2 <18 ng/L    Comment: (NOTE) Elevated high sensitivity troponin I (hsTnI) values and significant  changes across serial measurements may suggest ACS but many other  chronic and acute conditions are known to elevate hsTnI results.  Refer to the "Links" section for chest pain algorithms and additional  guidance. Performed at [facility], [place] [address] [place], [place] [address]   Lactic acid, plasma     Status: None   Collection Time: 10/11/21  1:30 PM  Result Value Ref Range   Lactic Acid, Venous 0.9 0.5 - 1.9 mmol/L    Comment: Performed at [facility], [place] [address], [place], [place] [address]  Lipase, blood     Status: None   Collection Time: 10/11/21  1:30 PM  Result Value Ref Range   Lipase 28 11 - 51 U/L    Comment: Performed at [facility], [place] [address], [place],  [place] [address]  Resp Panel by RT-PCR (Flu A&B, Covid)     Status: None   Collection Time: 10/11/21  2:26 PM   Specimen: Nasopharyngeal(NP) swabs in vial transport medium  Result Value Ref Range  SARS Coronavirus 2 by RT PCR NEGATIVE NEGATIVE    Comment: (NOTE) SARS-CoV-2 target nucleic acids are NOT DETECTED.  The SARS-CoV-2 RNA is generally detectable in upper respiratory specimens during the acute phase of infection. The lowest concentration of SARS-CoV-2 viral copies this assay can detect is 138 copies/mL. A negative result does not preclude SARS-Cov-2 infection and should not be used as the sole basis for treatment or other patient management decisions. A negative result may occur with  improper specimen collection/handling, submission of specimen other than nasopharyngeal swab, presence of viral mutation(s) within the areas targeted by this assay, and inadequate number of viral copies(<138 copies/mL). A negative result must be combined with clinical observations, patient history, and epidemiological information. The expected result is Negative.  Fact Sheet for Patients:  EntrepreneurPulse.com.au  Fact Sheet for Healthcare Providers:  IncredibleEmployment.be  This test is no t yet approved or cleared by the [place] FDA and  has been authorized for detection and/or diagnosis of SARS-CoV-2 by FDA under an Emergency Use Authorization (EUA). This EUA will remain  in effect (meaning this test can be used) for the duration of the COVID-19 declaration under Section 564(b)(1) of the Act, 21 U.S.C.section 360bbb-3(b)(1), unless the authorization is terminated  or revoked sooner.       Influenza A by PCR NEGATIVE NEGATIVE   Influenza B by PCR NEGATIVE NEGATIVE    Comment: (NOTE) The Xpert Xpress SARS-CoV-2/FLU/RSV plus assay is intended as an aid in the diagnosis of influenza from Nasopharyngeal swab specimens and should not be used as a sole  basis for treatment. Nasal washings and aspirates are unacceptable for Xpert Xpress SARS-CoV-2/FLU/RSV testing.  Fact Sheet for Patients: EntrepreneurPulse.com.au  Fact Sheet for Healthcare Providers: IncredibleEmployment.be  This test is not yet approved or cleared by the [place] FDA and has been authorized for detection and/or diagnosis of SARS-CoV-2 by FDA under an Emergency Use Authorization (EUA). This EUA will remain in effect (meaning this test can be used) for the duration of the COVID-19 declaration under Section 564(b)(1) of the Act, 21 U.S.C. section 360bbb-3(b)(1), unless the authorization is terminated or revoked.  Performed at [facility], [place] [address] [place], [place] [address]   Lactic acid, plasma     Status: None   Collection Time: 10/11/21  8:20 PM  Result Value Ref Range   Lactic Acid, Venous 0.8 0.5 - 1.9 mmol/L    Comment: Performed at [facility], [place] [address], [place], [place] [address]  Troponin I (High Sensitivity)     Status: None   Collection Time: 10/11/21  8:20 PM  Result Value Ref Range   Troponin I (High Sensitivity) <2 <18 ng/L    Comment: (NOTE) Elevated high sensitivity troponin I (hsTnI) values and significant  changes across serial measurements may suggest ACS but many other  chronic and acute conditions are known to elevate hsTnI results.  Refer to the "Links" section for chest pain algorithms and additional  guidance. Performed at [facility], [place] [address], [place], [place] [address]    CT Soft Tissue Neck W Contrast  Result Date: 10/11/2021 CLINICAL DATA:  Neck mass, initial workup EXAM: CT NECK WITH CONTRAST TECHNIQUE: Multidetector CT imaging of the neck was performed using the standard protocol following the bolus administration of intravenous contrast. CONTRAST:  76mL OMNIPAQUE IOHEXOL 350 MG/ML SOLN COMPARISON:  CT neck  July 22, 2019 FINDINGS: Pharynx and larynx: Retropharyngeal and hypopharyngeal edema. Retropharyngeal edema extends from the  craniocervical junction inferiorly to approximately the C5-C6 level, measuring up to 6 mm in thickness. There is some mass effect on the supraglottic airway, which remains patent. Salivary glands: No inflammation, mass, or stone. Thyroid: Normal. Lymph nodes: Mildly prominent right supraclavicular and cervical chain nodes, probably reactive given low findings. Vascular/soft tissues: Thrombosis of the right internal jugular vein from the C5 level inferiorly. At the level of the thyroid gland there is an adjacent abscess that is likely contiguous with thrombosed internal jugular vein; however, the vein is not well visualized in this region. This abscess is irregular and measures approximately 6 2.3 x 1.8 x 2.6 cm (series 5, image 92; series 9, image 79). There is extensive surrounding edema/soft tissue thickening with asymmetric enlargement of the adjacent sternocleidomastoid, compatible with cellulitis/phlegmon and myositis. Also, in this vicinity there are 3 linear foreign bodies, probably needles from IV drug use (annotated on series 5, images 83, 95, and 98). These needles are chronic and present on prior CT neck 2020. The left internal jugular vein is not well visualized. Limited intracranial: Negative. Visualized orbits: Negative. Mastoids and visualized paranasal sinuses: Mild paranasal sinus mucosal thickening. No mastoid effusions. Skeleton: No evidence of acute abnormality. Upper chest: Evaluated on concurrent CT chest. IMPRESSION: 1. Findings compatible with right internal jugular vein thrombophlebitis with extensive surrounding cellulitis/phlegmon and abscess in the right lower neck at the level of the thyroid, as detailed above. Surrounding spread of infection with retropharyngeal and hypopharyngeal edema and overlying sternocleidomastoid myositis. 2. Multiple chronic linear  foreign bodies in the right lower neck, probably retained needles from IV drug use. 3. The left internal jugular vein is not well visualized, potentially chronically thrombosed or postsurgical given findings on prior CT neck. Electronically Signed   By: Margaretha Sheffield M.D.   On: 10/11/2021 16:24   CT Angio Chest PE W and/or Wo Contrast  Result Date: 10/11/2021 CLINICAL DATA:  IV drug use; PE suspected, high prob History of IJ thrombus, concern for recurrence but also concern for pulmonary embolism or SVC syndrome. EXAM: CT ANGIOGRAPHY CHEST WITH CONTRAST TECHNIQUE: Multidetector CT imaging of the chest was performed using the standard protocol during bolus administration of intravenous contrast. Multiplanar CT image reconstructions and MIPs were obtained to evaluate the vascular anatomy. CONTRAST:  38mL OMNIPAQUE IOHEXOL 350 MG/ML SOLN COMPARISON:  July 24, 2019. FINDINGS: Cardiovascular: Satisfactory opacification of the pulmonary arteries to the segmental level. No evidence of pulmonary embolism. Normal heart size. No pericardial effusion. Mediastinum/Nodes: There are several linear metallic densities overlapping the RIGHT neck likely reflecting retained needle fragments from IV drug use (series 6, image 26; series 6, image 30; series 6, image 39).; Please see separately dictated report regarding neck findings. Soft tissue the anterior mediastinum likely reflecting residual thymus. No axillary adenopathy. Mildly prominent RIGHT paratracheal lymph node measures 8 mm in the short axis and is likely reactive in etiology (series 4, image 54). Lungs/Pleura: Bibasilar atelectasis. No pleural effusion or pneumothorax. Upper Abdomen: No acute abnormality. Musculoskeletal: No acute osseous abnormality. Review of the MIP images confirms the above findings. IMPRESSION: 1.  No acute pulmonary embolism 2. There are several linear metallic densities overlapping the RIGHT neck likely reflecting retained needle  fragments from IV drug use; Please see separately dictated report regarding neck findings. Electronically Signed   By: Valentino Saxon M.D.   On: 10/11/2021 16:11    Pending Labs Unresulted Labs (From admission, onward)     Start     Ordered   10/12/21  0500  CBC  Daily,   R      10/11/21 1859   10/12/21 0500  Comprehensive metabolic panel  Tomorrow morning,   R        10/11/21 1913   10/12/21 0500  Protime-INR  Tomorrow morning,   R        10/11/21 1913   10/12/21 0200  Heparin level (unfractionated)  Once-Timed,   TIMED        10/11/21 1859   10/11/21 1939  Urine rapid drug screen (hosp performed)  ONCE - STAT,   STAT        10/11/21 1939   10/11/21 1911  Culture, blood (routine x 2)  (COPD / Pneumonia / Cellulitis / Lower Extremity Wound)  BLOOD CULTURE X 2,   R (with TIMED occurrences)     Comments: for severe disease only    10/11/21 1913   10/11/21 1909  HIV Antibody (routine testing w rflx)  (HIV Antibody (Routine testing w reflex) panel)  Once,   R        10/11/21 1913   10/11/21 1330  Blood culture (routine x 2)  BLOOD CULTURE X 2,   STAT      10/11/21 1330            Vitals/Pain Today's Vitals   10/11/21 2000 10/11/21 2048 10/11/21 2051 10/11/21 2100  BP: 93/62 119/79  119/79  Pulse: 96 98  93  Resp: (!) 21 16  13   Temp:      TempSrc:      SpO2: 97% 99%  94%  Weight:      Height:      PainSc:   8      Isolation Precautions No active isolations  Medications Medications  metroNIDAZOLE (FLAGYL) IVPB 500 mg (0 mg Intravenous Stopped 10/11/21 1755)  vancomycin (VANCOREADY) IVPB 1500 mg/300 mL (1,500 mg Intravenous New Bag/Given 10/11/21 2053)  dexamethasone (DECADRON) injection 8 mg (8 mg Intravenous Given 10/11/21 1825)  heparin ADULT infusion 100 units/mL (25000 units/247mL) (1,400 Units/hr Intravenous New Bag/Given 10/11/21 2055)  topiramate (TOPAMAX) tablet 100 mg (has no administration in time range)  levETIRAcetam (KEPPRA) tablet 750 mg (has no  administration in time range)  acetaminophen (TYLENOL) tablet 650 mg (has no administration in time range)    Or  acetaminophen (TYLENOL) suppository 650 mg (has no administration in time range)  HYDROmorphone (DILAUDID) injection 0.5-1 mg (1 mg Intravenous Given 10/11/21 2051)  ondansetron (ZOFRAN) tablet 4 mg (has no administration in time range)    Or  ondansetron (ZOFRAN) injection 4 mg (has no administration in time range)  0.9 %  sodium chloride infusion ( Intravenous New Bag/Given 10/11/21 2049)  levofloxacin (LEVAQUIN) IVPB 750 mg (has no administration in time range)  vancomycin (VANCOREADY) IVPB 1500 mg/300 mL (has no administration in time range)  morphine 4 MG/ML injection 4 mg (4 mg Intravenous Given 10/11/21 1428)  ondansetron (ZOFRAN) injection 4 mg (4 mg Intravenous Given 10/11/21 1427)  iohexol (OMNIPAQUE) 350 MG/ML injection 80 mL (80 mLs Intravenous Contrast Given 10/11/21 1538)  morphine 4 MG/ML injection 4 mg (4 mg Intravenous Given 10/11/21 1651)  levofloxacin (LEVAQUIN) IVPB 750 mg (0 mg Intravenous Stopped 10/11/21 1956)  heparin bolus via infusion 3,000 Units (3,000 Units Intravenous Bolus from Bag 10/11/21 2055)    Mobility walks Low fall risk   Focused Assessments    R Recommendations: See Admitting Provider Note  Report given to:   Additional Notes:

## 2021-10-12 ENCOUNTER — Encounter (HOSPITAL_COMMUNITY): Payer: Self-pay | Admitting: Internal Medicine

## 2021-10-12 ENCOUNTER — Other Ambulatory Visit: Payer: Self-pay

## 2021-10-12 ENCOUNTER — Inpatient Hospital Stay (HOSPITAL_COMMUNITY): Payer: Medicaid Other

## 2021-10-12 DIAGNOSIS — I808 Phlebitis and thrombophlebitis of other sites: Secondary | ICD-10-CM | POA: Diagnosis not present

## 2021-10-12 DIAGNOSIS — F1121 Opioid dependence, in remission: Secondary | ICD-10-CM | POA: Diagnosis not present

## 2021-10-12 DIAGNOSIS — L0211 Cutaneous abscess of neck: Secondary | ICD-10-CM | POA: Diagnosis not present

## 2021-10-12 DIAGNOSIS — M549 Dorsalgia, unspecified: Secondary | ICD-10-CM

## 2021-10-12 DIAGNOSIS — Z8673 Personal history of transient ischemic attack (TIA), and cerebral infarction without residual deficits: Secondary | ICD-10-CM

## 2021-10-12 DIAGNOSIS — F17210 Nicotine dependence, cigarettes, uncomplicated: Secondary | ICD-10-CM | POA: Diagnosis not present

## 2021-10-12 DIAGNOSIS — G40909 Epilepsy, unspecified, not intractable, without status epilepticus: Secondary | ICD-10-CM

## 2021-10-12 DIAGNOSIS — Z833 Family history of diabetes mellitus: Secondary | ICD-10-CM | POA: Diagnosis not present

## 2021-10-12 DIAGNOSIS — I82C11 Acute embolism and thrombosis of right internal jugular vein: Secondary | ICD-10-CM | POA: Diagnosis not present

## 2021-10-12 DIAGNOSIS — I38 Endocarditis, valve unspecified: Secondary | ICD-10-CM

## 2021-10-12 DIAGNOSIS — S1095XA Superficial foreign body of unspecified part of neck, initial encounter: Secondary | ICD-10-CM | POA: Diagnosis not present

## 2021-10-12 DIAGNOSIS — Z800 Family history of malignant neoplasm of digestive organs: Secondary | ICD-10-CM

## 2021-10-12 LAB — ECHOCARDIOGRAM COMPLETE
Area-P 1/2: 2.87 cm2
Calc EF: 54.9 %
Height: 66 in
S' Lateral: 3.4 cm
Single Plane A2C EF: 65.9 %
Single Plane A4C EF: 47.1 %
Weight: 2720 oz

## 2021-10-12 LAB — CBC
HCT: 35.5 % — ABNORMAL LOW (ref 39.0–52.0)
Hemoglobin: 11.9 g/dL — ABNORMAL LOW (ref 13.0–17.0)
MCH: 28.5 pg (ref 26.0–34.0)
MCHC: 33.5 g/dL (ref 30.0–36.0)
MCV: 84.9 fL (ref 80.0–100.0)
Platelets: 319 10*3/uL (ref 150–400)
RBC: 4.18 MIL/uL — ABNORMAL LOW (ref 4.22–5.81)
RDW: 11.9 % (ref 11.5–15.5)
WBC: 7.3 10*3/uL (ref 4.0–10.5)
nRBC: 0 % (ref 0.0–0.2)

## 2021-10-12 LAB — HEPARIN LEVEL (UNFRACTIONATED): Heparin Unfractionated: 0.41 IU/mL (ref 0.30–0.70)

## 2021-10-12 LAB — C-REACTIVE PROTEIN: CRP: 9 mg/dL — ABNORMAL HIGH (ref <1.0)

## 2021-10-12 LAB — SEDIMENTATION RATE: Sed Rate: 74 mm/hr — ABNORMAL HIGH (ref 0–16)

## 2021-10-12 LAB — HIV ANTIBODY (ROUTINE TESTING W REFLEX): HIV Screen 4th Generation wRfx: NONREACTIVE

## 2021-10-12 LAB — MRSA NEXT GEN BY PCR, NASAL: MRSA by PCR Next Gen: NOT DETECTED

## 2021-10-12 MED ORDER — DEXAMETHASONE SODIUM PHOSPHATE 10 MG/ML IJ SOLN
8.0000 mg | Freq: Three times a day (TID) | INTRAMUSCULAR | Status: AC
  Administered 2021-10-12 – 2021-10-13 (×3): 8 mg via INTRAVENOUS
  Filled 2021-10-12 (×3): qty 0.8

## 2021-10-12 MED ORDER — BUPRENORPHINE HCL-NALOXONE HCL 8-2 MG SL SUBL
1.0000 | SUBLINGUAL_TABLET | Freq: Two times a day (BID) | SUBLINGUAL | Status: DC
  Filled 2021-10-12: qty 1

## 2021-10-12 MED ORDER — CYCLOBENZAPRINE HCL 10 MG PO TABS
5.0000 mg | ORAL_TABLET | Freq: Three times a day (TID) | ORAL | Status: DC | PRN
  Administered 2021-10-12 – 2021-10-15 (×6): 5 mg via ORAL
  Filled 2021-10-12 (×6): qty 1

## 2021-10-12 MED ORDER — BUPRENORPHINE HCL-NALOXONE HCL 8-2 MG SL SUBL
1.0000 | SUBLINGUAL_TABLET | Freq: Every day | SUBLINGUAL | Status: DC
Start: 2021-10-12 — End: 2021-10-12

## 2021-10-12 MED ORDER — HYDROMORPHONE HCL 1 MG/ML IJ SOLN
1.0000 mg | INTRAMUSCULAR | Status: DC | PRN
  Administered 2021-10-12 – 2021-10-13 (×5): 1 mg via INTRAVENOUS
  Filled 2021-10-12 (×5): qty 1

## 2021-10-12 MED ORDER — HYDROMORPHONE HCL 1 MG/ML IJ SOLN: 0.5000 mg | INTRAMUSCULAR | Status: DC | PRN

## 2021-10-12 MED ORDER — OXYCODONE HCL 5 MG PO TABS
5.0000 mg | ORAL_TABLET | Freq: Four times a day (QID) | ORAL | Status: DC | PRN
Start: 2021-10-12 — End: 2021-10-12

## 2021-10-12 MED ORDER — HYDROMORPHONE HCL 1 MG/ML IJ SOLN: 1.0000 mg | INTRAMUSCULAR | Status: DC | PRN

## 2021-10-12 NOTE — Assessment & Plan Note (Addendum)
--   extensive cellulitis/phlegmon and abscess in the right lower neck at the level of the thyroid, surrounding spread of infection with retropharyngeal and hypopharyngeal edema and overlying sternocleidomastoid myositis, with associated right internal jugular vein thrombophlebitis with -- Per ENT conservative management with antibiotics, also followed by infectious disease --Modest improvement if any.  Plan for CT tomorrow if no clinical improvement and reconsultation with ENT.  Discussed with interventional radiology today, they request ENT reevaluation prior to proceeding with any aspiration.  Reevaluate tomorrow. --Patient refusing Suboxone, will continue Dilaudid for as needed breakthrough pain but add oral medication as well.   -- Consider PICC when blood cultures negative at least 72 hours.

## 2021-10-12 NOTE — Progress Notes (Addendum)
[facility] for IV heparin Indication: septic thrombophlebitis  Allergies  Allergen Reactions   Amoxicillin Other (See Comments)    Steven-Johnson's Syndrome   Ceclor [Cefaclor] Other (See Comments)    Steven-Johnson's Syndrome   Erythromycin Other (See Comments)    Steven-Johnson's Syndrome   Penicillins Other (See Comments)    Steven-Johnson's Syndrome   Septra [Sulfamethoxazole-Trimethoprim] Other (See Comments)    Steven-Johnson's Syndrome   Tylenol [Acetaminophen] Other (See Comments)    Unknown reaction - doctor told pt not to take    Patient Measurements: Height: 5\' 6"  (167.6 cm) Weight: 77.1 kg (170 lb) IBW/kg (Calculated) : 63.8 Heparin Dosing Weight: TBW  Vital Signs: Temp: 97.6 F (36.4 C) (11/14 0742) Temp Source: Oral (11/14 0742) BP: 115/70 (11/14 0742) Pulse Rate: 87 (11/14 0742)  Labs: Recent Labs    10/11/21 1330 10/11/21 2020 10/12/21 0757  HGB 12.3*  --  11.9*  HCT 37.4*  --  35.5*  PLT 309  --  319  HEPARINUNFRC  --   --  0.41  CREATININE 0.62  --   --   TROPONINIHS <2 <2  --      Estimated Creatinine Clearance: 130.8 mL/min (by C-G formula based on SCr of 0.62 mg/dL).   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   10/10/2021 at pm   cyclobenzaprine (FLEXERIL) 5 MG tablet Take 1 tablet at night as needed for headache/neck pain (Patient taking differently: Take 5 mg by mouth at bedtime as needed (headache/neck pain).) 30 tablet 11 10/10/2021 at pm   ibuprofen (ADVIL,MOTRIN) 200 MG tablet Take 1,200 mg by mouth 3 (three) times  daily as needed for headache.   few weeks ago   levETIRAcetam (KEPPRA) 500 MG tablet Take 1 and 1/2 tablets twice a day (Patient taking differently: Take 750 mg by mouth 2 (two) times daily.) 270 tablet 3 10/11/2021 at am   topiramate (TOPAMAX) 100 MG tablet Take 1 tablet (100 mg total) by mouth 2 (two) times daily. 180 tablet 3 10/10/2021 at pm   Scheduled:   dexamethasone (DECADRON) injection  8 mg Intravenous Q8H   levETIRAcetam  750 mg Oral BID   topiramate  100 mg Oral BID   Infusions:   sodium chloride 75 mL/hr at 10/11/21 2049   heparin 1,400 Units/hr (10/12/21 0544)   levofloxacin (LEVAQUIN) IV     metronidazole Stopped (10/12/21 0425)   vancomycin      Assessment: 45 yoM with PMH IVDU including injection into neck, admitted with septic thrombophlebitis of L jugular vein. Previous episode of same in Aug 2020, with patient noted to require high doses of heparin. ATIII level checked during that admission and found to be borderline low (75%).  Baseline INR, aPTT: not done; no PTA antithrombotics Prior anticoagulation: none  Heparin level came back therapeutic today along with his hgb and plt. We will continue with the same rate and f/u with oral anticoagulation plan if needed.  Goal of Therapy: Heparin level 0.3-0.7 units/ml Monitor platelets by anticoagulation protocol: Yes  Plan:  Continue heparin  at 1400 units/hr Daily heparin level and CBC F/u with oral anticoagulation plan  Onnie Boer, PharmD, BCIDP, AAHIVP, CPP Infectious Disease Pharmacist 10/12/2021 9:19 AM

## 2021-10-12 NOTE — Assessment & Plan Note (Addendum)
--   Acute on chronic low back pain.  No neurologic complicating features suspected.  As may need CT neck tomorrow, will hold off today but would consider low back imaging in the near future unless pain resolves.  Given needles present, avoid MRI.

## 2021-10-12 NOTE — Consult Note (Signed)
Hospital Consult    Reason for Consult: Right IJ DVT Referring Physician: Dr. Sarajane Jews MRN #:  [id number]  History of Present Illness: This is a 29 y.o. male history of left neck abscess that required drainage.  He states at that time he had a DVT that was treated with Eliquis as an outpatient.  In the past he also cannulated his right IJ for IV drug use.  He now presents with few day history of right neck pain and swelling.  He did have facial swelling but since arrival he states this has resolved.  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   Seizure [facility])     Past Surgical History:  Procedure Laterality Date   INCISION AND DRAINAGE ABSCESS Left 07/25/2019   Procedure: INCISION AND DRAINAGE NECK ABSCESS;  Surgeon: Jerrell Belfast, MD;  Location: [place];  Service: ENT;  Laterality: Left;   TYMPANOSTOMY TUBE PLACEMENT     62-49 years old    Allergies  Allergen Reactions   Amoxicillin Other (See Comments)    Steven-Johnson's Syndrome   Ceclor [Cefaclor] Other (See Comments)    Steven-Johnson's Syndrome   Erythromycin Other (See Comments)    Steven-Johnson's Syndrome   Penicillins Other (See Comments)    Steven-Johnson's Syndrome   Septra [Sulfamethoxazole-Trimethoprim] Other (See Comments)    Steven-Johnson's Syndrome   Tylenol [Acetaminophen] Other (See Comments)    Unknown reaction - doctor told pt not to take    Prior to Admission medications   Medication Sig Start Date End Date Taking? Authorizing Provider  Buprenorphine HCl-Naloxone HCl 8-2 MG FILM Place 1 Film under the tongue 2 (two) times daily.   Yes [provider]  cyclobenzaprine (FLEXERIL) 5 MG tablet Take 1 tablet at night as needed for headache/neck pain Patient taking differently: Take 5 mg by mouth at bedtime as needed (headache/neck pain).  04/29/21  Yes Cameron Sprang, MD  ibuprofen (ADVIL,MOTRIN) 200 MG tablet Take 1,200 mg by mouth 3 (three) times daily as needed for headache.   Yes [provider]  levETIRAcetam (KEPPRA) 500 MG tablet Take 1 and 1/2 tablets twice a day Patient taking differently: Take 750 mg by mouth 2 (two) times daily. 04/29/21  Yes Cameron Sprang, MD  topiramate (TOPAMAX) 100 MG tablet Take 1 tablet (100 mg total) by mouth 2 (two) times daily. 04/29/21  Yes Cameron Sprang, MD    Social History   Socioeconomic History   Marital status: Married    Spouse name: Not on file   Number of children: 1   Years of education: Not on file   Highest education level: Not on file  Occupational History   Not on file  Tobacco Use   Smoking status: Every Day    Packs/day: 0.00    Years: 5.00    Pack years: 0.00    Types: Cigarettes   Smokeless tobacco: Never  Vaping Use   Vaping Use: Never used  Substance and Sexual Activity   Alcohol use: No   Drug use: Not Currently    Types: Marijuana    Comment: Opiod addiction   Sexual activity: Yes    Partners: Female  Other Topics Concern   Not on file  Social History Narrative   Lives in [place].    Trying to find work.  Right handed      12th grade      1 son has a daughter       Lives with grandmother and his son lives with them      Social Determinants of Health   Financial Resource Strain: Not on file  Food Insecurity: Not on file  Transportation Needs: Not on file  Physical Activity: Not on file  Stress: Not on file  Social Connections: Not on file  Intimate Partner Violence: Not on file     Family History  Problem Relation Age of Onset   Diabetes Other        grandparents   Stroke Other    Pancreatic cancer Other    Diabetes Mellitus II Other     Review of Systems  Constitutional:  Positive for fever and malaise/fatigue.  HENT: Negative.         Facial swelling, neck pain  Eyes: Negative.   Respiratory: Negative.     Cardiovascular: Negative.   Gastrointestinal: Negative.   Musculoskeletal: Negative.   Skin: Negative.   Neurological: Negative.   Endo/Heme/Allergies: Negative.   Psychiatric/Behavioral: Negative.      Physical Examination  Vitals:   10/12/21 0742 10/12/21 1131  BP: 115/70 103/66  Pulse: 87 73  Resp: 18 12  Temp: 97.6 F (36.4 C) (!) 97.4 F (36.3 C)  SpO2: 98% 99%   Body mass index is 27.44 kg/m.  Physical Exam HENT:     Head: Normocephalic.     Nose: Nose normal.  Eyes:     Pupils: Pupils are equal, round, and reactive to light.  Cardiovascular:     Rate and Rhythm: Normal rate.  Pulmonary:     Effort: Pulmonary effort is normal.  Abdominal:     General: Abdomen is flat.  Musculoskeletal:        General: Normal range of motion.     Cervical back: Normal range of motion. Tenderness present.     Right lower leg: No edema.     Left lower leg: No edema.  Skin:    General: Skin is warm.     Capillary Refill: Capillary refill takes less than 2 seconds.  Neurological:     General: No focal deficit present.     Mental Status: He is alert.  Psychiatric:        Mood and Affect: Mood normal.        Behavior: Behavior normal.        Thought Content: Thought content normal.        Judgment: Judgment normal.     CBC    Component Value Date/Time   WBC 7.3 10/12/2021 0757   RBC 4.18 (L) 10/12/2021 0757   HGB 11.9 (L) 10/12/2021 0757   HCT 35.5 (L) 10/12/2021 0757   PLT 319 10/12/2021 0757   MCV 84.9 10/12/2021 0757   MCH 28.5 10/12/2021 0757   MCHC 33.5 10/12/2021 0757   RDW 11.9 10/12/2021 0757   LYMPHSABS 1.5 10/11/2021 1330   MONOABS 0.6 10/11/2021 1330   EOSABS 0.0 10/11/2021 1330   BASOSABS 0.0 10/11/2021 1330    BMET    Component Value Date/Time   NA 137 10/11/2021 1330   K 3.9 10/11/2021 1330   CL 97 (L) 10/11/2021 1330   CO2 30 10/11/2021 1330   GLUCOSE 103 (H) 10/11/2021 1330   BUN 10 10/11/2021 1330   CREATININE 0.62 10/11/2021 1330    CALCIUM 9.2 10/11/2021 1330   GFRNONAA >60 10/11/2021 1330  GFRAA >60 07/28/2019 0740    COAGS: Lab Results  Component Value Date   INR 1.0 07/22/2019     Non-Invasive Vascular Imaging:   CT neck IMPRESSION: 1. Findings compatible with right internal jugular vein thrombophlebitis with extensive surrounding cellulitis/phlegmon and abscess in the right lower neck at the level of the thyroid, as detailed above. Surrounding spread of infection with retropharyngeal and hypopharyngeal edema and overlying sternocleidomastoid myositis. 2. Multiple chronic linear foreign bodies in the right lower neck, probably retained needles from IV drug use. 3. The left internal jugular vein is not well visualized, potentially chronically thrombosed or postsurgical given findings on prior CT neck.     ASSESSMENT/PLAN: This is a 30 y.o. male with right neck abscess and associated right IJ thrombus.  Patient did have facial swelling may have been related to DVT which does appear acute by CT scan there may also be a chronic component given that he has cannulated his veins multiple times in the past.  Either way the best treatment for this would be anticoagulation and would be best served with heparin for now until an operative plan is definitive with ENT.  He can be transitioned back to Eliquis as an outpatient.  Strictly speaking this is his second DVT although both have been provoked due to neck abscesses and given high risk for bleeding he may be best served with 3 to 6 months of anticoagulation after discharge.  Vascular surgery will be available as needed.  Oziel Beitler C. Donzetta Matters, MD [facility] Office: [phone] Pager: [phone]

## 2021-10-12 NOTE — Progress Notes (Signed)
  Echocardiogram 2D Echocardiogram has been performed.  [name] 10/12/2021, 2:48 PM

## 2021-10-12 NOTE — Consult Note (Signed)
ENT CONSULT:  Reason for Consult: Right neck cellulitis/abscess  Referring Physician:  Dr. Sherry Ruffing  HPI: [name] is an 30 y.o. male who presented to [facility] ED yesterday with complaints of 3-day history of right-sided neck pain and swelling.  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  Patient had work-up to include CT scan of the chest and CT neck with contrast.  CT neck demonstrated new internal jugular thrombophlebitis with phlegmon cellulitis and abscess with hypopharyngeal edema and sternocleidomastoid myositis.  New foreign bodies in the right side of the neck were also noted, consistent with needles.  Patient was transferred to [facility] for further evaluation and treatment.  On exam today.  Patient is resting comfortably in bed with no evidence of shortness of breath.  He endorses odynophagia, but states he is hungry and would like to eat.  He denies shortness of breath or difficulty breathing.  He states that he does feel somewhat improved today compared to yesterday.   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 He  has never used smokeless tobacco. He reports that he does not currently use drugs after having used the following drugs: Marijuana. He reports that he does not drink alcohol.  Allergies:  Allergies  Allergen Reactions   Amoxicillin Other (See Comments)    Steven-Johnson's Syndrome   Ceclor [Cefaclor] Other (See Comments)    Steven-Johnson's Syndrome   Erythromycin Other (See Comments)    Steven-Johnson's Syndrome   Penicillins Other (See Comments)    Steven-Johnson's Syndrome   Septra [Sulfamethoxazole-Trimethoprim] Other (See Comments)    Steven-Johnson's Syndrome   Tylenol [Acetaminophen] Other (See Comments)    Unknown reaction - doctor told pt not to take    Medications: I have reviewed the patient's current medications.  Results for orders placed or performed during the hospital encounter of 10/11/21 (from the past 48 hour(s))  CBC with Differential     Status: Abnormal   Collection Time: 10/11/21  1:30 PM  Result Value Ref Range   WBC 9.5 4.0 - 10.5 K/uL   RBC 4.34 4.22 - 5.81 MIL/uL   Hemoglobin 12.3 (L) 13.0 - 17.0 g/dL   HCT 37.4 (L) 39.0 - 52.0 %   MCV 86.2 80.0 - 100.0 fL   MCH 28.3 26.0 - 34.0 pg   MCHC 32.9 30.0 - 36.0 g/dL   RDW 12.2 11.5 - 15.5 %   Platelets 309 150 - 400 K/uL   nRBC 0.0 0.0 - 0.2 %   Neutrophils  Relative % 78 %   Neutro Abs 7.3 1.7 - 7.7 K/uL   Lymphocytes Relative 16 %   Lymphs Abs 1.5 0.7 - 4.0 K/uL   Monocytes Relative 6 %   Monocytes Absolute 0.6 0.1 - 1.0 K/uL   Eosinophils Relative 0 %   Eosinophils Absolute 0.0 0.0 - 0.5 K/uL   Basophils Relative 0 %   Basophils Absolute 0.0 0.0 - 0.1 K/uL   Immature Granulocytes 0 %   Abs Immature Granulocytes 0.02 0.00 - 0.07 K/uL    Comment: Performed at [facility], [place] [address] [place], [place] [address]  Comprehensive metabolic panel     Status: Abnormal   Collection Time: 10/11/21  1:30 PM  Result Value Ref Range   Sodium 137 135 - 145 mmol/L   Potassium 3.9 3.5  - 5.1 mmol/L   Chloride 97 (L) 98 - 111 mmol/L   CO2 30 22 - 32 mmol/L   Glucose, Bld 103 (H) 70 - 99 mg/dL    Comment: Glucose reference range applies only to samples taken after fasting for at least 8 hours.   BUN 10 6 - 20 mg/dL   Creatinine, Ser 0.62 0.61 - 1.24 mg/dL   Calcium 9.2 8.9 - 10.3 mg/dL   Total Protein 7.8 6.5 - 8.1 g/dL   Albumin 4.1 3.5 - 5.0 g/dL   AST 12 (L) 15 - 41 U/L   ALT 9 0 - 44 U/L   Alkaline Phosphatase 68 38 - 126 U/L   Total Bilirubin 0.4 0.3 - 1.2 mg/dL   GFR, Estimated >60 >60 mL/min    Comment: (NOTE) Calculated using the CKD-EPI Creatinine Equation (2021)    Anion gap 10 5 - 15    Comment: Performed at [facility], [place] [address] [place], [place] [address]  Troponin I (High Sensitivity)     Status: None   Collection Time: 10/11/21  1:30 PM  Result Value Ref Range   Troponin I (High Sensitivity) <2 <18 ng/L    Comment: (NOTE) Elevated high sensitivity troponin I (hsTnI) values and significant  changes across serial measurements may suggest ACS but many other  chronic and acute conditions are known to elevate hsTnI results.  Refer to the "Links" section for chest pain algorithms and additional  guidance. Performed at [facility], [place] [address], [place], [place] [address]   Lactic acid, plasma     Status: None   Collection Time: 10/11/21  1:30 PM  Result Value Ref Range   Lactic Acid, Venous 0.9 0.5 - 1.9 mmol/L    Comment: Performed at [facility], [place] [address], [place], [place] [address]  Lipase, blood     Status: None   Collection Time: 10/11/21  1:30 PM  Result Value Ref Range   Lipase 28 11 - 51 U/L    Comment: Performed at [facility], [place] [address], [place], [place] [address]  Blood culture (routine x 2)     Status: None (Preliminary result)   Collection Time: 10/11/21  2:15 PM   Specimen: BLOOD  Result Value Ref Range   Specimen Description       BLOOD RIGHT ANTECUBITAL Performed at [facility] [address] [place], [place] [address]    Special Requests      BOTTLES DRAWN AEROBIC AND ANAEROBIC Blood Culture adequate volume Performed at [facility] [address], [place], [place] [address]  Culture      NO GROWTH < 12 HOURS Performed at [facility] [address] [place], [place] [address]    Report Status PENDING   Resp Panel by RT-PCR (Flu A&B, Covid)     Status: None   Collection Time: 10/11/21  2:26 PM   Specimen: Nasopharyngeal(NP) swabs in vial transport medium  Result Value Ref Range   SARS Coronavirus 2 by RT PCR NEGATIVE NEGATIVE    Comment: (NOTE) SARS-CoV-2 target nucleic acids are NOT DETECTED.  The SARS-CoV-2 RNA is generally detectable in upper respiratory specimens during the acute phase of infection. The lowest concentration of SARS-CoV-2 viral copies this assay can detect is 138 copies/mL. A negative result does not preclude SARS-Cov-2 infection and should not be used as the sole basis for treatment or other patient management decisions. A negative result may occur with  improper specimen collection/handling, submission of specimen other than nasopharyngeal swab, presence of viral mutation(s) within the areas targeted by this assay, and inadequate number of viral copies(<138 copies/mL). A negative result must be combined with clinical observations, patient history, and epidemiological information. The expected result is Negative.  Fact Sheet for Patients:  EntrepreneurPulse.com.au  Fact Sheet for Healthcare Providers:  IncredibleEmployment.be  This test is no t yet approved or cleared by the [place] FDA and  has been authorized for detection and/or diagnosis of SARS-CoV-2 by FDA under an Emergency Use Authorization (EUA). This EUA will remain  in effect (meaning this test can be used) for the duration of  the COVID-19 declaration under Section 564(b)(1) of the Act, 21 U.S.C.section 360bbb-3(b)(1), unless the authorization is terminated  or revoked sooner.       Influenza A by PCR NEGATIVE NEGATIVE   Influenza B by PCR NEGATIVE NEGATIVE    Comment: (NOTE) The Xpert Xpress SARS-CoV-2/FLU/RSV plus assay is intended as an aid in the diagnosis of influenza from Nasopharyngeal swab specimens and should not be used as a sole basis for treatment. Nasal washings and aspirates are unacceptable for Xpert Xpress SARS-CoV-2/FLU/RSV testing.  Fact Sheet for Patients: EntrepreneurPulse.com.au  Fact Sheet for Healthcare Providers: IncredibleEmployment.be  This test is not yet approved or cleared by the [place] FDA and has been authorized for detection and/or diagnosis of SARS-CoV-2 by FDA under an Emergency Use Authorization (EUA). This EUA will remain in effect (meaning this test can be used) for the duration of the COVID-19 declaration under Section 564(b)(1) of the Act, 21 U.S.C. section 360bbb-3(b)(1), unless the authorization is terminated or revoked.  Performed at [facility] [place] [address], [place], [place] [address]   Lactic acid, plasma     Status: None   Collection Time: 10/11/21  8:20 PM  Result Value Ref Range   Lactic Acid, Venous 0.8 0.5 - 1.9 mmol/L    Comment: Performed at [facility], [place] [address], [place], [place] [address]  Troponin I (High Sensitivity)     Status: None   Collection Time: 10/11/21  8:20 PM  Result Value Ref Range   Troponin I (High Sensitivity) <2 <18 ng/L    Comment: (NOTE) Elevated high sensitivity troponin I (hsTnI) values and significant  changes across serial measurements may suggest ACS but many other  chronic and acute conditions are known to elevate hsTnI results.  Refer to the "Links" section for chest pain algorithms and additional  guidance. Performed at  [facility], [place] [address] [place], [place] [address]   Heparin level (  unfractionated)     Status: None   Collection Time: 10/12/21  7:57 AM  Result Value Ref Range   Heparin Unfractionated 0.41 0.30 - 0.70 IU/mL    Comment: (NOTE) The clinical reportable range upper limit is being lowered to >1.10 to align with the FDA approved guidance for the current laboratory assay.  If heparin results are below expected values, and patient dosage has  been confirmed, suggest follow up testing of antithrombin III levels. Performed at [facility], [place] [address], [place], [place] [address]   CBC     Status: Abnormal   Collection Time: 10/12/21  7:57 AM  Result Value Ref Range   WBC 7.3 4.0 - 10.5 K/uL   RBC 4.18 (L) 4.22 - 5.81 MIL/uL   Hemoglobin 11.9 (L) 13.0 - 17.0 g/dL   HCT 35.5 (L) 39.0 - 52.0 %   MCV 84.9 80.0 - 100.0 fL   MCH 28.5 26.0 - 34.0 pg   MCHC 33.5 30.0 - 36.0 g/dL   RDW 11.9 11.5 - 15.5 %   Platelets 319 150 - 400 K/uL   nRBC 0.0 0.0 - 0.2 %    Comment: Performed at [facility], [place] [address], [place], [place] [address]    CT Soft Tissue Neck W Contrast  Result Date: 10/11/2021 CLINICAL DATA:  Neck mass, initial workup EXAM: CT NECK WITH CONTRAST TECHNIQUE: Multidetector CT imaging of the neck was performed using the standard protocol following the bolus administration of intravenous contrast. CONTRAST:  62mL OMNIPAQUE IOHEXOL 350 MG/ML SOLN COMPARISON:  CT neck July 22, 2019 FINDINGS: Pharynx and larynx: Retropharyngeal and hypopharyngeal edema. Retropharyngeal edema extends from the craniocervical junction inferiorly to approximately the C5-C6 level, measuring up to 6 mm in thickness. There is some mass effect on the supraglottic airway, which remains patent. Salivary glands: No inflammation, mass, or stone. Thyroid: Normal. Lymph nodes: Mildly prominent right supraclavicular and cervical chain nodes, probably reactive given low  findings. Vascular/soft tissues: Thrombosis of the right internal jugular vein from the C5 level inferiorly. At the level of the thyroid gland there is an adjacent abscess that is likely contiguous with thrombosed internal jugular vein; however, the vein is not well visualized in this region. This abscess is irregular and measures approximately 6 2.3 x 1.8 x 2.6 cm (series 5, image 92; series 9, image 79). There is extensive surrounding edema/soft tissue thickening with asymmetric enlargement of the adjacent sternocleidomastoid, compatible with cellulitis/phlegmon and myositis. Also, in this vicinity there are 3 linear foreign bodies, probably needles from IV drug use (annotated on series 5, images 83, 95, and 98). These needles are chronic and present on prior CT neck 2020. The left internal jugular vein is not well visualized. Limited intracranial: Negative. Visualized orbits: Negative. Mastoids and visualized paranasal sinuses: Mild paranasal sinus mucosal thickening. No mastoid effusions. Skeleton: No evidence of acute abnormality. Upper chest: Evaluated on concurrent CT chest. IMPRESSION: 1. Findings compatible with right internal jugular vein thrombophlebitis with extensive surrounding cellulitis/phlegmon and abscess in the right lower neck at the level of the thyroid, as detailed above. Surrounding spread of infection with retropharyngeal and hypopharyngeal edema and overlying sternocleidomastoid myositis. 2. Multiple chronic linear foreign bodies in the right lower neck, probably retained needles from IV drug use. 3. The left internal jugular vein is not well visualized, potentially chronically thrombosed or postsurgical given findings on prior CT neck. Electronically Signed   By: Margaretha Sheffield M.D.   On: 10/11/2021 16:24  CT Angio Chest PE W and/or Wo Contrast  Result Date: 10/11/2021 CLINICAL DATA:  IV drug use; PE suspected, high prob History of IJ thrombus, concern for recurrence but also  concern for pulmonary embolism or SVC syndrome. EXAM: CT ANGIOGRAPHY CHEST WITH CONTRAST TECHNIQUE: Multidetector CT imaging of the chest was performed using the standard protocol during bolus administration of intravenous contrast. Multiplanar CT image reconstructions and MIPs were obtained to evaluate the vascular anatomy. CONTRAST:  6mL OMNIPAQUE IOHEXOL 350 MG/ML SOLN COMPARISON:  July 24, 2019. FINDINGS: Cardiovascular: Satisfactory opacification of the pulmonary arteries to the segmental level. No evidence of pulmonary embolism. Normal heart size. No pericardial effusion. Mediastinum/Nodes: There are several linear metallic densities overlapping the RIGHT neck likely reflecting retained needle fragments from IV drug use (series 6, image 26; series 6, image 30; series 6, image 39).; Please see separately dictated report regarding neck findings. Soft tissue the anterior mediastinum likely reflecting residual thymus. No axillary adenopathy. Mildly prominent RIGHT paratracheal lymph node measures 8 mm in the short axis and is likely reactive in etiology (series 4, image 54). Lungs/Pleura: Bibasilar atelectasis. No pleural effusion or pneumothorax. Upper Abdomen: No acute abnormality. Musculoskeletal: No acute osseous abnormality. Review of the MIP images confirms the above findings. IMPRESSION: 1.  No acute pulmonary embolism 2. There are several linear metallic densities overlapping the RIGHT neck likely reflecting retained needle fragments from IV drug use; Please see separately dictated report regarding neck findings. Electronically Signed   By: Valentino Saxon M.D.   On: 10/11/2021 16:11    ROS:ROS  Blood pressure 103/66, pulse 73, temperature (!) 97.4 F (36.3 C), temperature source Oral, resp. rate 12, height 5\' 6"  (1.676 m), weight 77.1 kg, SpO2 99 %.  PHYSICAL EXAM: CONSTITUTIONAL: Non-toxic appearing, no acute distress and alert and oriented x 3 PULMONARY/CHEST WALL: effort normal and no  stridor, no stertor, no dysphonia HENT: Head : normocephalic and atraumatic Nose: nose normal Mouth/Throat:  Mouth: uvula midline and no oral lesions Throat: oropharynx clear and moist EYES: conjunctiva normal, EOM normal and PERRL NECK: Right neck palpably indurated over the sternocleidomastoid and levels 3 and 4.  There is no palpable fluctuance.  No significant erythema of the overlying skin.   Studies Reviewed: CT neck with contrast personally reviewed.    Assessment/Plan: [name] is a 30 y/o M with history of IV drug use, previous left sided neck abscess and internal jugular thrombophlebitis,  seizures, Stevens-Johnson syndrome, hepatitis C. -CT neck with contrast demonstrates an approximate 2.5 cm abscess in the right neck with surrounding cellulitis and myositis of the right sternocleidomastoid muscle as well as thrombophlebitis of the right internal jugular vein. -Exam demonstrates  induration of the right neck with no palpable fluctuance.  Patient is stable, denies shortness of breath, tolerating secretions, asking for diet -Recommend continued conservative management with IV Decadron 8 mg every 8 hours for 3 additional doses, IV antibiotics as per infectious disease/pharmacy due to history of SJS -Anticoagulation as per vascular -Okay for diet from ENT perspective at this time, recommend SLP evaluation to ensure safe swallow -If no significant clinical improvement in 48 hours, consider repeat CT neck with contrast   Jason Coop, [facility]   10/12/2021, 12:10 PM

## 2021-10-12 NOTE — Assessment & Plan Note (Addendum)
--   CT showed right internal jugular vein thrombophlebitis with extensive surrounding cellulitis/phlegmon -- Anticoagulation per vascular surgery.  Transition to oral after any procedures complete.  Follow-up as an outpatient.

## 2021-10-12 NOTE — Assessment & Plan Note (Signed)
--  Multiple chronic linear foreign bodies in the right lower neck, probably retained needles from IV drug use.  Management per ENT.

## 2021-10-12 NOTE — Progress Notes (Addendum)
Progress Note [name]   KCL:[id number]  DOB: 1991/08/15  DOA: 10/11/2021     1 Date of Service: 10/12/2021   Clinical Course 30 year old man PMH IV drug use (remission 2 years by report), opioid use disorder on Suboxone, Stevens-Johnson syndrome with penicillin, left IJ thrombosis and neck abscess August 2020, off apixaban 1 to 2 years, presented with right-sided neck pain.  CT showed right neck cellulitis, abscess, right IJ thrombosis, foreign bodies consistent with needle fragments. --11/13 admission for right neck cellulitis, abscess, right IJ thrombosis, transferred to [facility] for ENT and vascular surgery evaluation. --11/14 continue antibiotics, airway appears patent, no emergent condition apparent, await ENT, vascular consultations.  Also will involve ID.    Assessment and Plan * Neck abscess -- extensive cellulitis/phlegmon and abscess in the right lower neck at the level of the thyroid, surrounding spread of infection with retropharyngeal and hypopharyngeal edema and overlying sternocleidomastoid myositis, with associated right internal jugular vein thrombophlebitis with -- Continue empiric antibiotics, await ENT evaluation today, will also involve infectious disease for assistance with antibiotic regimen -- For now we will continue Suboxone, Dilaudid for as needed breakthrough pain.  Oral analgesics will not be able to combine it with the Suboxone.  If patient has surgery, may need to just use standard analgesia and put Suboxone on hold.  Thrombophlebitis of internal jugular vein -- CT showed right internal jugular vein thrombophlebitis with extensive surrounding cellulitis/phlegmon -- Anticoagulation per vascular surgery, further recommendations and management per vascular surgery  Foreign body of neck --Multiple chronic linear foreign bodies in the right lower neck, probably retained needles from IV drug use.  Management per ENT.  Opioid use disorder, moderate, in  sustained remission (HCC) -- Reports no IV drug use for 2 years -- Continue Suboxone  Seizure disorder ([place]) -- Appears stable.  Continue Keppra.  Followed by Dr. Delice Lesch  Back pain -- 6 begins unclear.  Monitor.  If persists may need imaging.  HIV screen in process  Subjective:  Complains of right-sided neck pain, difficulty turning his neck to the right.  Painful to swallow but swallowing okay.  Breathing okay.  Objective Vitals:   10/11/21 2320 10/12/21 0311 10/12/21 0742 10/12/21 1131  BP: 127/79 120/87 115/70 103/66  Pulse: 85 80 87 73  Resp: 16 19 18 12   Temp: 97.7 F (36.5 C) 97.7 F (36.5 C) 97.6 F (36.4 C) (!) 97.4 F (36.3 C)  TempSrc: Oral Oral Oral Oral  SpO2: 100% 97% 98% 99%  Weight:      Height:       77.1 kg  Vital signs were reviewed and unremarkable.  Exam Physical Exam Constitutional:      General: He is not in acute distress. HENT:     Mouth/Throat:     Comments: Oropharynx appears clear and widely patent Cardiovascular:     Rate and Rhythm: Normal rate and regular rhythm.     Heart sounds: No murmur heard. Pulmonary:     Effort: Pulmonary effort is normal. No respiratory distress.     Breath sounds: No wheezing, rhonchi or rales.  Skin:    Comments: Right-sided neck induration, visible veins noted  Neurological:     Mental Status: He is alert.  Psychiatric:        Mood and Affect: Mood normal.        Behavior: Behavior normal.    Labs / Other Information My review of labs, imaging, notes and other tests is significant for unremarkable CBC  Blood cultures pending  Disposition Plan: Status is: Inpatient  Remains inpatient appropriate because: Further evaluation of neck abscess, IJ thrombophlebitis, ENT, vascular surgery and ID evaluations.  Heparin No family present  Time spent: 35 minutes [facility] 10/12/2021, 11:34 AM

## 2021-10-12 NOTE — Assessment & Plan Note (Addendum)
--   Reports no IV drug use for 2 years -- Would like to continue Suboxone at this point but patient refuses.

## 2021-10-12 NOTE — Hospital Course (Addendum)
30 year old man PMH IV drug use (remission 2 years by report), opioid use disorder on Suboxone, Stevens-Johnson syndrome with penicillin, left IJ thrombosis and neck abscess August 2020, off apixaban 1 to 2 years, presented with right-sided neck pain.  CT showed right neck cellulitis, abscess, right IJ thrombosis, foreign bodies consistent with needle fragments. --11/13 admission for right neck cellulitis, abscess, right IJ thrombosis, transferred to [facility] for ENT and vascular surgery evaluation. --11/14 continue antibiotics, airway appears patent, no emergent condition apparent, ENT recommended conservative management, ID following, vascular surgery recommended anticoagulation --11/15 minimal if any improvement, continue antibiotics today, likely reimage neck tomorrow if not significantly improved, consider PICC tomorrow  Consultants Vascular surgery ENT Infectious disease

## 2021-10-12 NOTE — Assessment & Plan Note (Signed)
--   Appears stable.  Continue Keppra.  Followed by Dr. Delice Lesch

## 2021-10-12 NOTE — Consult Note (Signed)
[facility]                                                                                        Patient Identification: Patient Name: [name] MRN: [id number] [place] Date: 10/11/2021 12:27 PM Today's Date: 10/12/2021 Reason for consult: Neck abscess Requesting provider: Murray Hodgkins   Principal Problem:   Neck abscess Active Problems:   Thrombophlebitis of internal jugular vein   Seizure disorder ([facility])   Foreign body of neck   Opioid use disorder, moderate, in sustained remission (HCC)   Back pain   Antibiotics: Vancomycin 11/13                    Levofloxacin 11/13                    Metronidazole 11/13  Lines/Hardware:  Assessment # RT IJ thrombophlebitis with Rt lower neck abscess /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  # IVDU  # Hepatitis C # Opioid use d/o on suboxone # Tobacco use   Recommendations  Cotinue Vancomycin, pharmacy to dose, levofloxacin and metronidazole as is 2.   Fu blood cultures  3.   TTE ( ordered) 4.   Check HCV RNA and HIV ab  5.   Fu ENT and Vascular surgery recommendations in terms of surgical intervention for source control. Please send sample for aerobic/anaerobic and fungal cx.for targeted therapy. His blood cultures have been no growth so far. 6.   Monitor CBC, BMP, added on ESR and CRP  7.   Following  Rest of the management as per the primary team. Please call with questions or concerns.  Thank you for the consult  Rosiland Oz, MD Infectious Disease Physician [facility] [address] [place], [place] [address] Phone: [phone]  Fax: [phone]  __________________________________________________________________________________________________________ HPI and Hospital Course: 30 Y O male with PMH of Polysubstance abuse,  Opiate addiction on suboxone with h/o IJ thrombosis  and neck abscess in 06/2019 seizures, SJS, hepatitis C who presented to the ED with complains of rt sided neck pain and swelling for 3-4 days. Last injection in the neck region was at least 6 months ago. Associated symptoms include swelling of face, SOB, dysphagia, fevers, vomiting. He is a poor historian and drowsy with difficulty being persistently awake. He tells only medications he is taking at home is Keppra. He says it has been a while since he injected IVDU. Smokes 1 pack of cigarettes a day. Complains of chronic headache and neck pain. Denies nausea, vomiting, abdominal pain but had diarrhea recently( resolved now)   Of note, patient was admitted in 06/2019 for left neck abscess and Left IJ thrombosis, was anticoagulated and was supposed to be on chronic eliquis. He was also seen by ID and completed 2.5  weeks of IV and PO abtx following I and D of abscess. ( Cx grew MRSA)  At ED, afebrile, no leukocytosis, LA WNL CBC and CMP unremarkable  CT soft tissue neck and chest as below ENT and Vascular surgery has been consulted   ROS: General- Denies chills, loss of appetite and loss of weight, Fevers + HEENT - Denies blurry vision,  sinus pain, Chronic head and neck pain + Chest - Denies any chest pain, or cough SOB+ CVS- Denies any dizziness/lightheadedness, syncopal attacks, palpitations Abdomen- Denies any nausea, vomiting, abdominal pain, hematochezia and diarrhea Neuro - Denies any weakness, numbness, tingling sensation Psych - Denies any changes in mood irritability or depressive symptoms GU- Denies any burning, dysuria, hematuria or increased frequency of urination Skin - denies any  rashes/lesions MSK - Rt sided neck/face swelling and pain, limited ROM of neck   Past Medical History:  Diagnosis Date   Hepatitis C    History of Stevens-Johnson toxic epidermal necrolysis overlap syndrome    with PCN, not with keflex   Opiate addiction (HCC)    on methadone   Seizure [facility])    Past Surgical History:  Procedure Laterality Date   INCISION AND DRAINAGE ABSCESS Left 07/25/2019   Procedure: INCISION AND DRAINAGE NECK ABSCESS;  Surgeon: Jerrell Belfast, MD;  Location: [facility];  Service: ENT;  Laterality: Left;   TYMPANOSTOMY TUBE PLACEMENT     59-66 years old     Scheduled Meds:  buprenorphine-naloxone  1 tablet Sublingual BID   dexamethasone (DECADRON) injection  8 mg Intravenous Q8H   dexamethasone (DECADRON) injection  8 mg Intravenous Q8H   levETIRAcetam  750 mg Oral BID   topiramate  100 mg Oral BID   Continuous Infusions:  heparin 1,400 Units/hr (10/12/21 0544)   levofloxacin (LEVAQUIN) IV     metronidazole Stopped (10/12/21 0425)   vancomycin     PRN Meds:.acetaminophen **OR** acetaminophen, HYDROmorphone (DILAUDID) injection, ondansetron **OR** ondansetron (ZOFRAN) IV  Allergies  Allergen Reactions   Amoxicillin Other (See Comments)    Steven-Johnson's Syndrome   Ceclor [Cefaclor] Other (See Comments)    Steven-Johnson's Syndrome   Erythromycin Other (See Comments)    Steven-Johnson's Syndrome   Penicillins Other (See Comments)    Steven-Johnson's Syndrome   Septra [Sulfamethoxazole-Trimethoprim] Other (See Comments)    Steven-Johnson's Syndrome   Tylenol [Acetaminophen] Other (See Comments)    Unknown reaction - doctor told pt not to take   Social History   Socioeconomic History   Marital status: Married    Spouse name: Not on file   Number of children: 1   Years of education: Not on file   Highest education level: Not on file  Occupational History   Not on file  Tobacco Use   Smoking status: Every Day    Packs/day: 0.00    Years: 5.00     Pack years: 0.00    Types: Cigarettes   Smokeless tobacco: Never  Vaping Use   Vaping Use: Never used  Substance and Sexual Activity   Alcohol use: No   Drug use: Not Currently    Types: Marijuana    Comment: Opiod addiction   Sexual activity: Yes    Partners: Female  Other Topics Concern   Not on file  Social History Narrative   Lives in [place].    Trying to find work.       Right handed      12th grade      1 son has a daughter  Lives with grandmother and his son lives with them      Social Determinants of Health   Financial Resource Strain: Not on file  Food Insecurity: Not on file  Transportation Needs: Not on file  Physical Activity: Not on file  Stress: Not on file  Social Connections: Not on file  Intimate Partner Violence: Not on file   Family History  Problem Relation Age of Onset   Diabetes Other        grandparents   Stroke Other    Pancreatic cancer Other    Diabetes Mellitus II Other      Vitals BP 103/66 (BP Location: Left Arm)   Pulse 73   Temp (!) 97.4 F (36.3 C) (Oral)   Resp 12   Ht 5' 6"  (1.676 m)   Wt 77.1 kg   SpO2 99%   BMI 27.44 kg/m    Physical Exam Constitutional:  lying in bed, not in acute distress, drowsy     Comments:   Cardiovascular:     Rate and Rhythm: Normal rate and regular rhythm.     Heart sounds:   Pulmonary:     Effort: Pulmonary effort is normal.     Comments: clear lung fields bilaterally   Abdominal:     Palpations: Abdomen is soft.     Tenderness: Non tender and non distended   Musculoskeletal:        General: Rt sided neck/facial  swelling, Limited ROM of neck   Skin:    Comments: tattoos+  Neurological:     General: No focal deficit present.   Psychiatric:        Mood and Affect. Drowsy    Pertinent Microbiology Results for orders placed or performed during the hospital encounter of 10/11/21  Blood culture (routine x 2)     Status: None (Preliminary result)    Collection Time: 10/11/21  2:15 PM   Specimen: BLOOD  Result Value Ref Range Status   Specimen Description   Final    BLOOD RIGHT ANTECUBITAL Performed at [facility] [address], [place], [place] [address]    Special Requests   Final    BOTTLES DRAWN AEROBIC AND ANAEROBIC Blood Culture adequate volume Performed at [facility] [address] [place], [place] [address]    Culture   Final    NO GROWTH < 12 HOURS Performed at [facility] [address], [place], [place] [address]    Report Status PENDING  Incomplete  Resp Panel by RT-PCR (Flu A&B, Covid)     Status: None   Collection Time: 10/11/21  2:26 PM   Specimen: Nasopharyngeal(NP) swabs in vial transport medium  Result Value Ref Range Status   SARS Coronavirus 2 by RT PCR NEGATIVE NEGATIVE Final    Comment: (NOTE) SARS-CoV-2 target nucleic acids are NOT DETECTED.  The SARS-CoV-2 RNA is generally detectable in upper respiratory specimens during the acute phase of infection. The lowest concentration of SARS-CoV-2 viral copies this assay can detect is 138 copies/mL. A negative result does not preclude SARS-Cov-2 infection and should not be used as the sole basis for treatment or other patient management decisions. A negative result may occur with  improper specimen collection/handling, submission of specimen other than nasopharyngeal swab, presence of viral mutation(s) within the areas targeted by this assay, and inadequate number of viral copies(<138 copies/mL). A negative result must be combined with clinical observations, patient history, and epidemiological information. The expected result is  Negative.  Fact Sheet for Patients:  EntrepreneurPulse.com.au  Fact Sheet for Healthcare Providers:  IncredibleEmployment.be  This test is no t yet approved or cleared by the [place] FDA and  has been authorized for detection and/or  diagnosis of SARS-CoV-2 by FDA under an Emergency Use Authorization (EUA). This EUA will remain  in effect (meaning this test can be used) for the duration of the COVID-19 declaration under Section 564(b)(1) of the Act, 21 U.S.C.section 360bbb-3(b)(1), unless the authorization is terminated  or revoked sooner.       Influenza A by PCR NEGATIVE NEGATIVE Final   Influenza B by PCR NEGATIVE NEGATIVE Final    Comment: (NOTE) The Xpert Xpress SARS-CoV-2/FLU/RSV plus assay is intended as an aid in the diagnosis of influenza from Nasopharyngeal swab specimens and should not be used as a sole basis for treatment. Nasal washings and aspirates are unacceptable for Xpert Xpress SARS-CoV-2/FLU/RSV testing.  Fact Sheet for Patients: EntrepreneurPulse.com.au  Fact Sheet for Healthcare Providers: IncredibleEmployment.be  This test is not yet approved or cleared by the [place] FDA and has been authorized for detection and/or diagnosis of SARS-CoV-2 by FDA under an Emergency Use Authorization (EUA). This EUA will remain in effect (meaning this test can be used) for the duration of the COVID-19 declaration under Section 564(b)(1) of the Act, 21 U.S.C. section 360bbb-3(b)(1), unless the authorization is terminated or revoked.  Performed at [facility], [place] [address], [place], [place] [address]      Pertinent Lab seen by me: CBC Latest Ref Rng & Units 10/12/2021 10/11/2021 07/27/2019  WBC 4.0 - 10.5 K/uL 7.3 9.5 5.1  Hemoglobin 13.0 - 17.0 g/dL 11.9(L) 12.3(L) 10.7(L)  Hematocrit 39.0 - 52.0 % 35.5(L) 37.4(L) 33.1(L)  Platelets 150 - 400 K/uL 319 309 514(H)   CMP Latest Ref Rng & Units 10/11/2021 07/28/2019 07/27/2019  Glucose 70 - 99 mg/dL 103(H) 105(H) 105(H)  BUN 6 - 20 mg/dL 10 7 5(L)  Creatinine 0.61 - 1.24 mg/dL 0.62 0.70 0.67  Sodium 135 - 145 mmol/L 137 140 142  Potassium 3.5 - 5.1 mmol/L 3.9 3.5 3.8  Chloride 98 - 111  mmol/L 97(L) 103 106  CO2 22 - 32 mmol/L 30 27 27   Calcium 8.9 - 10.3 mg/dL 9.2 9.0 8.8(L)  Total Protein 6.5 - 8.1 g/dL 7.8 - -  Total Bilirubin 0.3 - 1.2 mg/dL 0.4 - -  Alkaline Phos 38 - 126 U/L 68 - -  AST 15 - 41 U/L 12(L) - -  ALT 0 - 44 U/L 9 - -     Pertinent Imagings/Other Imagings Plain films and CT images have been personally visualized and interpreted; radiology reports have been reviewed. Decision making incorporated into the Impression / Recommendations.  CT Soft Tissue Neck W Contrast  Result Date: 10/11/2021 CLINICAL DATA:  Neck mass, initial workup EXAM: CT NECK WITH CONTRAST TECHNIQUE: Multidetector CT imaging of the neck was performed using the standard protocol following the bolus administration of intravenous contrast. CONTRAST:  42m OMNIPAQUE IOHEXOL 350 MG/ML SOLN COMPARISON:  CT neck July 22, 2019 FINDINGS: Pharynx and larynx: Retropharyngeal and hypopharyngeal edema. Retropharyngeal edema extends from the craniocervical junction inferiorly to approximately the C5-C6 level, measuring up to 6 mm in thickness. There is some mass effect on the supraglottic airway, which remains patent. Salivary glands: No inflammation, mass, or stone. Thyroid: Normal. Lymph nodes: Mildly prominent right supraclavicular and cervical chain nodes, probably reactive given low findings. Vascular/soft tissues: Thrombosis of the right internal  jugular vein from the C5 level inferiorly. At the level of the thyroid gland there is an adjacent abscess that is likely contiguous with thrombosed internal jugular vein; however, the vein is not well visualized in this region. This abscess is irregular and measures approximately 6 2.3 x 1.8 x 2.6 cm (series 5, image 92; series 9, image 79). There is extensive surrounding edema/soft tissue thickening with asymmetric enlargement of the adjacent sternocleidomastoid, compatible with cellulitis/phlegmon and myositis. Also, in this vicinity there are 3 linear  foreign bodies, probably needles from IV drug use (annotated on series 5, images 83, 95, and 98). These needles are chronic and present on prior CT neck 2020. The left internal jugular vein is not well visualized. Limited intracranial: Negative. Visualized orbits: Negative. Mastoids and visualized paranasal sinuses: Mild paranasal sinus mucosal thickening. No mastoid effusions. Skeleton: No evidence of acute abnormality. Upper chest: Evaluated on concurrent CT chest. IMPRESSION: 1. Findings compatible with right internal jugular vein thrombophlebitis with extensive surrounding cellulitis/phlegmon and abscess in the right lower neck at the level of the thyroid, as detailed above. Surrounding spread of infection with retropharyngeal and hypopharyngeal edema and overlying sternocleidomastoid myositis. 2. Multiple chronic linear foreign bodies in the right lower neck, probably retained needles from IV drug use. 3. The left internal jugular vein is not well visualized, potentially chronically thrombosed or postsurgical given findings on prior CT neck. Electronically Signed   By: Margaretha Sheffield M.D.   On: 10/11/2021 16:24   CT Angio Chest PE W and/or Wo Contrast  Result Date: 10/11/2021 CLINICAL DATA:  IV drug use; PE suspected, high prob History of IJ thrombus, concern for recurrence but also concern for pulmonary embolism or SVC syndrome. EXAM: CT ANGIOGRAPHY CHEST WITH CONTRAST TECHNIQUE: Multidetector CT imaging of the chest was performed using the standard protocol during bolus administration of intravenous contrast. Multiplanar CT image reconstructions and MIPs were obtained to evaluate the vascular anatomy. CONTRAST:  63m OMNIPAQUE IOHEXOL 350 MG/ML SOLN COMPARISON:  July 24, 2019. FINDINGS: Cardiovascular: Satisfactory opacification of the pulmonary arteries to the segmental level. No evidence of pulmonary embolism. Normal heart size. No pericardial effusion. Mediastinum/Nodes: There are several linear  metallic densities overlapping the RIGHT neck likely reflecting retained needle fragments from IV drug use (series 6, image 26; series 6, image 30; series 6, image 39).; Please see separately dictated report regarding neck findings. Soft tissue the anterior mediastinum likely reflecting residual thymus. No axillary adenopathy. Mildly prominent RIGHT paratracheal lymph node measures 8 mm in the short axis and is likely reactive in etiology (series 4, image 54). Lungs/Pleura: Bibasilar atelectasis. No pleural effusion or pneumothorax. Upper Abdomen: No acute abnormality. Musculoskeletal: No acute osseous abnormality. Review of the MIP images confirms the above findings. IMPRESSION: 1.  No acute pulmonary embolism 2. There are several linear metallic densities overlapping the RIGHT neck likely reflecting retained needle fragments from IV drug use; Please see separately dictated report regarding neck findings. Electronically Signed   By: SValentino SaxonM.D.   On: 10/11/2021 16:11     I spent more than 80  minutes for this patient encounter including review of prior medical records/discussing diagnostics and treatment plan with the patient/family/coordinate care with primary/other specialits with greater than 50% of time in face to face encounter.   Electronically signed by:   SRosiland Oz MD Infectious Disease Physician [facility] Pager: [phone]

## 2021-10-13 DIAGNOSIS — I808 Phlebitis and thrombophlebitis of other sites: Secondary | ICD-10-CM | POA: Diagnosis not present

## 2021-10-13 DIAGNOSIS — F1121 Opioid dependence, in remission: Secondary | ICD-10-CM | POA: Diagnosis not present

## 2021-10-13 DIAGNOSIS — I330 Acute and subacute infective endocarditis: Secondary | ICD-10-CM

## 2021-10-13 DIAGNOSIS — L0211 Cutaneous abscess of neck: Principal | ICD-10-CM

## 2021-10-13 LAB — CBC
HCT: 35.8 % — ABNORMAL LOW (ref 39.0–52.0)
Hemoglobin: 12.2 g/dL — ABNORMAL LOW (ref 13.0–17.0)
MCH: 28.6 pg (ref 26.0–34.0)
MCHC: 34.1 g/dL (ref 30.0–36.0)
MCV: 83.8 fL (ref 80.0–100.0)
Platelets: 333 10*3/uL (ref 150–400)
RBC: 4.27 MIL/uL (ref 4.22–5.81)
RDW: 12.1 % (ref 11.5–15.5)
WBC: 15.9 10*3/uL — ABNORMAL HIGH (ref 4.0–10.5)
nRBC: 0.2 % (ref 0.0–0.2)

## 2021-10-13 LAB — HEPATITIS B SURFACE ANTIGEN: Hepatitis B Surface Ag: NONREACTIVE

## 2021-10-13 LAB — BASIC METABOLIC PANEL
Anion gap: 9 (ref 5–15)
BUN: 18 mg/dL (ref 6–20)
CO2: 19 mmol/L — ABNORMAL LOW (ref 22–32)
Calcium: 9 mg/dL (ref 8.9–10.3)
Chloride: 107 mmol/L (ref 98–111)
Creatinine, Ser: 0.86 mg/dL (ref 0.61–1.24)
GFR, Estimated: >60 mL/min (ref >60)
Glucose, Bld: 208 mg/dL — ABNORMAL HIGH (ref 70–99)
Potassium: 3.8 mmol/L (ref 3.5–5.1)
Sodium: 135 mmol/L (ref 135–145)

## 2021-10-13 LAB — HEPATITIS B CORE ANTIBODY, TOTAL: Hep B Core Total Ab: NONREACTIVE

## 2021-10-13 LAB — HEPARIN LEVEL (UNFRACTIONATED): Heparin Unfractionated: 0.33 IU/mL (ref 0.30–0.70)

## 2021-10-13 LAB — HIV ANTIBODY (ROUTINE TESTING W REFLEX): HIV Screen 4th Generation wRfx: NONREACTIVE

## 2021-10-13 MED ORDER — HYDROMORPHONE HCL 1 MG/ML IJ SOLN
1.5000 mg | INTRAMUSCULAR | Status: DC | PRN
  Administered 2021-10-13: 1.5 mg via INTRAVENOUS
  Filled 2021-10-13: qty 2

## 2021-10-13 MED ORDER — ENOXAPARIN SODIUM 120 MG/0.8ML IJ SOSY
120.0000 mg | PREFILLED_SYRINGE | INTRAMUSCULAR | Status: DC
  Administered 2021-10-13 – 2021-10-18 (×6): 120 mg via SUBCUTANEOUS
  Filled 2021-10-13 (×7): qty 0.8

## 2021-10-13 MED ORDER — HYDROMORPHONE HCL 1 MG/ML IJ SOLN
1.5000 mg | INTRAMUSCULAR | Status: DC | PRN
  Administered 2021-10-13 – 2021-10-14 (×5): 1.5 mg via INTRAVENOUS
  Filled 2021-10-13 (×5): qty 2

## 2021-10-13 MED ORDER — METRONIDAZOLE 500 MG PO TABS
500.0000 mg | ORAL_TABLET | Freq: Two times a day (BID) | ORAL | Status: DC
  Administered 2021-10-13 – 2021-10-19 (×12): 500 mg via ORAL
  Filled 2021-10-13 (×13): qty 1

## 2021-10-13 MED ORDER — OXYCODONE HCL 5 MG PO TABS
10.0000 mg | ORAL_TABLET | Freq: Four times a day (QID) | ORAL | Status: DC | PRN
  Administered 2021-10-13 – 2021-10-19 (×16): 10 mg via ORAL
  Filled 2021-10-13 (×17): qty 2

## 2021-10-13 MED ORDER — LEVOFLOXACIN 750 MG PO TABS
750.0000 mg | ORAL_TABLET | Freq: Every day | ORAL | Status: DC
  Administered 2021-10-13 – 2021-10-18 (×5): 750 mg via ORAL
  Filled 2021-10-13 (×6): qty 1

## 2021-10-13 MED ORDER — SENNA 8.6 MG PO TABS
1.0000 | ORAL_TABLET | Freq: Every day | ORAL | Status: DC
  Administered 2021-10-13 – 2021-10-14 (×2): 8.6 mg via ORAL
  Filled 2021-10-13 (×2): qty 1

## 2021-10-13 MED ORDER — POLYETHYLENE GLYCOL 3350 17 G PO PACK
17.0000 g | PACK | Freq: Two times a day (BID) | ORAL | Status: DC
  Administered 2021-10-13 – 2021-10-14 (×3): 17 g via ORAL
  Filled 2021-10-13 (×8): qty 1

## 2021-10-13 NOTE — Assessment & Plan Note (Signed)
--   Seen on transthoracic echocardiogram.  Plan for TEE, hopefully 11/16.

## 2021-10-13 NOTE — Progress Notes (Signed)
[facility] Infectious Diseases Follow Up Note  Patient Identification: Patient Name: [name] MRN: [id number] [place] Date: 10/11/2021 12:27 PM Age: 30 y.o.Today's Date: 10/13/2021   Reason for Visit:Neck abscess/possible endocarditis  Principal Problem:   Neck abscess Active Problems:   Thrombophlebitis of internal jugular vein   Seizure disorder ([place])   Foreign body of neck   Opioid use disorder, moderate, in sustained remission (HCC)   Back pain  Antibiotics: Vancomycin 11/13                    Levofloxacin 11/13                    Metronidazole 11/13   Lines/Hardware:    Interval Events: remains afebrile, WBC went up to 15.9, On Heparin, No plans for surgical intervention noted    Assessment Right IJ thrombophlebitis with right lower neck abscess/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 antibiotics).  Stopped taking anticoagulant approximately a year ago IVDU Hepatitis C Opioid use disorder on Suboxone Tobacco use   Recommendations Continue vancomycin, pharmacy to dose, levofloxacin and metronidazole. This is most likely MRSA related  as he had left sided neck abscess with MRSA in August 2020 TEE for clarification of possible vegetation noted in TTE If no definite plans for surgical intervention, would recommend IR guided aspiration of neck abscess for microbiologic diagnosis and targeted therapy Follow-up blood cultures Follow-up HCV RNA, HIV and Hep B serology Monitor CBC, BMP and Vancomycin trough  Anticoagulation per Primary and Vascular Discussed with Dr Estill Bamberg of the management as per the primary team. Thank you for the consult. Please page with  pertinent questions or concerns.  ______________________________________________________________________ Subjective patient seen and examined at the bedside. Pain and swelling in the left side of neck is better than yesterday, Denies any difficulty breathing of swallowing.   Vitals BP 118/69 (BP Location: Left Arm)   Pulse 80   Temp 97.7 F (36.5 C) (Oral)   Resp 18   Ht 5\' 6"  (1.676 m)   Wt 77.1 kg   SpO2 98%   BMI 27.44 kg/m     Physical Exam Constitutional:  sleeping in bed comfortably. Oral exam with no findings concerning for pharyngitis/tonsillitis    Comments: Left sided neck with minimal induration, swelling is improving   Cardiovascular:     Rate and Rhythm: Normal rate and regular rhythm.     Heart sounds:   Pulmonary:     Effort: Pulmonary effort is normal.     Comments:   Abdominal:     Palpations: Abdomen is soft.     Tenderness:   Musculoskeletal:        General: No swelling or tenderness.   Skin:    Comments: Tattoos+  Neurological:     General: No focal deficit present.   Psychiatric:        Mood and Affect: Mood normal.   Pertinent Microbiology Results for orders placed or performed during the hospital encounter of 10/11/21  Blood culture (routine x 2)     Status: None (Preliminary result)   Collection Time: 10/11/21  2:15 PM   Specimen: BLOOD  Result Value Ref Range Status   Specimen Description   Final    BLOOD RIGHT ANTECUBITAL Performed at [facility],  [place] [address], [place], [place] [address]    Special Requests   Final    BOTTLES DRAWN AEROBIC AND ANAEROBIC Blood Culture adequate volume Performed at [facility] [address] [place], [place] [address]    Culture   Final    NO GROWTH < 12 HOURS Performed at [facility] [address], [place], [place] [address]    Report Status PENDING  Incomplete  Resp Panel by RT-PCR (Flu A&B, Covid)     Status: None   Collection Time:  10/11/21  2:26 PM   Specimen: Nasopharyngeal(NP) swabs in vial transport medium  Result Value Ref Range Status   SARS Coronavirus 2 by RT PCR NEGATIVE NEGATIVE Final    Comment: (NOTE) SARS-CoV-2 target nucleic acids are NOT DETECTED.  The SARS-CoV-2 RNA is generally detectable in upper respiratory specimens during the acute phase of infection. The lowest concentration of SARS-CoV-2 viral copies this assay can detect is 138 copies/mL. A negative result does not preclude SARS-Cov-2 infection and should not be used as the sole basis for treatment or other patient management decisions. A negative result may occur with  improper specimen collection/handling, submission of specimen other than nasopharyngeal swab, presence of viral mutation(s) within the areas targeted by this assay, and inadequate number of viral copies(<138 copies/mL). A negative result must be combined with clinical observations, patient history, and epidemiological information. The expected result is Negative.  Fact Sheet for Patients:  EntrepreneurPulse.com.au  Fact Sheet for Healthcare Providers:  IncredibleEmployment.be  This test is no t yet approved or cleared by the [place] FDA and  has been authorized for detection and/or diagnosis of SARS-CoV-2 by FDA under an Emergency Use Authorization (EUA). This EUA will remain  in effect (meaning this test can be used) for the duration of the COVID-19 declaration under Section 564(b)(1) of the Act, 21 U.S.C.section 360bbb-3(b)(1), unless the authorization is terminated  or revoked sooner.       Influenza A by PCR NEGATIVE NEGATIVE Final   Influenza B by PCR NEGATIVE NEGATIVE Final    Comment: (NOTE) The Xpert Xpress SARS-CoV-2/FLU/RSV plus assay is intended as an aid in the diagnosis of influenza from Nasopharyngeal swab specimens and should not be used as a sole basis for treatment. Nasal washings and aspirates are  unacceptable for Xpert Xpress SARS-CoV-2/FLU/RSV testing.  Fact Sheet for Patients: EntrepreneurPulse.com.au  Fact Sheet for Healthcare Providers: IncredibleEmployment.be  This test is not yet approved or cleared by the [place] FDA and has been authorized for detection and/or diagnosis of SARS-CoV-2 by FDA under an Emergency Use Authorization (EUA). This EUA will remain in effect (meaning this test can be used) for the duration of the COVID-19 declaration under Section 564(b)(1) of the Act, 21 U.S.C. section 360bbb-3(b)(1), unless the authorization is terminated or revoked.  Performed at [facility], [place] [address] [place],  [address]   MRSA Next Gen by PCR, Nasal     Status: None   Collection Time: 10/12/21 12:07 PM   Specimen: Nasal Mucosa; Nasal Swab  Result Value Ref Range Status   MRSA by PCR Next Gen NOT DETECTED NOT DETECTED Final    Comment: (NOTE) The GeneXpert MRSA Assay (FDA approved for NASAL specimens only), is one component of a comprehensive MRSA colonization surveillance program. It is not intended to diagnose MRSA infection nor to guide or monitor treatment for MRSA infections. Test performance is not FDA approved in patients less than 29 years old. Performed  at [facility], [place] [address] [place], [place] [address]     Pertinent Lab. CBC Latest Ref Rng & Units 10/13/2021 10/12/2021 10/11/2021  WBC 4.0 - 10.5 K/uL 15.9(H) 7.3 9.5  Hemoglobin 13.0 - 17.0 g/dL 12.2(L) 11.9(L) 12.3(L)  Hematocrit 39.0 - 52.0 % 35.8(L) 35.5(L) 37.4(L)  Platelets 150 - 400 K/uL 333 319 309   CMP Latest Ref Rng & Units 10/13/2021 10/11/2021 07/28/2019  Glucose 70 - 99 mg/dL 208(H) 103(H) 105(H)  BUN 6 - 20 mg/dL 18 10 7   Creatinine 0.61 - 1.24 mg/dL 0.86 0.62 0.70  Sodium 135 - 145 mmol/L 135 137 140  Potassium 3.5 - 5.1 mmol/L 3.8 3.9 3.5  Chloride 98 - 111 mmol/L 107 97(L) 103  CO2 22 - 32 mmol/L  19(L) 30 27  Calcium 8.9 - 10.3 mg/dL 9.0 9.2 9.0  Total Protein 6.5 - 8.1 g/dL - 7.8 -  Total Bilirubin 0.3 - 1.2 mg/dL - 0.4 -  Alkaline Phos 38 - 126 U/L - 68 -  AST 15 - 41 U/L - 12(L) -  ALT 0 - 44 U/L - 9 -     Pertinent Imaging today Plain films and CT images have been personally visualized and interpreted; radiology reports have been reviewed. Decision making incorporated into the Impression / Recommendations.  TTE 10/12/21 1. Left ventricular ejection fraction, by estimation, is 55 to 60%. The  left ventricle has normal function. The left ventricle has no regional  wall motion abnormalities. Left ventricular diastolic parameters are  indeterminate.   2. Right ventricular systolic function is normal. The right ventricular  size is normal. Tricuspid regurgitation signal is inadequate for assessing  PA pressure.   3. The mitral valve is normal in structure. No evidence of mitral valve  regurgitation. No evidence of mitral stenosis.   4. There is a poorly visualized highly mobile small mass (frame 35, 99)  on the anterior leaflet of the tricuspid valve. Recommend TEE for  clarification, given the current clinical setting a vegetation can not  ruled out.   5. The aortic valve has an indeterminant number of cusps. Aortic valve  regurgitation is not visualized. No aortic stenosis is present.   6. The inferior vena cava is normal in size with greater than 50%  respiratory variability, suggesting right atrial pressure of 3 mmHg.   Conclusion(s)/Recommendation(s): Findings concerning for vegetation on the  anterior leaflet of the tricuspid valve, would recommend Transesophageal  Echocardiogram for clarification.     I spent more than 35 minutes for this patient encounter including review of prior medical records, coordination of care  with greater than 50% of time being face to face/counseling and discussing diagnostics/treatment plan with the patient/family.  Electronically  signed by:   Rosiland Oz, MD Infectious Disease Physician [facility] Pager: [phone]

## 2021-10-13 NOTE — Progress Notes (Signed)
Progress Note [name]   SHF:[id number]  DOB: 11-08-1991  DOA: 10/11/2021     2 Date of Service: 10/13/2021   Clinical Course 30 year old man PMH IV drug use (remission 2 years by report), opioid use disorder on Suboxone, Stevens-Johnson syndrome with penicillin, left IJ thrombosis and neck abscess August 2020, off apixaban 1 to 2 years, presented with right-sided neck pain.  CT showed right neck cellulitis, abscess, right IJ thrombosis, foreign bodies consistent with needle fragments. --11/13 admission for right neck cellulitis, abscess, right IJ thrombosis, transferred to [facility] for ENT and vascular surgery evaluation. --11/14 continue antibiotics, airway appears patent, no emergent condition apparent, ENT recommended conservative management, ID following, vascular surgery recommended anticoagulation --11/15 minimal if any improvement, continue antibiotics today, likely reimage neck tomorrow if not significantly improved, consider PICC tomorrow  Consultants Vascular surgery ENT Infectious disease  Assessment and Plan * Neck abscess -- extensive cellulitis/phlegmon and abscess in the right lower neck at the level of the thyroid, surrounding spread of infection with retropharyngeal and hypopharyngeal edema and overlying sternocleidomastoid myositis, with associated right internal jugular vein thrombophlebitis with -- Per ENT conservative management with antibiotics, also followed by infectious disease --Modest improvement if any.  Plan for CT tomorrow if no clinical improvement and reconsultation with ENT.  Discussed with interventional radiology today, they request ENT reevaluation prior to proceeding with any aspiration.  Reevaluate tomorrow. --Patient refusing Suboxone, will continue Dilaudid for as needed breakthrough pain but add oral medication as well.   -- Consider PICC when blood cultures negative at least 72 hours.  Thrombophlebitis of internal jugular vein -- CT  showed right internal jugular vein thrombophlebitis with extensive surrounding cellulitis/phlegmon -- Anticoagulation per vascular surgery.  Transition to oral after any procedures complete.  Follow-up as an outpatient.  Tricuspid valve vegetation -- Seen on transthoracic echocardiogram.  Plan for TEE, hopefully 11/16.  Foreign body of neck --Multiple chronic linear foreign bodies in the right lower neck, probably retained needles from IV drug use.  Management per ENT.  Opioid use disorder, moderate, in sustained remission ([place]) -- Reports no IV drug use for 2 years -- Would like to continue Suboxone at this point but patient refuses.  Seizure disorder (HCC) -- Appears stable.  Continue Keppra.  Followed by Dr. Delice Lesch  Back pain -- Acute on chronic low back pain.  No neurologic complicating features suspected.  As may need CT neck tomorrow, will hold off today but would consider low back imaging in the near future unless pain resolves.  Given needles present, avoid MRI.  Subjective:  Feels of the same today, continues to have significant neck pain, severe.  Swallowing okay and breathing okay.  Refuses Suboxone at this time until after pain improved.  Objective Vitals:   10/13/21 0026 10/13/21 0416 10/13/21 0832 10/13/21 1248  BP: 123/83 118/69 108/69 92/61  Pulse: 85 80 82 80  Resp: 17 18 18 18   Temp: 97.7 F (36.5 C) 97.7 F (36.5 C) 97.8 F (36.6 C) 97.8 F (36.6 C)  TempSrc: Oral Oral Oral Oral  SpO2: 98% 98% 98% 98%  Weight:      Height:       77.1 kg  Vital signs were reviewed and unremarkable.  Exam Physical Exam Vitals reviewed.  Constitutional:      General: He is not in acute distress.    Appearance: He is not ill-appearing or toxic-appearing.  Neck:     Comments: Slightly decreased prominence right side neck swelling and venous  distention. Cardiovascular:     Rate and Rhythm: Normal rate and regular rhythm.     Heart sounds: No murmur heard. Pulmonary:      Effort: Pulmonary effort is normal. No respiratory distress.     Breath sounds: No wheezing or rales.  Psychiatric:        Mood and Affect: Mood normal.        Behavior: Behavior normal.    Labs / Other Information My review of labs, imaging, notes and other tests is significant for    BMP unremarkable.  WBC up to 15.9, patient on steroids. Received steroids. Blood cultures pending no growth to date  Disposition Plan: Status is: Inpatient  Remains inpatient appropriate because: Treatment for neck abscess, concern for endocarditis  Enoxaparin No family present  Time spent: 35 minutes [facility] 10/13/2021, 3:05 PM

## 2021-10-13 NOTE — Progress Notes (Addendum)
[name] for IV heparin>>Lovenox Indication: IJ thrombus  Allergies  Allergen Reactions   Amoxicillin Other (See Comments)    Steven-Johnson's Syndrome   Ceclor [Cefaclor] Other (See Comments)    Steven-Johnson's Syndrome   Erythromycin Other (See Comments)    Steven-Johnson's Syndrome   Penicillins Other (See Comments)    Steven-Johnson's Syndrome   Septra [Sulfamethoxazole-Trimethoprim] Other (See Comments)    Steven-Johnson's Syndrome   Tylenol [Acetaminophen] Other (See Comments)    Unknown reaction - doctor told pt not to take    Patient Measurements: Height: 5\' 6"  (167.6 cm) Weight: 77.1 kg (170 lb) IBW/kg (Calculated) : 63.8 Heparin Dosing Weight: TBW  Vital Signs: Temp: 97.8 F (36.6 C) (11/15 0832) Temp Source: Oral (11/15 0832) BP: 108/69 (11/15 0832) Pulse Rate: 82 (11/15 0832)  Labs: Recent Labs    10/11/21 1330 10/11/21 2020 10/12/21 0757 10/13/21 0139 10/13/21 0440  HGB 12.3*  --  11.9*  --  12.2*  HCT 37.4*  --  35.5*  --  35.8*  PLT 309  --  319  --  333  HEPARINUNFRC  --   --  0.41 0.33  --   CREATININE 0.62  --   --  0.86  --   TROPONINIHS <2 <2  --   --   --      Estimated Creatinine Clearance: 121.6 mL/min (by C-G formula based on SCr of 0.86 mg/dL).   Medical History: Past Medical History:  Diagnosis Date   Hepatitis C    History of Stevens-Johnson toxic epidermal necrolysis overlap syndrome    with PCN, not with keflex   Opiate addiction ([place])    on methadone   Seizure ([place])     Medications:  Medications Prior to Admission  Medication Sig Dispense Refill Last Dose   Buprenorphine HCl-Naloxone HCl 8-2 MG FILM Place 1 Film under the tongue 2 (two) times daily.   10/10/2021 at pm   cyclobenzaprine (FLEXERIL) 5 MG tablet Take 1 tablet at night as needed for headache/neck pain (Patient taking differently: Take 5 mg by mouth at bedtime as needed (headache/neck pain).) 30 tablet 11 10/10/2021 at  pm   ibuprofen (ADVIL,MOTRIN) 200 MG tablet Take 1,200 mg by mouth 3 (three) times daily as needed for headache.   few weeks ago   levETIRAcetam (KEPPRA) 500 MG tablet Take 1 and 1/2 tablets twice a day (Patient taking differently: Take 750 mg by mouth 2 (two) times daily.) 270 tablet 3 10/11/2021 at am   topiramate (TOPAMAX) 100 MG tablet Take 1 tablet (100 mg total) by mouth 2 (two) times daily. 180 tablet 3 10/10/2021 at pm   Scheduled:   buprenorphine-naloxone  1 tablet Sublingual BID   levETIRAcetam  750 mg Oral BID   topiramate  100 mg Oral BID   Infusions:   heparin 1,400 Units/hr (10/13/21 0609)   levofloxacin (LEVAQUIN) IV 750 mg (10/12/21 1720)   metronidazole 500 mg (10/13/21 0440)   vancomycin 1,500 mg (10/12/21 1959)    Assessment: 20 yoM with PMH IVDU including injection into neck, admitted with septic thrombophlebitis of L jugular vein. Previous episode of same in Aug 2020, with patient noted to require high doses of heparin. ATIII level checked during that admission and found to be borderline low (75%).  Baseline INR, aPTT: not done; no PTA antithrombotics Prior anticoagulation: none  Heparin level came back therapeutic today but on the lower end of goal along with his hgb and plt. We will increase  his heparin drip slightly. Plan for 3-6 months of anticoagulation.   Addendum  Pt with only 1 IV line now. We will transition him to SQ lovenox instead since conservative management per ENT.   Goal of Therapy: Anti-Xa 1-2 Monitor platelets by anticoagulation protocol: Yes  Plan:  Dc heparin Lovenox 120mg  SQ qday F/u with oral anticoagulation plan Change IV levofloxacin/metronidazole to [place], PharmD, BCIDP, AAHIVP, CPP Infectious Disease Pharmacist 10/13/2021 8:36 AM

## 2021-10-14 ENCOUNTER — Inpatient Hospital Stay (HOSPITAL_COMMUNITY): Payer: Medicaid Other

## 2021-10-14 DIAGNOSIS — M545 Low back pain, unspecified: Secondary | ICD-10-CM | POA: Diagnosis not present

## 2021-10-14 DIAGNOSIS — G8929 Other chronic pain: Secondary | ICD-10-CM

## 2021-10-14 DIAGNOSIS — L0211 Cutaneous abscess of neck: Secondary | ICD-10-CM | POA: Diagnosis not present

## 2021-10-14 DIAGNOSIS — I808 Phlebitis and thrombophlebitis of other sites: Secondary | ICD-10-CM | POA: Diagnosis not present

## 2021-10-14 DIAGNOSIS — F1121 Opioid dependence, in remission: Secondary | ICD-10-CM | POA: Diagnosis not present

## 2021-10-14 LAB — CBC
HCT: 36.4 % — ABNORMAL LOW (ref 39.0–52.0)
Hemoglobin: 12 g/dL — ABNORMAL LOW (ref 13.0–17.0)
MCH: 28.4 pg (ref 26.0–34.0)
MCHC: 33 g/dL (ref 30.0–36.0)
MCV: 86.3 fL (ref 80.0–100.0)
Platelets: 262 10*3/uL (ref 150–400)
RBC: 4.22 MIL/uL (ref 4.22–5.81)
RDW: 12.2 % (ref 11.5–15.5)
WBC: 10.1 10*3/uL (ref 4.0–10.5)
nRBC: 0 % (ref 0.0–0.2)

## 2021-10-14 LAB — HCV RNA QUANT: HCV Quantitative: NOT DETECTED IU/mL (ref >50)

## 2021-10-14 LAB — VANCOMYCIN, PEAK: Vancomycin Pk: 38 ug/mL (ref 30–40)

## 2021-10-14 LAB — VANCOMYCIN, TROUGH: Vancomycin Tr: 15 ug/mL (ref 15–20)

## 2021-10-14 LAB — HEPATITIS B SURFACE ANTIBODY, QUANTITATIVE: Hep B S AB Quant (Post): 3.6 m[IU]/mL — ABNORMAL LOW (ref >9.9)

## 2021-10-14 MED ORDER — HYDROMORPHONE HCL 1 MG/ML IJ SOLN
2.0000 mg | INTRAMUSCULAR | Status: DC | PRN
  Administered 2021-10-14 – 2021-10-19 (×18): 2 mg via INTRAVENOUS
  Filled 2021-10-14 (×19): qty 2

## 2021-10-14 MED ORDER — VANCOMYCIN HCL 1000 MG/200ML IV SOLN
1000.0000 mg | Freq: Two times a day (BID) | INTRAVENOUS | Status: DC
  Administered 2021-10-14 – 2021-10-18 (×8): 1000 mg via INTRAVENOUS
  Filled 2021-10-14 (×8): qty 200

## 2021-10-14 MED ORDER — ACETAMINOPHEN 325 MG PO TABS
650.0000 mg | ORAL_TABLET | Freq: Four times a day (QID) | ORAL | Status: DC
  Administered 2021-10-15 – 2021-10-19 (×17): 650 mg via ORAL
  Filled 2021-10-14 (×21): qty 2

## 2021-10-14 MED ORDER — IOHEXOL 300 MG/ML  SOLN
75.0000 mL | Freq: Once | INTRAMUSCULAR | Status: AC | PRN
  Administered 2021-10-14: 75 mL via INTRAVENOUS

## 2021-10-14 NOTE — Progress Notes (Signed)
Pharmacy Antibiotic Note  [name] is a 30 y.o. male admitted on 10/11/2021 with  neck abscess/cellulitis .  Pharmacy has been consulted for vanc dosing along with levaquin/flagyl.   Pt is on D4 vanc/levofloxacin/flagyl for neck abscess and possible endocarditis. Pending TEE to confirm. We will get vanc levels today. Will take vanc order out of computer for now to avoid giving dose before trough is drawn. Cultures are neg so far. ID is on board.   Plan: Vanc 1500mg  IV x 1 ordered in ER, start 1500mg  IV q12 thereafter - goal AUC 400-550   Height: 5\' 6"  (167.6 cm) Weight: 77.1 kg (170 lb) IBW/kg (Calculated) : 63.8  Temp (24hrs), Avg:97.7 F (36.5 C), Min:97.6 F (36.4 C), Max:98 F (36.7 C)  Recent Labs  Lab 10/11/21 1330 10/11/21 2020 10/12/21 0757 10/13/21 0139 10/13/21 0440 10/14/21 0134  WBC 9.5  --  7.3  --  15.9* 10.1  CREATININE 0.62  --   --  0.86  --   --   LATICACIDVEN 0.9 0.8  --   --   --   --      Estimated Creatinine Clearance: 121.6 mL/min (by C-G formula based on SCr of 0.86 mg/dL).    Allergies  Allergen Reactions   Amoxicillin Other (See Comments)    Steven-Johnson's Syndrome   Ceclor [Cefaclor] Other (See Comments)    Steven-Johnson's Syndrome   Erythromycin Other (See Comments)    Steven-Johnson's Syndrome   Penicillins Other (See Comments)    Steven-Johnson's Syndrome   Septra [Sulfamethoxazole-Trimethoprim] Other (See Comments)    Steven-Johnson's Syndrome   Tylenol [Acetaminophen] Other (See Comments)    Unknown reaction - doctor told pt not to take    11/13 vanc>> 11/13 levaquin>> 11/13 metronidazole>>   11/13 blood>>ngtd Resolved hep C - repeating labs MRSA neg Hep B neg  Onnie Boer, PharmD, BCIDP, AAHIVP, CPP Infectious Disease Pharmacist 10/14/2021 9:40 AM

## 2021-10-14 NOTE — Progress Notes (Signed)
[facility] Infectious Diseases Follow Up Note  Patient Identification: Patient Name: [name] MRN: [id number] [place] Date: 10/11/2021 12:27 PM Age: 30 y.o.Today's Date: 10/14/2021   Reason for Visit:Neck abscess/possible endocarditis  Principal Problem:   Neck abscess Active Problems:   Thrombophlebitis of internal jugular vein   Seizure disorder ([place])   Foreign body of neck   Opioid use disorder, moderate, in sustained remission (HCC)   Back pain   Tricuspid valve vegetation  Antibiotics: Vancomycin 11/13                    Levofloxacin 11/13                    Metronidazole 11/13   Lines/Hardware:    Interval Events: remains afebrile, WBC went up to 10.1   Assessment Right IJ thrombophlebitis with right lower neck abscess/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 antibiotics).  Stopped taking anticoagulant approximately a year ago IVDU - HIV negative, heB serology with inconsistent immunity  Hepatitis C Opioid use disorder on Suboxone Tobacco use   Recommendations Continue vancomycin, pharmacy to dose, levofloxacin and metronidazole. This is most likely MRSA related  as he had left sided neck abscess with MRSA in August 2020 Tfu EE planned for tomorrow No significant improvement in pain and swelling in the left side neck. IR was consulted for possible aspiration of neck abscess- they recommend ENT re-eval prior to aspiration. ENT re-eval is pending . No positive cultures for microbiologic diagnosis so far. If no plans for surgical intervention by ENT, recommend IR guided aspiration for cultures  Follow-up blood cultures Follow-up HCV RNA Monitor CBC, BMP and  Vancomycin trough  Anticoagulation per Primary and Vascular Following   Rest of the management as per the primary team. Thank you for the consult. Please page with pertinent questions or concerns.  ______________________________________________________________________ Subjective patient seen and examined at the bedside. He just woke up, Left sided neck painis 10/10. Denies any SOB, dysphagia  Vitals BP 101/61 (BP Location: Left Arm)   Pulse 84   Temp 98 F (36.7 C) (Oral)   Resp 18   Ht 5\' 6"  (1.676 m)   Wt 77.1 kg   SpO2 98%   BMI 27.44 kg/m     Physical Exam Constitutional:  sleeping in bed comfortably. Oral exam with no findings concerning for pharyngitis/tonsillitis    Comments: Left sided neck with minimal swelling/tenderness, no fluctuance noted   Cardiovascular:     Rate and Rhythm: Normal rate and regular rhythm.     Heart sounds:   Pulmonary:     Effort: Pulmonary effort is normal.     Comments:   Abdominal:     Palpations: Abdomen is soft.     Tenderness:   Musculoskeletal:        General: No swelling or tenderness.   Skin:    Comments: Tattoos+  Neurological:     General: No focal deficit present.   Psychiatric:        Mood and Affect: Mood normal.   Pertinent Microbiology Results for orders placed or performed during the hospital encounter of 10/11/21  Blood culture (routine x 2)     Status: None (Preliminary result)   Collection Time: 10/11/21  2:15 PM   Specimen: BLOOD  Result Value Ref  Range Status   Specimen Description   Final    BLOOD RIGHT ANTECUBITAL Performed at [facility] [address] [place], [place] [address]    Special Requests   Final    BOTTLES DRAWN AEROBIC AND ANAEROBIC Blood Culture adequate volume Performed at [facility] [address] [place], [place] [address]    Culture   Final    NO GROWTH 3 DAYS Performed at [facility], [place] [address], [place], [place]  [address]    Report Status PENDING  Incomplete  Resp Panel by RT-PCR (Flu A&B, Covid)     Status: None   Collection Time: 10/11/21  2:26 PM   Specimen: Nasopharyngeal(NP) swabs in vial transport medium  Result Value Ref Range Status   SARS Coronavirus 2 by RT PCR NEGATIVE NEGATIVE Final    Comment: (NOTE) SARS-CoV-2 target nucleic acids are NOT DETECTED.  The SARS-CoV-2 RNA is generally detectable in upper respiratory specimens during the acute phase of infection. The lowest concentration of SARS-CoV-2 viral copies this assay can detect is 138 copies/mL. A negative result does not preclude SARS-Cov-2 infection and should not be used as the sole basis for treatment or other patient management decisions. A negative result may occur with  improper specimen collection/handling, submission of specimen other than nasopharyngeal swab, presence of viral mutation(s) within the areas targeted by this assay, and inadequate number of viral copies(<138 copies/mL). A negative result must be combined with clinical observations, patient history, and epidemiological information. The expected result is Negative.  Fact Sheet for Patients:  EntrepreneurPulse.com.au  Fact Sheet for Healthcare Providers:  IncredibleEmployment.be  This test is no t yet approved or cleared by the [place] FDA and  has been authorized for detection and/or diagnosis of SARS-CoV-2 by FDA under an Emergency Use Authorization (EUA). This EUA will remain  in effect (meaning this test can be used) for the duration of the COVID-19 declaration under Section 564(b)(1) of the Act, 21 U.S.C.section 360bbb-3(b)(1), unless the authorization is terminated  or revoked sooner.       Influenza A by PCR NEGATIVE NEGATIVE Final   Influenza B by PCR NEGATIVE NEGATIVE Final    Comment: (NOTE) The Xpert Xpress SARS-CoV-2/FLU/RSV plus assay is intended as an aid in the diagnosis of influenza from  Nasopharyngeal swab specimens and should not be used as a sole basis for treatment. Nasal washings and aspirates are unacceptable for Xpert Xpress SARS-CoV-2/FLU/RSV testing.  Fact Sheet for Patients: EntrepreneurPulse.com.au  Fact Sheet for Healthcare Providers: IncredibleEmployment.be  This test is not yet approved or cleared by the [place] FDA and has been authorized for detection and/or diagnosis of SARS-CoV-2 by FDA under an Emergency Use Authorization (EUA). This EUA will remain in effect (meaning this test can be used) for the duration of the COVID-19 declaration under Section 564(b)(1) of the Act, 21 U.S.C. section 360bbb-3(b)(1), unless the authorization is terminated or revoked.  Performed at [facility], [place] [address], [place], [place] [address]   Culture, blood (Routine X 2) w Reflex to ID Panel     Status: None (Preliminary result)   Collection Time: 10/11/21  8:20 PM   Specimen: BLOOD  Result Value Ref Range Status   Specimen Description   Final    BLOOD LEFT ANTECUBITAL Performed at [facility] [address], [place],  [address]    Special Requests   Final    BOTTLES DRAWN AEROBIC AND ANAEROBIC Blood Culture  adequate volume Performed at [facility] [address] [place], [place] [address]    Culture   Final    NO GROWTH 1 DAY Performed at [facility], [place] [address], [place], [place] [address]    Report Status PENDING  Incomplete  MRSA Next Gen by PCR, Nasal     Status: None   Collection Time: 10/12/21 12:07 PM   Specimen: Nasal Mucosa; Nasal Swab  Result Value Ref Range Status   MRSA by PCR Next Gen NOT DETECTED NOT DETECTED Final    Comment: (NOTE) The GeneXpert MRSA Assay (FDA approved for NASAL specimens only), is one component of a comprehensive MRSA colonization surveillance program. It is not intended to diagnose MRSA infection  nor to guide or monitor treatment for MRSA infections. Test performance is not FDA approved in patients less than 88 years old. Performed at [facility], [place] [address], [place], [place] [address]     Pertinent Lab. CBC Latest Ref Rng & Units 10/14/2021 10/13/2021 10/12/2021  WBC 4.0 - 10.5 K/uL 10.1 15.9(H) 7.3  Hemoglobin 13.0 - 17.0 g/dL 12.0(L) 12.2(L) 11.9(L)  Hematocrit 39.0 - 52.0 % 36.4(L) 35.8(L) 35.5(L)  Platelets 150 - 400 K/uL 262 333 319   CMP Latest Ref Rng & Units 10/13/2021 10/11/2021 07/28/2019  Glucose 70 - 99 mg/dL 208(H) 103(H) 105(H)  BUN 6 - 20 mg/dL 18 10 7   Creatinine 0.61 - 1.24 mg/dL 0.86 0.62 0.70  Sodium 135 - 145 mmol/L 135 137 140  Potassium 3.5 - 5.1 mmol/L 3.8 3.9 3.5  Chloride 98 - 111 mmol/L 107 97(L) 103  CO2 22 - 32 mmol/L 19(L) 30 27  Calcium 8.9 - 10.3 mg/dL 9.0 9.2 9.0  Total Protein 6.5 - 8.1 g/dL - 7.8 -  Total Bilirubin 0.3 - 1.2 mg/dL - 0.4 -  Alkaline Phos 38 - 126 U/L - 68 -  AST 15 - 41 U/L - 12(L) -  ALT 0 - 44 U/L - 9 -     Pertinent Imaging today Plain films and CT images have been personally visualized and interpreted; radiology reports have been reviewed. Decision making incorporated into the Impression / Recommendations.  TTE 10/12/21 1. Left ventricular ejection fraction, by estimation, is 55 to 60%. The  left ventricle has normal function. The left ventricle has no regional  wall motion abnormalities. Left ventricular diastolic parameters are  indeterminate.   2. Right ventricular systolic function is normal. The right ventricular  size is normal. Tricuspid regurgitation signal is inadequate for assessing  PA pressure.   3. The mitral valve is normal in structure. No evidence of mitral valve  regurgitation. No evidence of mitral stenosis.   4. There is a poorly visualized highly mobile small mass (frame 35, 99)  on the anterior leaflet of the tricuspid valve. Recommend TEE for  clarification, given the current  clinical setting a vegetation can not  ruled out.   5. The aortic valve has an indeterminant number of cusps. Aortic valve  regurgitation is not visualized. No aortic stenosis is present.   6. The inferior vena cava is normal in size with greater than 50%  respiratory variability, suggesting right atrial pressure of 3 mmHg.   Conclusion(s)/Recommendation(s): Findings concerning for vegetation on the  anterior leaflet of the tricuspid valve, would recommend Transesophageal  Echocardiogram for clarification.   I spent more than 40 minutes for this patient encounter including review of prior medical records, coordination of care  with  greater than 50% of time being face to face/counseling and discussing diagnostics/treatment plan with the patient/family.  Electronically signed by:   Rosiland Oz, MD Infectious Disease Physician [facility] Pager: [phone]

## 2021-10-14 NOTE — Progress Notes (Signed)
Obtained the consent form and placed in pt's chart.   Ashleen Demma S Shakedra Beam, RN  

## 2021-10-14 NOTE — Progress Notes (Signed)
PROGRESS NOTE    [name]  ZOX:[id number] DOB: 1991/11/14 DOA: 10/11/2021 PCP: Pcp, No    Brief Narrative:  Mr. Decola was admitted to the hospital with the working diagnosis of neck abscess, right lower neck, complicated with right internal jugular vein thrombophlebitis.   30 year old male past medical history for IV drug abuse, seizure disorder, opioid addiction and chronic headaches.  He presented painful neck edema on the right side.  Reported 3-4 days of edema on the right side of his neck, that progressed into his face.  Denied any dyspnea or dysphagia, but positive fever.  On his initial physical examination his blood pressure was 109/77, heart rate 87, respirate 16, oxygen saturation 98%, his lungs are clear to auscultation bilaterally, heart S1-S2, present, rhythmic, soft abdomen, lower extremity edema.  Positive right-sided neck edema.  Sodium 137, potassium 3.9, chloride 97, bicarb 30, glucose 103, BUN 10, creatinine 0.62, white count 9.5, hemoglobin 12.3, hematocrit 37.4, platelets 309. SARS COVID-19 negative.  Soft tissue CT neck with right internal jugular vein thrombophlebitis with extensive surrounding cellulitis/phlegmon and abscess in the right lower neck at the level of thyroid.  Surrounding spread of infection with retropharyngeal and hypopharyngeal edema and overlying sternocleidomastoid myositis.  Multiple chronic linear foraging bodies in the right lower neck possibly retained needles.  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  EKG 99 bpm, normal axis, normal intervals, sinus rhythm, no significant ST segment T wave changes.  Patient was placed on broad-spectrum antibiotic therapy.  11/13 transferred from [facility] to [facility] for ENT and vascular surgery evaluation.  Recommendations for conservative medical management, including anticoagulation.  11/16 Patient with no significant improvement in neck abscess.   Assessment & Plan:   Principal Problem:   Neck  abscess Active Problems:   Thrombophlebitis of internal jugular vein   Seizure disorder (HCC)   Foreign body of neck   Opioid use disorder, moderate, in sustained remission (HCC)   Back pain   Tricuspid valve vegetation   Right sided neck abscess, complicated with right internal jugular vein thrombophlebitis.  Patient has been on broad spectrum antibiotic therapy with vancomycin, levofloxacin and metronidazole. 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  Wbc is 10.1 and cultures remained non growth. In 2020 left neck abscess was positive for MRSA.   Plan to continue antibiotic therapy with broad spectrum antibiotic, including MRSA coverage. Repeat CT neck, patient likely will need I&D or IR drainage.  Continue pain control with oral oxycodone and hydromorphone, will increase dose of hydromorphone to 2 mg due to uncontrolled, pain,  Add scheduled acetaminophen.   Continue anticoagulation on enoxaparin, in preparation for possible surgical procedure to his neck.   Patient had a tricuspid vegetation on transthoracic echocardiography, plan for TEE per cardiology team.   2. Seizure disorder.  Continue with keppra.  3. Chronic pain syndrome and substance abuse disorder. Continue with pain control with opiate analgesics, at home patient with buprenorphine.  On cyclobenzaprine as needed.    Patient continue to be at high risk for worsening abscess.   Status is: Inpatient  Remains inpatient appropriate because: IV antibiotic therapy   DVT prophylaxis: Enoxaparin   Code Status:   full  Family Communication:   No family at the bedside      Consultants:  Id    Antimicrobials:  Levofloxacin  Metronidazole Vancomycin     Subjective:  Patient with no nausea or vomiting, complains of odynophagia but not dysphagia, no dyspnea. No chest  pain.   Objective: Vitals:   10/14/21 0415 10/14/21 0421 10/14/21 0845 10/14/21 1153  BP: (!) 100/59 100/62 98/60 101/61   Pulse: 75 73 73 84  Resp: 12 15 16 18   Temp: 98 F (36.7 C) 97.6 F (36.4 C) 97.6 F (36.4 C) 98 F (36.7 C)  TempSrc: Oral Oral Oral Oral  SpO2: 98%  96% 98%  Weight:      Height:        Intake/Output Summary (Last 24 hours) at 10/14/2021 1321 Last data filed at 10/14/2021 0348 Gross per 24 hour  Intake 1397.28 ml  Output --  Net 1397.28 ml   Filed Weights   10/11/21 1231  Weight: 77.1 kg    Examination:   General: Not in pain or dyspnea., deconditioned and in pain Neurology: Awake and alert, non focal  E ENT: no pallor, no icterus, oral mucosa moist. Tender right neck at the base, with positive erythema.  Cardiovascular: No JVD. S1-S2 present, rhythmic, no gallops, rubs, or murmurs. No lower extremity edema. Pulmonary: positive breath sounds bilaterally, with no wheezing, rhonchi or rales. Gastrointestinal. Abdomen soft and non tender Skin. No rashes Musculoskeletal: no joint deformities     Data Reviewed: I have personally reviewed following labs and imaging studies  CBC: Recent Labs  Lab 10/11/21 1330 10/12/21 0757 10/13/21 0440 10/14/21 0134  WBC 9.5 7.3 15.9* 10.1  NEUTROABS 7.3  --   --   --   HGB 12.3* 11.9* 12.2* 12.0*  HCT 37.4* 35.5* 35.8* 36.4*  MCV 86.2 84.9 83.8 86.3  PLT 309 319 333 262   Basic Metabolic Panel: Recent Labs  Lab 10/11/21 1330 10/13/21 0139  NA 137 135  K 3.9 3.8  CL 97* 107  CO2 30 19*  GLUCOSE 103* 208*  BUN 10 18  CREATININE 0.62 0.86  CALCIUM 9.2 9.0   GFR: Estimated Creatinine Clearance: 121.6 mL/min (by C-G formula based on SCr of 0.86 mg/dL). Liver Function Tests: Recent Labs  Lab 10/11/21 1330  AST 12*  ALT 9  ALKPHOS 68  BILITOT 0.4  PROT 7.8  ALBUMIN 4.1   Recent Labs  Lab 10/11/21 1330  LIPASE 28   No results for input(s): AMMONIA in the last 168 hours. Coagulation Profile: No results for input(s): INR, PROTIME in the last 168 hours. Cardiac Enzymes: No results for input(s): CKTOTAL,  CKMB, CKMBINDEX, TROPONINI in the last 168 hours. BNP (last 3 results) No results for input(s): PROBNP in the last 8760 hours. HbA1C: No results for input(s): HGBA1C in the last 72 hours. CBG: No results for input(s): GLUCAP in the last 168 hours. Lipid Profile: No results for input(s): CHOL, HDL, LDLCALC, TRIG, CHOLHDL, LDLDIRECT in the last 72 hours. Thyroid Function Tests: No results for input(s): TSH, T4TOTAL, FREET4, T3FREE, THYROIDAB in the last 72 hours. Anemia Panel: No results for input(s): VITAMINB12, FOLATE, FERRITIN, TIBC, IRON, RETICCTPCT in the last 72 hours.    Radiology Studies: I have reviewed all of the imaging during this hospital visit personally     Scheduled Meds:  enoxaparin (LOVENOX) injection  120 mg Subcutaneous Q24H   levETIRAcetam  750 mg Oral BID   levofloxacin  750 mg Oral q1800   metroNIDAZOLE  500 mg Oral BID   polyethylene glycol  17 g Oral BID   senna  1 tablet Oral QHS   topiramate  100 mg Oral BID   Continuous Infusions:   LOS: 3 days        York Ram  Edric Fetterman, MD

## 2021-10-14 NOTE — Plan of Care (Signed)
  Problem: Health Behavior/Discharge Planning: Goal: Ability to manage health-related needs will improve Outcome: Progressing   Problem: Clinical Measurements: Goal: Will remain free from infection Outcome: Progressing   Problem: Activity: Goal: Risk for activity intolerance will decrease Outcome: Progressing   Problem: Nutrition: Goal: Adequate nutrition will be maintained Outcome: Progressing   

## 2021-10-14 NOTE — Progress Notes (Signed)
Pharmacy Antibiotic Note  [name] is a 30 y.o. male admitted on 10/11/2021 with  neck abscess/cellulitis .  Pharmacy has been consulted for vanc dosing along with levaquin/flagyl.   Pt is on D4 vanc/levofloxacin/flagyl for neck abscess and possible endocarditis. Pending TEE to confirm.  VP 38 / VT 15 - > AUC of 705  Plan: Decrease Vancomycin to 1 gram iv Q 12 hours with new predicted AUC of 528 Continue to follow    Height: 5\' 6"  (167.6 cm) Weight: 77.1 kg (170 lb) IBW/kg (Calculated) : 63.8  Temp (24hrs), Avg:97.7 F (36.5 C), Min:97.6 F (36.4 C), Max:98 F (36.7 C)  Recent Labs  Lab 10/11/21 1330 10/11/21 2020 10/12/21 0757 10/13/21 0139 10/13/21 0440 10/14/21 0134 10/14/21 1310 10/14/21 2000  WBC 9.5  --  7.3  --  15.9* 10.1  --   --   CREATININE 0.62  --   --  0.86  --   --   --   --   LATICACIDVEN 0.9 0.8  --   --   --   --   --   --   VANCOTROUGH  --   --   --   --   --   --   --  15  VANCOPEAK  --   --   --   --   --   --  38  --      Estimated Creatinine Clearance: 121.6 mL/min (by C-G formula based on SCr of 0.86 mg/dL).    Allergies  Allergen Reactions   Amoxicillin Other (See Comments)    Steven-Johnson's Syndrome   Ceclor [Cefaclor] Other (See Comments)    Steven-Johnson's Syndrome   Erythromycin Other (See Comments)    Steven-Johnson's Syndrome   Penicillins Other (See Comments)    Steven-Johnson's Syndrome   Septra [Sulfamethoxazole-Trimethoprim] Other (See Comments)    Steven-Johnson's Syndrome   Tylenol [Acetaminophen] Other (See Comments)    Unknown reaction - doctor told pt not to take    11/13 vanc>> 11/13 levaquin>> 11/13 metronidazole>>   11/13 blood>>ngtd Resolved hep C - repeating labs MRSA neg Hep B neg  Thank you Anette Guarneri, PharmD 10/14/2021 9:28 PM

## 2021-10-14 NOTE — Progress Notes (Signed)
    [facility] has been requested to perform a transesophageal echocardiogram on [name] for endocarditis.  After careful review of history and examination, the risks and benefits of transesophageal echocardiogram have been explained including risks of esophageal damage, perforation (1:10,000 risk), bleeding, pharyngeal hematoma as well as other potential complications associated with conscious sedation including aspiration, arrhythmia, respiratory failure and death. Alternatives to treatment were discussed, questions were answered. Patient is willing to proceed.   Pt is scheduled for TEE tomorrow 10/15/21 at 1300. NPO at MN please.  Tami Lin Eyan Hagood, [place]  10/14/2021 3:29 PM

## 2021-10-14 NOTE — H&P (View-Only) (Signed)
    [facility] has been requested to perform a transesophageal echocardiogram on [name] for endocarditis.  After careful review of history and examination, the risks and benefits of transesophageal echocardiogram have been explained including risks of esophageal damage, perforation (1:10,000 risk), bleeding, pharyngeal hematoma as well as other potential complications associated with conscious sedation including aspiration, arrhythmia, respiratory failure and death. Alternatives to treatment were discussed, questions were answered. Patient is willing to proceed.   Pt is scheduled for TEE tomorrow 10/15/21 at 1300. NPO at MN please.  Tami Lin Jerlisa Diliberto, [place]  10/14/2021 3:29 PM

## 2021-10-15 ENCOUNTER — Encounter (HOSPITAL_COMMUNITY)
Admission: EM | Disposition: A | Discharge status: Home / Self Care | Payer: Self-pay | Source: Home / Self Care | Attending: Internal Medicine

## 2021-10-15 ENCOUNTER — Inpatient Hospital Stay (HOSPITAL_COMMUNITY): Payer: Medicaid Other | Admitting: Anesthesiology

## 2021-10-15 ENCOUNTER — Inpatient Hospital Stay (HOSPITAL_COMMUNITY): Payer: Medicaid Other

## 2021-10-15 ENCOUNTER — Encounter (HOSPITAL_COMMUNITY): Payer: Self-pay | Admitting: Internal Medicine

## 2021-10-15 DIAGNOSIS — I808 Phlebitis and thrombophlebitis of other sites: Secondary | ICD-10-CM | POA: Diagnosis not present

## 2021-10-15 DIAGNOSIS — M545 Low back pain, unspecified: Secondary | ICD-10-CM | POA: Diagnosis not present

## 2021-10-15 DIAGNOSIS — L0211 Cutaneous abscess of neck: Secondary | ICD-10-CM | POA: Diagnosis not present

## 2021-10-15 DIAGNOSIS — I38 Endocarditis, valve unspecified: Secondary | ICD-10-CM

## 2021-10-15 DIAGNOSIS — F1121 Opioid dependence, in remission: Secondary | ICD-10-CM | POA: Diagnosis not present

## 2021-10-15 HISTORY — PX: INCISION AND DRAINAGE ABSCESS: SHX5864

## 2021-10-15 HISTORY — PX: TEE WITHOUT CARDIOVERSION: SHX5443

## 2021-10-15 LAB — CBC
HCT: 35.9 % — ABNORMAL LOW (ref 39.0–52.0)
Hemoglobin: 11.8 g/dL — ABNORMAL LOW (ref 13.0–17.0)
MCH: 28.4 pg (ref 26.0–34.0)
MCHC: 32.9 g/dL (ref 30.0–36.0)
MCV: 86.3 fL (ref 80.0–100.0)
Platelets: 219 10*3/uL (ref 150–400)
RBC: 4.16 MIL/uL — ABNORMAL LOW (ref 4.22–5.81)
RDW: 12.3 % (ref 11.5–15.5)
WBC: 6.8 10*3/uL (ref 4.0–10.5)
nRBC: 0 % (ref 0.0–0.2)

## 2021-10-15 LAB — BASIC METABOLIC PANEL
Anion gap: 9 (ref 5–15)
BUN: 12 mg/dL (ref 6–20)
CO2: 21 mmol/L — ABNORMAL LOW (ref 22–32)
Calcium: 8.1 mg/dL — ABNORMAL LOW (ref 8.9–10.3)
Chloride: 104 mmol/L (ref 98–111)
Creatinine, Ser: 0.78 mg/dL (ref 0.61–1.24)
GFR, Estimated: >60 mL/min (ref >60)
Glucose, Bld: 111 mg/dL — ABNORMAL HIGH (ref 70–99)
Potassium: 3.1 mmol/L — ABNORMAL LOW (ref 3.5–5.1)
Sodium: 134 mmol/L — ABNORMAL LOW (ref 135–145)

## 2021-10-15 SURGERY — INCISION AND DRAINAGE, ABSCESS
Anesthesia: General | Site: Neck | Laterality: Right

## 2021-10-15 SURGERY — ECHOCARDIOGRAM, TRANSESOPHAGEAL
Anesthesia: Monitor Anesthesia Care

## 2021-10-15 MED ORDER — FENTANYL CITRATE (PF) 100 MCG/2ML IJ SOLN
INTRAMUSCULAR | Status: AC
  Filled 2021-10-15: qty 2

## 2021-10-15 MED ORDER — ONDANSETRON HCL 4 MG/2ML IJ SOLN: 4.0000 mg | Freq: Once | INTRAMUSCULAR | Status: DC | PRN

## 2021-10-15 MED ORDER — DEXAMETHASONE SODIUM PHOSPHATE 4 MG/ML IJ SOLN
INTRAMUSCULAR | Status: DC | PRN
  Administered 2021-10-15: 8 mg via INTRAVENOUS

## 2021-10-15 MED ORDER — DEXMEDETOMIDINE (PRECEDEX) IN NS 20 MCG/5ML (4 MCG/ML) IV SYRINGE
PREFILLED_SYRINGE | INTRAVENOUS | Status: DC | PRN
  Administered 2021-10-15 (×2): 4 ug via INTRAVENOUS
  Administered 2021-10-15: 8 ug via INTRAVENOUS
  Administered 2021-10-15: 4 ug via INTRAVENOUS

## 2021-10-15 MED ORDER — KETOROLAC TROMETHAMINE 30 MG/ML IJ SOLN: 30.0000 mg | Freq: Once | INTRAMUSCULAR | Status: DC | PRN

## 2021-10-15 MED ORDER — FENTANYL CITRATE (PF) 100 MCG/2ML IJ SOLN
INTRAMUSCULAR | Status: DC | PRN
  Administered 2021-10-15: 100 ug via INTRAVENOUS
  Administered 2021-10-15: 150 ug via INTRAVENOUS

## 2021-10-15 MED ORDER — 0.9 % SODIUM CHLORIDE (POUR BTL) OPTIME
TOPICAL | Status: DC | PRN
  Administered 2021-10-15: 18:00:00 1000 mL

## 2021-10-15 MED ORDER — SODIUM CHLORIDE 0.9 % IV SOLN: INTRAVENOUS | Status: DC

## 2021-10-15 MED ORDER — OXYCODONE HCL 5 MG PO TABS: 5.0000 mg | ORAL_TABLET | Freq: Once | ORAL | Status: DC | PRN

## 2021-10-15 MED ORDER — CHLORHEXIDINE GLUCONATE 0.12 % MT SOLN: 15.0000 mL | Freq: Once | OROMUCOSAL | Status: AC

## 2021-10-15 MED ORDER — SUGAMMADEX SODIUM 200 MG/2ML IV SOLN
INTRAVENOUS | Status: DC | PRN
  Administered 2021-10-15: 154.2 mg via INTRAVENOUS

## 2021-10-15 MED ORDER — LACTATED RINGERS IV SOLN: INTRAVENOUS | Status: DC

## 2021-10-15 MED ORDER — ONDANSETRON HCL 4 MG/2ML IJ SOLN
INTRAMUSCULAR | Status: DC | PRN
  Administered 2021-10-15: 4 mg via INTRAVENOUS

## 2021-10-15 MED ORDER — LIDOCAINE 2% (20 MG/ML) 5 ML SYRINGE
INTRAMUSCULAR | Status: DC | PRN
  Administered 2021-10-15: 80 mg via INTRAVENOUS

## 2021-10-15 MED ORDER — MIDAZOLAM HCL 5 MG/5ML IJ SOLN
INTRAMUSCULAR | Status: DC | PRN
  Administered 2021-10-15: 4 mg via INTRAVENOUS

## 2021-10-15 MED ORDER — SODIUM CHLORIDE 0.9 % IV SOLN
INTRAVENOUS | Status: AC | PRN
  Administered 2021-10-15: 500 mL via INTRAVENOUS

## 2021-10-15 MED ORDER — MIDAZOLAM HCL 2 MG/2ML IJ SOLN
INTRAMUSCULAR | Status: AC
  Filled 2021-10-15: qty 4

## 2021-10-15 MED ORDER — LIDOCAINE-EPINEPHRINE 1 %-1:100000 IJ SOLN
INTRAMUSCULAR | Status: AC
  Filled 2021-10-15: qty 1

## 2021-10-15 MED ORDER — FENTANYL CITRATE (PF) 100 MCG/2ML IJ SOLN
25.0000 ug | INTRAMUSCULAR | Status: DC | PRN
  Administered 2021-10-15: 19:00:00 25 ug via INTRAVENOUS

## 2021-10-15 MED ORDER — OXYCODONE HCL 5 MG/5ML PO SOLN: 5.0000 mg | Freq: Once | ORAL | Status: DC | PRN

## 2021-10-15 MED ORDER — PROPOFOL 10 MG/ML IV BOLUS
INTRAVENOUS | Status: DC | PRN
  Administered 2021-10-15: 150 mg via INTRAVENOUS

## 2021-10-15 MED ORDER — ROCURONIUM BROMIDE 10 MG/ML (PF) SYRINGE
PREFILLED_SYRINGE | INTRAVENOUS | Status: DC | PRN
  Administered 2021-10-15: 30 mg via INTRAVENOUS

## 2021-10-15 MED ORDER — FENTANYL CITRATE (PF) 250 MCG/5ML IJ SOLN
INTRAMUSCULAR | Status: AC
  Filled 2021-10-15: qty 5

## 2021-10-15 MED ORDER — LIDOCAINE-EPINEPHRINE 1 %-1:100000 IJ SOLN
INTRAMUSCULAR | Status: DC | PRN
  Administered 2021-10-15: 1 mL

## 2021-10-15 MED ORDER — POTASSIUM CHLORIDE 10 MEQ/100ML IV SOLN
10.0000 meq | INTRAVENOUS | Status: AC
  Administered 2021-10-15: 15:00:00 10 meq via INTRAVENOUS
  Filled 2021-10-15 (×4): qty 100

## 2021-10-15 MED ORDER — ORAL CARE MOUTH RINSE: 15.0000 mL | Freq: Once | OROMUCOSAL | Status: AC

## 2021-10-15 MED ORDER — CHLORHEXIDINE GLUCONATE 0.12 % MT SOLN
OROMUCOSAL | Status: AC
  Administered 2021-10-15: 16:00:00 15 mL via OROMUCOSAL
  Filled 2021-10-15: qty 15

## 2021-10-15 MED ORDER — LACTATED RINGERS IV SOLN: INTRAVENOUS | Status: DC | PRN

## 2021-10-15 MED ORDER — PROPOFOL 500 MG/50ML IV EMUL
INTRAVENOUS | Status: DC | PRN
  Administered 2021-10-15: 150 ug/kg/min via INTRAVENOUS
  Administered 2021-10-15: 200 ug/kg/min via INTRAVENOUS

## 2021-10-15 MED ORDER — POTASSIUM CHLORIDE 10 MEQ/100ML IV SOLN
10.0000 meq | INTRAVENOUS | Status: AC
  Administered 2021-10-15 (×3): 10 meq via INTRAVENOUS
  Filled 2021-10-15: qty 100

## 2021-10-15 SURGICAL SUPPLY — 48 items
ATTRACTOMAT 16X20 MAGNETIC DRP (DRAPES) IMPLANT
BAG COUNTER SPONGE SURGICOUNT (BAG) ×2 IMPLANT
BLADE SURG 15 STRL LF DISP TIS (BLADE) IMPLANT
BLADE SURG 15 STRL SS (BLADE)
BNDG GAUZE ELAST 4 BULKY (GAUZE/BANDAGES/DRESSINGS) ×2 IMPLANT
CANISTER SUCT 3000ML PPV (MISCELLANEOUS) ×2 IMPLANT
CLEANER TIP ELECTROSURG 2X2 (MISCELLANEOUS) ×2 IMPLANT
CNTNR URN SCR LID CUP LEK RST (MISCELLANEOUS) ×1 IMPLANT
CONT SPEC 4OZ STRL OR WHT (MISCELLANEOUS) ×2
CORD BIPOLAR FORCEPS 12FT (ELECTRODE) ×2 IMPLANT
COVER SURGICAL LIGHT HANDLE (MISCELLANEOUS) ×2 IMPLANT
DRAIN HEMOVAC 7FR (DRAIN) IMPLANT
DRAIN PENROSE 1/4X12 LTX STRL (WOUND CARE) IMPLANT
DRAPE HALF SHEET 40X57 (DRAPES) IMPLANT
DRSG TELFA 3X8 NADH (GAUZE/BANDAGES/DRESSINGS) ×2 IMPLANT
ELECT COATED BLADE 2.86 ST (ELECTRODE) ×2 IMPLANT
ELECT REM PT RETURN 9FT ADLT (ELECTROSURGICAL) ×2
ELECT REM PT RETURN 9FT PED (ELECTROSURGICAL)
ELECTRODE REM PT RETRN 9FT PED (ELECTROSURGICAL) IMPLANT
ELECTRODE REM PT RTRN 9FT ADLT (ELECTROSURGICAL) ×1 IMPLANT
EVACUATOR SILICONE 100CC (DRAIN) IMPLANT
GAUZE SPONGE 4X4 12PLY STRL (GAUZE/BANDAGES/DRESSINGS) ×2 IMPLANT
GLOVE SURG ENC TEXT LTX SZ7 (GLOVE) ×4 IMPLANT
GOWN STRL REUS W/ TWL LRG LVL3 (GOWN DISPOSABLE) ×2 IMPLANT
GOWN STRL REUS W/TWL LRG LVL3 (GOWN DISPOSABLE) ×4
KIT BASIN OR (CUSTOM PROCEDURE TRAY) ×2 IMPLANT
KIT TURNOVER KIT B (KITS) ×2 IMPLANT
LOCATOR NERVE 3 VOLT (DISPOSABLE) IMPLANT
NEEDLE HYPO 25GX1X1/2 BEV (NEEDLE) IMPLANT
NS IRRIG 1000ML POUR BTL (IV SOLUTION) ×2 IMPLANT
PAD ARMBOARD 7.5X6 YLW CONV (MISCELLANEOUS) ×4 IMPLANT
PENCIL SMOKE EVACUATOR (MISCELLANEOUS) ×2 IMPLANT
STAPLER VISISTAT 35W (STAPLE) ×2 IMPLANT
SUT ETHILON 2 0 FS 18 (SUTURE) ×2 IMPLANT
SUT ETHILON 3 0 PS 1 (SUTURE) IMPLANT
SUT ETHILON 5 0 PS 2 18 (SUTURE) IMPLANT
SUT SILK 2 0 PERMA HAND 18 BK (SUTURE) IMPLANT
SUT SILK 3 0 REEL (SUTURE) IMPLANT
SUT VIC AB 3-0 PS2 18 (SUTURE)
SUT VIC AB 3-0 PS2 18XBRD (SUTURE) IMPLANT
SUT VIC AB 4-0 P-3 18X BRD (SUTURE) IMPLANT
SUT VIC AB 4-0 P3 18 (SUTURE)
SWAB COLLECTION DEVICE MRSA (MISCELLANEOUS) IMPLANT
SWAB CULTURE ESWAB REG 1ML (MISCELLANEOUS) IMPLANT
TAPE CLOTH 4X10 WHT NS (GAUZE/BANDAGES/DRESSINGS) ×2 IMPLANT
TOWEL GREEN STERILE FF (TOWEL DISPOSABLE) ×2 IMPLANT
TRAY ENT MC OR (CUSTOM PROCEDURE TRAY) ×2 IMPLANT
WATER STERILE IRR 1000ML POUR (IV SOLUTION) ×2 IMPLANT

## 2021-10-15 NOTE — Anesthesia Preprocedure Evaluation (Addendum)
Anesthesia Evaluation  Patient identified by MRN, date of birth, ID band Patient awake    Reviewed: Allergy & Precautions, NPO status , Patient's Chart, lab work & pertinent test results  Airway Mallampati: II  TM Distance: >3 FB Neck ROM: Full    Dental  (+) Dental Advisory Given, Teeth Intact   Pulmonary neg pulmonary ROS, Current Smoker,    Pulmonary exam normal breath sounds clear to auscultation       Cardiovascular Normal cardiovascular exam Rhythm:Regular Rate:Normal  Echo 09/2021 1. Left ventricular ejection fraction, by estimation, is 55 to 60%. The left ventricle has normal function. The left ventricle has no regional wall motion abnormalities. Left ventricular diastolic parameters are indeterminate.  2. Right ventricular systolic function is normal. The right ventricular size is normal. Tricuspid regurgitation signal is inadequate for assessing PA pressure.  3. The mitral valve is normal in structure. No evidence of mitral valve regurgitation. No evidence of mitral stenosis.  4. There is a poorly visualized highly mobile small mass (frame 35, 99) on the anterior leaflet of the tricuspid valve. Recommend TEE for clarification, given the current clinical setting a vegetation can not ruled out.  5. The aortic valve has an indeterminant number of cusps. Aortic valve regurgitation is not visualized. No aortic stenosis is present.  6. The inferior vena cava is normal in size with greater than 50% respiratory variability, suggesting right atrial pressure of 3 mmHg.    Neuro/Psych Seizures -,     GI/Hepatic negative GI ROS, (+) Hepatitis -  Endo/Other  negative endocrine ROS  Renal/GU negative Renal ROS     Musculoskeletal negative musculoskeletal ROS (+)   Abdominal   Peds  Hematology  (+) Blood dyscrasia, anemia ,   Anesthesia Other Findings   Reproductive/Obstetrics                             Anesthesia Physical Anesthesia Plan  ASA: 3  Anesthesia Plan: MAC   Post-op Pain Management:    Induction: Intravenous  PONV Risk Score and Plan: 1 and Ondansetron, Treatment may vary due to age or medical condition and Propofol infusion  Airway Management Planned: Natural Airway  Additional Equipment: None  Intra-op Plan:   Post-operative Plan:   Informed Consent: I have reviewed the patients History and Physical, chart, labs and discussed the procedure including the risks, benefits and alternatives for the proposed anesthesia with the patient or authorized representative who has indicated his/her understanding and acceptance.     Dental advisory given  Plan Discussed with: CRNA  Anesthesia Plan Comments:       Anesthesia Quick Evaluation

## 2021-10-15 NOTE — Anesthesia Procedure Notes (Signed)
Procedure Name: Intubation Date/Time: 10/15/2021 6:02 PM Performed by: Minerva Ends, CRNA Pre-anesthesia Checklist: Patient identified, Emergency Drugs available, Suction available and Patient being monitored Patient Re-evaluated:Patient Re-evaluated prior to induction Oxygen Delivery Method: Circle system utilized Preoxygenation: Pre-oxygenation with 100% oxygen Induction Type: IV induction Ventilation: Mask ventilation without difficulty Laryngoscope Size: Mac and 3 Grade View: Grade III Tube type: Oral Tube size: 7.0 mm Number of attempts: 1 Airway Equipment and Method: Stylet and Oral airway Placement Confirmation: ETT inserted through vocal cords under direct vision, positive ETCO2 and breath sounds checked- equal and bilateral Secured at: 23 cm Tube secured with: Tape Dental Injury: Teeth and Oropharynx as per pre-operative assessment

## 2021-10-15 NOTE — Progress Notes (Signed)
  Echocardiogram Echocardiogram Transesophageal has been performed.  Fidel Levy 10/15/2021, 2:33 PM

## 2021-10-15 NOTE — Transfer of Care (Signed)
Immediate Anesthesia Transfer of Care Note  Patient: [name]  Procedure(s) Performed: TRANSESOPHAGEAL ECHOCARDIOGRAM (TEE)  Patient Location: PACU  Anesthesia Type:MAC  Level of Consciousness: drowsy  Airway & Oxygen Therapy: Patient Spontanous Breathing and Patient connected to nasal cannula oxygen  Post-op Assessment: Report given to RN and Post -op Vital signs reviewed and stable  Post vital signs: Reviewed and stable  Last Vitals:  Vitals Value Taken Time  BP 89/57 10/15/21 1359  Temp 36.8 C 10/15/21 1356  Pulse 80 10/15/21 1400  Resp 13 10/15/21 1400  SpO2 97 % 10/15/21 1400  Vitals shown include unvalidated device data.  Last Pain:  Vitals:   10/15/21 1356  TempSrc: Temporal  PainSc:       Patients Stated Pain Goal: 0 (28/90/22 8406)  Complications: No notable events documented.

## 2021-10-15 NOTE — Anesthesia Postprocedure Evaluation (Signed)
Anesthesia Post Note  Patient: [name]  Procedure(s) Performed: TRANSESOPHAGEAL ECHOCARDIOGRAM (TEE)     Patient location during evaluation: PACU Anesthesia Type: MAC Level of consciousness: sedated and patient cooperative Pain management: pain level controlled Vital Signs Assessment: post-procedure vital signs reviewed and stable Respiratory status: spontaneous breathing Cardiovascular status: stable Anesthetic complications: no   No notable events documented.  Last Vitals:  Vitals:   10/15/21 1402 10/15/21 1415  BP: (!) 88/55 94/63  Pulse: 78 78  Resp: 13 20  Temp:    SpO2: 96% 99%    Last Pain:  Vitals:   10/15/21 1356  TempSrc: Temporal  PainSc: [name]

## 2021-10-15 NOTE — CV Procedure (Signed)
Brief TEE Note  LVEF 60-65% No LA/LAA thrombus or mass The tricuspid valve leaflets are thin and do not appear to have endocarditis.  However, the tricuspid valve annulus is thickened and there is an echodensity in the right ventricle adjacent to the annulus.  Recommend additional imaging with gated cardiac CT-A or cardiac MRI to better assess.  Cannot rule out abscess in this region. Trivial TR.  For additional details see full report.   Manuel Whan C. Oval Linsey, MD, [facility] 10/15/2021 2:08 PM

## 2021-10-15 NOTE — Transfer of Care (Signed)
Immediate Anesthesia Transfer of Care Note  Patient: [name]  Procedure(s) Performed: INCISION AND DRAINAGE ABSCESS (Right)  Patient Location: PACU  Anesthesia Type:General  Level of Consciousness: awake, alert  and oriented  Airway & Oxygen Therapy: Patient Spontanous Breathing  Post-op Assessment: Report given to RN and Post -op Vital signs reviewed and stable  Post vital signs: Reviewed and stable  Last Vitals:  Vitals Value Taken Time  BP 93/79 10/15/21 1838  Temp    Pulse 97 10/15/21 1838  Resp 11 10/15/21 1838  SpO2 99 % 10/15/21 1838  Vitals shown include unvalidated device data.  Last Pain:  Vitals:   10/15/21 1621  TempSrc: Oral  PainSc: 10-Worst pain ever      Patients Stated Pain Goal: 0 (74/16/38 4536)  Complications: No notable events documented.

## 2021-10-15 NOTE — Progress Notes (Addendum)
PROGRESS NOTE    [name]  JOA:[id number] DOB: 1991/03/03 DOA: 10/11/2021 PCP: Pcp, No    Brief Narrative:  [name] was admitted to the hospital with the working diagnosis of neck abscess, right lower neck, complicated with right internal jugular vein thrombophlebitis.    30 year old male past medical history for IV drug abuse, seizure disorder, opioid addiction and chronic headaches.  He presented painful neck edema on the right side.  Reported 3-4 days of edema on the right side of his neck, that progressed into his face.  Denied any dyspnea or dysphagia, but positive fever.  On his initial physical examination his blood pressure was 109/77, heart rate 87, respirate 16, oxygen saturation 98%, his lungs are clear to auscultation bilaterally, heart S1-S2, present, rhythmic, soft abdomen, lower extremity edema.  Positive right-sided neck edema.   Sodium 137, potassium 3.9, chloride 97, bicarb 30, glucose 103, BUN 10, creatinine 0.62, white count 9.5, hemoglobin 12.3, hematocrit 37.4, platelets 309. SARS COVID-19 negative.   Soft tissue CT neck with right internal jugular vein thrombophlebitis with extensive surrounding cellulitis/phlegmon and abscess in the right lower neck at the level of thyroid.  Surrounding spread of infection with retropharyngeal and hypopharyngeal edema and overlying sternocleidomastoid myositis.  Multiple chronic linear foraging bodies in the right lower neck possibly retained needles.  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   EKG 99 bpm, normal axis, normal intervals, sinus rhythm, no significant ST segment T wave changes.   Patient was placed on broad-spectrum antibiotic therapy.   11/13 transferred from [facility] to [facility] for ENT and vascular surgery evaluation.   Recommendations for conservative medical management, including anticoagulation.   11/16 Patient with no significant improvement in neck abscess, follow up neck CT soft tissue with increased  sized abscess associated with right thrombosed internal jugular vein, measuring 2.3 x 2,6 cm. 3 retained hypodermic needles in the nearby soft tissues.   Contacted Dr Annalee Genta from ENT for follow up recommendations.   Assessment & Plan:   Principal Problem:   Neck abscess Active Problems:   Thrombophlebitis of internal jugular vein   Seizure disorder (HCC)   Foreign body of neck   Opioid use disorder, moderate, in sustained remission (HCC)   Back pain   Tricuspid valve vegetation     Right sided neck abscess, complicated with right internal jugular vein thrombophlebitis. 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  Patient with persistent pain and edema at the right neck, he has no dysphagia or dyspnea.  Wbc is 6,8 and cultures continue to be negative Patient will get TEE today to follow up on poorly visualized highly mobile small mass on the anterior leaflet of the tricuspid valve.   Plan to continue antibiotic therapy with vancomycin, levofloxacin and metronidazole. Pain control with oxycodone and hydromorphone Anticoagulation on enoxaparin, pending possible surgical intervention.    2. Seizure disorder.   On keppra and topiramate no active seizures.    3. Chronic pain syndrome and substance abuse disorder.  home patient with buprenorphine.  Continue with cyclobenzaprine as needed.   4, Hypokalemia/ hyponatremia. K today is down to 3,1 with serum cr at 0,78 and bicarbonate at 21. Na 134 Add 40 meq KCL today and follow renal function in am.   Patient continue to be at high risk for worsening abscess   Status is: Inpatient  Remains inpatient appropriate because: IV antibiotic therapy   DVT prophylaxis: Enoxaparin   Code Status:    full  Family Communication:  No family at the bedside     Consultants:  ENT ID   Antimicrobials:  Vancomycin, levofloxacin and metronidazole     Subjective: Patient continue to have right neck pain, moderate to severe in  intensity, worse to touch and movement, with no dyspnea or dysphagia,   Objective: Vitals:   10/15/21 0003 10/15/21 0815 10/15/21 0816 10/15/21 1140  BP: 105/60 106/69 106/69 105/71  Pulse: 83 99 96 88  Resp: 13 19 (!) 23 18  Temp: 97.7 F (36.5 C) 97.9 F (36.6 C) 98.9 F (37.2 C) 98.6 F (37 C)  TempSrc:  Oral Oral Oral  SpO2:  100% 100% 99%  Weight:      Height:        Intake/Output Summary (Last 24 hours) at 10/15/2021 1216 Last data filed at 10/14/2021 2231 Gross per 24 hour  Intake 0 ml  Output --  Net 0 ml   Filed Weights   10/11/21 1231  Weight: 77.1 kg    Examination:   General: Not in pain or dyspnea, deconditioned  Neurology: Awake and alert, non focal  E ENT: no pallor, no icterus, oral mucosa moist. Edema at the right base of the neck. No erythema or open wound.  Cardiovascular: No JVD. S1-S2 present, rhythmic, no gallops, rubs, or murmurs. No lower extremity edema. Pulmonary: vesicular breath sounds bilaterally, adequate air movement, no wheezing, rhonchi or rales. Gastrointestinal. Abdomen soft and non tender Skin. No rashes Musculoskeletal: no joint deformities     Data Reviewed: I have personally reviewed following labs and imaging studies  CBC: Recent Labs  Lab 10/11/21 1330 10/12/21 0757 10/13/21 0440 10/14/21 0134 10/15/21 0105  WBC 9.5 7.3 15.9* 10.1 6.8  NEUTROABS 7.3  --   --   --   --   HGB 12.3* 11.9* 12.2* 12.0* 11.8*  HCT 37.4* 35.5* 35.8* 36.4* 35.9*  MCV 86.2 84.9 83.8 86.3 86.3  PLT 309 319 333 262 219   Basic Metabolic Panel: Recent Labs  Lab 10/11/21 1330 10/13/21 0139 10/15/21 0105  NA 137 135 134*  K 3.9 3.8 3.1*  CL 97* 107 104  CO2 30 19* 21*  GLUCOSE 103* 208* 111*  BUN 10 18 12   CREATININE 0.62 0.86 0.78  CALCIUM 9.2 9.0 8.1*   GFR: Estimated Creatinine Clearance: 130.8 mL/min (by C-G formula based on SCr of 0.78 mg/dL). Liver Function Tests: Recent Labs  Lab 10/11/21 1330  AST 12*  ALT 9   ALKPHOS 68  BILITOT 0.4  PROT 7.8  ALBUMIN 4.1   Recent Labs  Lab 10/11/21 1330  LIPASE 28   No results for input(s): AMMONIA in the last 168 hours. Coagulation Profile: No results for input(s): INR, PROTIME in the last 168 hours. Cardiac Enzymes: No results for input(s): CKTOTAL, CKMB, CKMBINDEX, TROPONINI in the last 168 hours. BNP (last 3 results) No results for input(s): PROBNP in the last 8760 hours. HbA1C: No results for input(s): HGBA1C in the last 72 hours. CBG: No results for input(s): GLUCAP in the last 168 hours. Lipid Profile: No results for input(s): CHOL, HDL, LDLCALC, TRIG, CHOLHDL, LDLDIRECT in the last 72 hours. Thyroid Function Tests: No results for input(s): TSH, T4TOTAL, FREET4, T3FREE, THYROIDAB in the last 72 hours. Anemia Panel: No results for input(s): VITAMINB12, FOLATE, FERRITIN, TIBC, IRON, RETICCTPCT in the last 72 hours.    Radiology Studies: I have reviewed all of the imaging during this hospital visit personally     Scheduled Meds:  acetaminophen  650 mg  Oral Q6H   enoxaparin (LOVENOX) injection  120 mg Subcutaneous Q24H   levETIRAcetam  750 mg Oral BID   levofloxacin  750 mg Oral q1800   metroNIDAZOLE  500 mg Oral BID   polyethylene glycol  17 g Oral BID   senna  1 tablet Oral QHS   topiramate  100 mg Oral BID   Continuous Infusions:  sodium chloride 20 mL/hr at 10/15/21 1043   vancomycin 1,000 mg (10/15/21 1057)     LOS: 4 days        Maricia Scotti Annett Gula, MD

## 2021-10-15 NOTE — Progress Notes (Signed)
[facility] Infectious Diseases Follow Up Note  Patient Identification: Patient Name: [name] MRN: [id number] [place] Date: 10/11/2021 12:27 PM Age: 30 y.o.Today's Date: 10/15/2021   Reason for Visit:Neck abscess/possible endocarditis  Principal Problem:   Neck abscess Active Problems:   Thrombophlebitis of internal jugular vein   Seizure disorder ([place])   Foreign body of neck   Opioid use disorder, moderate, in sustained remission (HCC)   Back pain   Tricuspid valve vegetation  Antibiotics: Vancomycin 11/13                    Levofloxacin 11/13                    Metronidazole 11/13   Lines/Hardware:    Interval Events: remains afebrile, WBC 6.8. CT neck with enlarging abscess. 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 antibiotics).  Stopped taking anticoagulant approximately a year ago IVDU - HIV negative, heB serology with inconsistent immunity  Hepatitis C - HCV RNA negative  Opioid use disorder on Suboxone Tobacco use   Recommendations Continue vancomycin, pharmacy to dose, levofloxacin and metronidazole. This is most likely MRSA related  as he had left sided neck abscess with MRSA in August 2020 fu TEE findings from today  No improvement in pain and swelling in the left side neck. Repeat CT neck with enlarging abscess. IR wants ENT re-eval prior to aspiration. ENT re-eval is pending . No positive cultures for microbiologic diagnosis so far. If no plans for surgical intervention by ENT, recommend IR  guided aspiration for cultures  Follow-up blood cultures Monitor CBC, BMP and Vancomycin trough  Anticoagulation per Primary and Vascular Following   Rest of the management as per the primary team. Thank you for the consult. Please page with pertinent questions or concerns.  ______________________________________________________________________ Subjective patient seen and examined at the bedside. He is NPO, walking to the bathroom. Says left sided neck pain is 10/10 Vitals BP 106/69 (BP Location: Left Arm)   Pulse 96   Temp 98.9 F (37.2 C) (Oral)   Resp (!) 23   Ht 5\' 6"  (1.676 m)   Wt 77.1 kg   SpO2 100%   BMI 27.44 kg/m     Physical Exam Constitutional:  sleeping in bed comfortably. Oral exam with no findings concerning for pharyngitis/tonsillitis    Comments: Left sided neck with swelling/tenderness, no fluctuance noted ( no changes from yesterday's exam)  Cardiovascular:     Rate and Rhythm: Normal rate and regular rhythm.     Heart sounds:   Pulmonary:     Effort: Pulmonary effort is normal.     Comments:   Abdominal:     Palpations: Abdomen is soft.     Tenderness:   Musculoskeletal:        General: No swelling or tenderness.   Skin:    Comments: Tattoos+  Neurological:     General: No focal deficit present.   Psychiatric:        Mood and Affect: Mood normal.   Pertinent Microbiology Results for orders placed or performed during the hospital encounter of 10/11/21  Blood culture (routine x 2)  Status: None (Preliminary result)   Collection Time: 10/11/21  2:15 PM   Specimen: BLOOD  Result Value Ref Range Status   Specimen Description   Final    BLOOD RIGHT ANTECUBITAL Performed at [facility] [address], [place], [place] [address]    Special Requests   Final    BOTTLES DRAWN AEROBIC AND ANAEROBIC Blood Culture adequate volume Performed at [facility] [address], [place], [place] [address]     Culture   Final    NO GROWTH 3 DAYS Performed at [facility], [place] [address], [place], [place] [address]    Report Status PENDING  Incomplete  Resp Panel by RT-PCR (Flu A&B, Covid)     Status: None   Collection Time: 10/11/21  2:26 PM   Specimen: Nasopharyngeal(NP) swabs in vial transport medium  Result Value Ref Range Status   SARS Coronavirus 2 by RT PCR NEGATIVE NEGATIVE Final    Comment: (NOTE) SARS-CoV-2 target nucleic acids are NOT DETECTED.  The SARS-CoV-2 RNA is generally detectable in upper respiratory specimens during the acute phase of infection. The lowest concentration of SARS-CoV-2 viral copies this assay can detect is 138 copies/mL. A negative result does not preclude SARS-Cov-2 infection and should not be used as the sole basis for treatment or other patient management decisions. A negative result may occur with  improper specimen collection/handling, submission of specimen other than nasopharyngeal swab, presence of viral mutation(s) within the areas targeted by this assay, and inadequate number of viral copies(<138 copies/mL). A negative result must be combined with clinical observations, patient history, and epidemiological information. The expected result is Negative.  Fact Sheet for Patients:  EntrepreneurPulse.com.au  Fact Sheet for Healthcare Providers:  IncredibleEmployment.be  This test is no t yet approved or cleared by the [place] FDA and  has been authorized for detection and/or diagnosis of SARS-CoV-2 by FDA under an Emergency Use Authorization (EUA). This EUA will remain  in effect (meaning this test can be used) for the duration of the COVID-19 declaration under Section 564(b)(1) of the Act, 21 U.S.C.section 360bbb-3(b)(1), unless the authorization is terminated  or revoked sooner.       Influenza A by PCR NEGATIVE NEGATIVE Final   Influenza B by PCR NEGATIVE NEGATIVE Final    Comment:  (NOTE) The Xpert Xpress SARS-CoV-2/FLU/RSV plus assay is intended as an aid in the diagnosis of influenza from Nasopharyngeal swab specimens and should not be used as a sole basis for treatment. Nasal washings and aspirates are unacceptable for Xpert Xpress SARS-CoV-2/FLU/RSV testing.  Fact Sheet for Patients: EntrepreneurPulse.com.au  Fact Sheet for Healthcare Providers: IncredibleEmployment.be  This test is not yet approved or cleared by the [place] FDA and has been authorized for detection and/or diagnosis of SARS-CoV-2 by FDA under an Emergency Use Authorization (EUA). This EUA will remain in effect (meaning this test can be used) for the duration of the COVID-19 declaration under Section 564(b)(1) of the Act, 21 U.S.C. section 360bbb-3(b)(1), unless the authorization is terminated or revoked.  Performed at [facility], [place] [address] [place], [place] [address]   Culture, blood (Routine X 2) w Reflex to ID Panel     Status: None (Preliminary result)   Collection Time: 10/11/21  8:20 PM   Specimen: BLOOD  Result Value Ref Range Status   Specimen Description   Final    BLOOD LEFT ANTECUBITAL Performed at [facility] [address] [place],  [place] [address]    Special Requests   Final    BOTTLES DRAWN AEROBIC AND ANAEROBIC Blood Culture adequate volume Performed at [facility] [address], [place], [place] [address]    Culture   Final    NO GROWTH 2 DAYS Performed at [facility] [address], [place], [place] [address]    Report Status PENDING  Incomplete  MRSA Next Gen by PCR, Nasal     Status: None   Collection Time: 10/12/21 12:07 PM   Specimen: Nasal Mucosa; Nasal Swab  Result Value Ref Range Status   MRSA by PCR Next Gen NOT DETECTED NOT DETECTED Final    Comment: (NOTE) The GeneXpert MRSA Assay (FDA approved for NASAL specimens only), is one  component of a comprehensive MRSA colonization surveillance program. It is not intended to diagnose MRSA infection nor to guide or monitor treatment for MRSA infections. Test performance is not FDA approved in patients less than 43 years old. Performed at [facility], [place] [address], [place], [place] [address]     Pertinent Lab. CBC Latest Ref Rng & Units 10/15/2021 10/14/2021 10/13/2021  WBC 4.0 - 10.5 K/uL 6.8 10.1 15.9(H)  Hemoglobin 13.0 - 17.0 g/dL 11.8(L) 12.0(L) 12.2(L)  Hematocrit 39.0 - 52.0 % 35.9(L) 36.4(L) 35.8(L)  Platelets 150 - 400 K/uL 219 262 333   CMP Latest Ref Rng & Units 10/15/2021 10/13/2021 10/11/2021  Glucose 70 - 99 mg/dL 111(H) 208(H) 103(H)  BUN 6 - 20 mg/dL 12 18 10   Creatinine 0.61 - 1.24 mg/dL 0.78 0.86 0.62  Sodium 135 - 145 mmol/L 134(L) 135 137  Potassium 3.5 - 5.1 mmol/L 3.1(L) 3.8 3.9  Chloride 98 - 111 mmol/L 104 107 97(L)  CO2 22 - 32 mmol/L 21(L) 19(L) 30  Calcium 8.9 - 10.3 mg/dL 8.1(L) 9.0 9.2  Total Protein 6.5 - 8.1 g/dL - - 7.8  Total Bilirubin 0.3 - 1.2 mg/dL - - 0.4  Alkaline Phos 38 - 126 U/L - - 68  AST 15 - 41 U/L - - 12(L)  ALT 0 - 44 U/L - - 9     Pertinent Imaging today Plain films and CT images have been personally visualized and interpreted; radiology reports have been reviewed. Decision making incorporated into the Impression / Recommendations.  CT SOFT TISSUE NECK W CONTRAST  Result Date: 10/15/2021 CLINICAL DATA:  Right-sided neck pain.  Possible abscess. EXAM: CT NECK WITH CONTRAST TECHNIQUE: Multidetector CT imaging of the neck was performed using the standard protocol following the bolus administration of intravenous contrast. CONTRAST:  34mL OMNIPAQUE IOHEXOL 300 MG/ML  SOLN COMPARISON:  None. FINDINGS: PHARYNX AND LARYNX: The nasopharynx, oropharynx and larynx are normal. Visible portions of the oral cavity, tongue base and floor of mouth are normal. Normal epiglottis, vallecula and pyriform sinuses. The larynx  is normal. No retropharyngeal abscess, effusion or lymphadenopathy. SALIVARY GLANDS: Normal parotid, submandibular and sublingual glands. THYROID: Normal. LYMPH NODES: No enlarged or abnormal density lymph nodes. VASCULAR: Thrombosed internal jugular vein with increased size of associated abscess that now measures 2.3 x 2.6 cm. There are again noted 3 retained hypodermic needles in the nearby soft tissues. LIMITED INTRACRANIAL: Normal. VISUALIZED ORBITS: Normal. MASTOIDS AND VISUALIZED PARANASAL SINUSES: No fluid levels or advanced mucosal thickening. No mastoid effusion. SKELETON: No bony spinal canal stenosis. No lytic or blastic lesions. UPPER CHEST: Clear. OTHER: None. IMPRESSION: 1. Thrombosed right internal jugular vein with increased size of associated abscess that now measures 2.3  x 2.6 cm. 2. Unchanged appearance of 3 retained hypodermic needles in the nearby soft tissues. Electronically Signed   By: Ulyses Jarred M.D.   On: 10/15/2021 00:01     I spent more than 40 minutes for this patient encounter including review of prior medical records, coordination of care  with greater than 50% of time being face to face/counseling and discussing diagnostics/treatment plan with the patient/family.  Electronically signed by:   Rosiland Oz, MD Infectious Disease Physician [facility] Pager: [phone]

## 2021-10-15 NOTE — Interval H&P Note (Signed)
History and Physical Interval Note:  10/15/2021 12:36 PM  [name]  has presented today for surgery, with the diagnosis of ENDO.  The various methods of treatment have been discussed with the patient and family. After consideration of risks, benefits and other options for treatment, the patient has consented to  Procedure(s): TRANSESOPHAGEAL ECHOCARDIOGRAM (TEE) (N/A) as a surgical intervention.  The patient's history has been reviewed, patient examined, no change in status, stable for surgery.  I have reviewed the patient's chart and labs.  Questions were answered to the patient's satisfaction.     Skeet Latch, MD

## 2021-10-15 NOTE — Anesthesia Preprocedure Evaluation (Signed)
Anesthesia Evaluation  Patient identified by MRN, date of birth, ID band Patient awake    Reviewed: Allergy & Precautions, H&P , NPO status , Patient's Chart, lab work & pertinent test results  Airway Mallampati: II  TM Distance: >3 FB Neck ROM: Full    Dental no notable dental hx.    Pulmonary Current Smoker,    Pulmonary exam normal breath sounds clear to auscultation       Cardiovascular negative cardio ROS Normal cardiovascular exam Rhythm:Regular Rate:Normal     Neuro/Psych Seizures -,  negative psych ROS   GI/Hepatic negative GI ROS, (+)     substance abuse  IV drug use, Hepatitis -, C  Endo/Other  negative endocrine ROS  Renal/GU negative Renal ROS  negative genitourinary   Musculoskeletal negative musculoskeletal ROS (+)   Abdominal   Peds negative pediatric ROS (+)  Hematology negative hematology ROS (+)   Anesthesia Other Findings   Reproductive/Obstetrics negative OB ROS                             Anesthesia Physical Anesthesia Plan  ASA: 3  Anesthesia Plan: General   Post-op Pain Management:    Induction: Intravenous  PONV Risk Score and Plan: 1 and Ondansetron, Dexamethasone and Treatment may vary due to age or medical condition  Airway Management Planned: LMA and Oral ETT  Additional Equipment:   Intra-op Plan:   Post-operative Plan: Extubation in OR  Informed Consent: I have reviewed the patients History and Physical, chart, labs and discussed the procedure including the risks, benefits and alternatives for the proposed anesthesia with the patient or authorized representative who has indicated his/her understanding and acceptance.     Dental advisory given  Plan Discussed with: CRNA and Surgeon  Anesthesia Plan Comments:         Anesthesia Quick Evaluation

## 2021-10-15 NOTE — Progress Notes (Signed)
  ENT has reviewed most recent CT = recommend I&D.  I will discontinue the IR order for aspiration.  Rockell Faulks S Ellwood Steidle PA-C 10/15/2021 2:04 PM

## 2021-10-15 NOTE — Consult Note (Signed)
ENT CONSULT:  Reason for Consult: Right neck abscess Referring Physician: Hospital service  [name] is an 30 y.o. male.   HPI: Patient admitted to the hospital service with acute swelling involving the right neck.  He has a history of neck abscess from previous intravenous drug use.  Patient denies any active drug use or recent trauma to that area.  He reported 1 week history of increasing pain and swelling in the lower neck.  Admitted to the medical service on 10/13/2021, CT scan at that time showed soft tissue inflammation.  Patient has progressing symptoms despite medical therapy with elevated white blood cell count and increasing pain.  Repeat CT scan showed consolidated abscess involving the left deep neck adjacent to the internal jugular vein which appeared to be thrombosed.  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 He has never used smokeless tobacco. He reports that he does not currently use drugs after having used the following drugs: Marijuana. He reports that he does not drink alcohol.  Allergies:  Allergies  Allergen Reactions   Amoxicillin Other (See Comments)    Steven-Johnson's Syndrome   Ceclor [Cefaclor] Other (See Comments)    Steven-Johnson's Syndrome   Erythromycin Other (See Comments)     Steven-Johnson's Syndrome   Penicillins Other (See Comments)    Steven-Johnson's Syndrome   Septra [Sulfamethoxazole-Trimethoprim] Other (See Comments)    Steven-Johnson's Syndrome   Tylenol [Acetaminophen] Other (See Comments)    Unknown reaction - doctor told pt not to take    Medications: I have reviewed the patient's current medications.  Results for orders placed or performed during the hospital encounter of 10/11/21 (from the past 48 hour(s))  CBC     Status: Abnormal   Collection Time: 10/14/21  1:34 AM  Result Value Ref Range   WBC 10.1 4.0 - 10.5 K/uL   RBC 4.22 4.22 - 5.81 MIL/uL   Hemoglobin 12.0 (L) 13.0 - 17.0 g/dL   HCT 36.4 (L) 39.0 - 52.0 %   MCV 86.3 80.0 - 100.0 fL   MCH 28.4 26.0 - 34.0 pg   MCHC 33.0 30.0 - 36.0 g/dL   RDW 12.2 11.5 - 15.5 %   Platelets 262 150 - 400 K/uL   nRBC 0.0 0.0 - 0.2 %    Comment: Performed at [facility], [place] [address], [place], [place] [address]  Vancomycin, peak     Status: None   Collection Time: 10/14/21  1:10 PM  Result Value Ref Range   Vancomycin Pk 38 30 - 40 ug/mL    Comment: Performed at [facility], [place] [address] [place], [place] [address]  Vancomycin,  trough     Status: None   Collection Time: 10/14/21  8:00 PM  Result Value Ref Range   Vancomycin Tr 15 15 - 20 ug/mL    Comment: Performed at [facility] [address] [place], [place] [address]  CBC     Status: Abnormal   Collection Time: 10/15/21  1:05 AM  Result Value Ref Range   WBC 6.8 4.0 - 10.5 K/uL   RBC 4.16 (L) 4.22 - 5.81 MIL/uL   Hemoglobin 11.8 (L) 13.0 - 17.0 g/dL   HCT 35.9 (L) 39.0 - 52.0 %   MCV 86.3 80.0 - 100.0 fL   MCH 28.4 26.0 - 34.0 pg   MCHC 32.9 30.0 - 36.0 g/dL   RDW 12.3 11.5 - 15.5 %   Platelets 219 150 - 400 K/uL   nRBC 0.0 0.0 - 0.2 %    Comment: Performed at [facility], [place] [address], [place], [place] [address]  Basic metabolic panel     Status: Abnormal   Collection Time: 10/15/21  1:05 AM   Result Value Ref Range   Sodium 134 (L) 135 - 145 mmol/L   Potassium 3.1 (L) 3.5 - 5.1 mmol/L    Comment: NO VISIBLE HEMOLYSIS   Chloride 104 98 - 111 mmol/L   CO2 21 (L) 22 - 32 mmol/L   Glucose, Bld 111 (H) 70 - 99 mg/dL    Comment: Glucose reference range applies only to samples taken after fasting for at least 8 hours.   BUN 12 6 - 20 mg/dL   Creatinine, Ser 0.78 0.61 - 1.24 mg/dL   Calcium 8.1 (L) 8.9 - 10.3 mg/dL   GFR, Estimated >60 >60 mL/min    Comment: (NOTE) Calculated using the CKD-EPI Creatinine Equation (2021)    Anion gap 9 5 - 15    Comment: Performed at [facility] [address], [place], [place] [address]    CT SOFT TISSUE NECK W CONTRAST  Result Date: 10/15/2021 CLINICAL DATA:  Right-sided neck pain.  Possible abscess. EXAM: CT NECK WITH CONTRAST TECHNIQUE: Multidetector CT imaging of the neck was performed using the standard protocol following the bolus administration of intravenous contrast. CONTRAST:  50mL OMNIPAQUE IOHEXOL 300 MG/ML  SOLN COMPARISON:  None. FINDINGS: PHARYNX AND LARYNX: The nasopharynx, oropharynx and larynx are normal. Visible portions of the oral cavity, tongue base and floor of mouth are normal. Normal epiglottis, vallecula and pyriform sinuses. The larynx is normal. No retropharyngeal abscess, effusion or lymphadenopathy. SALIVARY GLANDS: Normal parotid, submandibular and sublingual glands. THYROID: Normal. LYMPH NODES: No enlarged or abnormal density lymph nodes. VASCULAR: Thrombosed internal jugular vein with increased size of associated abscess that now measures 2.3 x 2.6 cm. There are again noted 3 retained hypodermic needles in the nearby soft tissues. LIMITED INTRACRANIAL: Normal. VISUALIZED ORBITS: Normal. MASTOIDS AND VISUALIZED PARANASAL SINUSES: No fluid levels or advanced mucosal thickening. No mastoid effusion. SKELETON: No bony spinal canal stenosis. No lytic or blastic lesions. UPPER CHEST: Clear. OTHER: None. IMPRESSION: 1.  Thrombosed right internal jugular vein with increased size of associated abscess that now measures 2.3 x 2.6 cm. 2. Unchanged appearance of 3 retained hypodermic needles in the nearby soft tissues. Electronically Signed   By: Ulyses Jarred M.D.   On: 10/15/2021 00:01   ECHO TEE  Result Date: 10/15/2021    TRANSESOPHOGEAL ECHO REPORT   Patient Name:   [name] Date of Exam: 10/15/2021 Medical Rec #:  [id number]  Height:       66.0 in Accession #:    [id number]     Weight:       170.0 lb Date of Birth:  1991-06-16      BSA:          1.866 m Patient Age:    29 years       BP:           105/60 mmHg Patient Gender: M              HR:           83 bpm. Exam Location:  Inpatient Procedure: Transesophageal Echo, Cardiac Doppler and Color Doppler Indications:     Endocarditis  History:         Patient has prior history of Echocardiogram examinations, most                  recent 10/12/2021.  Sonographer:     Bernadene Person RDCS Referring Phys:  [id number] Tami Lin [facility] Diagnosing Phys: Skeet Latch MD PROCEDURE: After discussion of the risks and benefits of a TEE, an informed consent was obtained from the patient. The transesophogeal probe was passed without difficulty through the esophogus of the patient. Local oropharyngeal anesthetic was provided with Cetacaine. Sedation performed by different physician. The patient was monitored while under deep sedation. Anesthestetic sedation was provided intravenously by Anesthesiology: 362.37mg  of Propofol. The patient's vital signs; including heart rate, blood pressure, and oxygen saturation; remained stable throughout the procedure. The patient developed no complications during the procedure. IMPRESSIONS  1. Cannot rule out tricuspid valve annular abscess. Recommend gated cardiac CT-A or MRI to better assess.  2. Left ventricular ejection fraction, by estimation, is 55 to 60%. The left ventricle has normal function. The left ventricle has no regional wall motion  abnormalities.  3. Right ventricular systolic function is normal. The right ventricular size is normal.  4. No left atrial/left atrial appendage thrombus was detected.  5. The mitral valve is normal in structure. No evidence of mitral valve regurgitation. No evidence of mitral stenosis.  6. The tricuspid valve leaflets are normal. Howerer, the annulus appears thickened, raising concern for annular abscess. There is also a contiguous 1.05 x 0.7 cm echodensity on the ventricular side concerning for vegetation.  7. The aortic valve is tricuspid. Aortic valve regurgitation is not visualized. No aortic stenosis is present.  8. The inferior vena cava is normal in size with greater than 50% respiratory variability, suggesting right atrial pressure of 3 mmHg. Conclusion(s)/Recommendation(s): Normal biventricular function without evidence of hemodynamically significant valvular heart disease. FINDINGS  Left Ventricle: Left ventricular ejection fraction, by estimation, is 55 to 60%. The left ventricle has normal function. The left ventricle has no regional wall motion abnormalities. The left ventricular internal cavity size was normal in size. There is  no left ventricular hypertrophy. Right Ventricle: The right ventricular size is normal. No increase in right ventricular wall thickness. Right ventricular systolic function is normal. Left Atrium: Left atrial size was normal in size. No left atrial/left atrial appendage thrombus was detected. Right Atrium: Right atrial size was normal in size. Pericardium: There is no evidence of pericardial effusion. Mitral Valve: The mitral valve is normal in structure. No evidence of mitral valve regurgitation. No evidence of mitral valve stenosis. Tricuspid Valve: The tricuspid valve leaflets are normal. Howerer, the annulus appears thickened, raising concern for annular abscess. There is also a contiguous 1.05 x 0.7 cm echodensity on the ventricular  side concerning for vegetation. The  tricuspid valve is normal in structure. Tricuspid valve regurgitation is trivial. No evidence of tricuspid stenosis. Aortic Valve: The aortic valve is tricuspid. Aortic valve regurgitation is not visualized. No aortic stenosis is present. Pulmonic Valve: The pulmonic valve was normal in structure. Pulmonic valve regurgitation is not visualized. No evidence of pulmonic stenosis. Aorta: The aortic root is normal in size and structure. Venous: The inferior vena cava is normal in size with greater than 50% respiratory variability, suggesting right atrial pressure of 3 mmHg. IAS/Shunts: No atrial level shunt detected by color flow Doppler. Skeet Latch MD Electronically signed by Skeet Latch MD Signature Date/Time: 10/15/2021/3:22:37 PM    Final     ROS:ROS 12 systems reviewed and negative except as stated in HPI    Blood pressure 100/61, pulse 93, temperature 97.8 F (36.6 C), temperature source Oral, resp. rate 20, height 5\' 6"  (1.676 m), weight 77.1 kg, SpO2 100 %.  PHYSICAL EXAM: General appearance - alert, well appearing, and in no distress Neck -right neck swelling with erythema, tender to palpation.  No palpable adenopathy.  Studies Reviewed: CT scan of the neck from 10/14/2021 is reviewed as above.  Patient has increasing soft tissue mass in the right inferior neck consistent with history of abscess.  Thrombosed right internal jugular vein.  Assessment/Plan: Patient admitted to the medical service for treatment of right neck infection.  Patient's had progression in symptoms with increased pain and fever despite appropriate medical therapy.  Follow-up CT scan from 11/16 shows consolidation of right neck abscess.  Recommend incision and drainage of the abscess under general anesthesia with culture.  Plan continued intravenous antibiotic therapy culture adjusted.  Risk and benefits of procedure discussed with the patient and surgery scheduled at [facility] in OR.  Jerrell Belfast 10/15/2021,  5:03 PM

## 2021-10-15 NOTE — Op Note (Signed)
Operative Note: INCISION & DRAINAGE NECK ABSCESS  Patient: [name] record number: [id number]  Date:10/15/2021  Pre-operative Indications: Right neck abscess  Postoperative Indications: Same  Surgical Procedure: Incision and Drainage of right deep neck abscess  Anesthesia: GET  Surgeon: Delsa Bern, M.D.  Assist: None  Complications: None  EBL: Minimal   Brief History: The patient is a 30 y.o. male with a history of swelling and pain involving the right neck.  The patient was evaluated in the emergency department at [facility] and found to have significant soft tissue inflammatory changes and developing abscess in the right deep neck.  He also had a thrombosed right jugular vein.  The patient has a history of self injection of intravenous, intrajugular drug use.  He was treated over 2 years ago with a similar abscess in the left neck.  Patient denies current drug use. Given the patient's history and findings, I recommended incision and drainage of abscess under general anesthesia, risks and benefits were discussed in detail with the patient and their family. They understand and agree with our plan for surgery which is scheduled at [facility] in OR on an emergency basis.  Surgical Procedure: The patient is brought to the operating room on 10/15/2021 and placed in supine position on the operating table. General endotracheal anesthesia was established without difficulty. When the patient was adequately anesthetized, surgical timeout was performed and correct identification of the patient and the surgical procedure. The patient was positioned and prepped and draped in sterile fashion.  Patient's preoperative imaging was reviewed.  The patient was injected with 1 cc of 1% lidocaine 1-100,000 dilution epinephrine in the skin and soft tissue overlying the palpable abscess.  After allowing adequate time for vasoconstriction and hemostasis #15 scalpel was used to make an  incision in the skin and underlying deep subcutaneous tissue.  This is carried through the soft tissue into the abscess cavity.  A significant amount of purulent material was expressed.  Cultures and sensitivity for aerobic and anaerobic bacteria were obtained and sent to the laboratory.  The abscess cavity was then thoroughly irrigated with sterile saline solution.  A Penrose drain was inserted to the depth of the abscess cavity and sutured to the skin with interrupted 3-0 Ethilon suture.  The patient's wound was then dressed with Telfa gauze, 4 x 4 gauze and a 4 inch Kerlix wrap.  An orogastric tube was passed and stomach contents were aspirated. Patient was awakened from anesthetic and transferred from the operating room to the recovery room in stable condition. There were no complications and blood loss was minimal.   Delsa Bern, M.D. [facility] 10/15/2021

## 2021-10-16 ENCOUNTER — Encounter (HOSPITAL_COMMUNITY): Payer: Self-pay | Admitting: Otolaryngology

## 2021-10-16 ENCOUNTER — Inpatient Hospital Stay (HOSPITAL_COMMUNITY): Payer: Medicaid Other

## 2021-10-16 DIAGNOSIS — R9430 Abnormal result of cardiovascular function study, unspecified: Secondary | ICD-10-CM

## 2021-10-16 DIAGNOSIS — M545 Low back pain, unspecified: Secondary | ICD-10-CM | POA: Diagnosis not present

## 2021-10-16 DIAGNOSIS — G40909 Epilepsy, unspecified, not intractable, without status epilepticus: Secondary | ICD-10-CM | POA: Diagnosis not present

## 2021-10-16 DIAGNOSIS — I808 Phlebitis and thrombophlebitis of other sites: Secondary | ICD-10-CM | POA: Diagnosis not present

## 2021-10-16 DIAGNOSIS — L0211 Cutaneous abscess of neck: Secondary | ICD-10-CM | POA: Diagnosis not present

## 2021-10-16 LAB — BASIC METABOLIC PANEL
Anion gap: 7 (ref 5–15)
BUN: 8 mg/dL (ref 6–20)
CO2: 21 mmol/L — ABNORMAL LOW (ref 22–32)
Calcium: 8.9 mg/dL (ref 8.9–10.3)
Chloride: 106 mmol/L (ref 98–111)
Creatinine, Ser: 0.66 mg/dL (ref 0.61–1.24)
GFR, Estimated: >60 mL/min (ref >60)
Glucose, Bld: 173 mg/dL — ABNORMAL HIGH (ref 70–99)
Potassium: 4.2 mmol/L (ref 3.5–5.1)
Sodium: 134 mmol/L — ABNORMAL LOW (ref 135–145)

## 2021-10-16 LAB — CULTURE, BLOOD (ROUTINE X 2)
Culture: NO GROWTH
Special Requests: ADEQUATE

## 2021-10-16 LAB — CBC
HCT: 34 % — ABNORMAL LOW (ref 39.0–52.0)
Hemoglobin: 11.6 g/dL — ABNORMAL LOW (ref 13.0–17.0)
MCH: 28.4 pg (ref 26.0–34.0)
MCHC: 34.1 g/dL (ref 30.0–36.0)
MCV: 83.3 fL (ref 80.0–100.0)
Platelets: 297 10*3/uL (ref 150–400)
RBC: 4.08 MIL/uL — ABNORMAL LOW (ref 4.22–5.81)
RDW: 12 % (ref 11.5–15.5)
WBC: 8.4 10*3/uL (ref 4.0–10.5)
nRBC: 0 % (ref 0.0–0.2)

## 2021-10-16 MED ORDER — NICOTINE 21 MG/24HR TD PT24
21.0000 mg | MEDICATED_PATCH | Freq: Every day | TRANSDERMAL | Status: DC
  Administered 2021-10-16 – 2021-10-19 (×4): 21 mg via TRANSDERMAL
  Filled 2021-10-16 (×4): qty 1

## 2021-10-16 MED ORDER — IOHEXOL 350 MG/ML SOLN
100.0000 mL | Freq: Once | INTRAVENOUS | Status: AC | PRN
  Administered 2021-10-16: 100 mL via INTRAVENOUS

## 2021-10-16 MED ORDER — METOPROLOL TARTRATE 5 MG/5ML IV SOLN
INTRAVENOUS | Status: AC
  Administered 2021-10-16: 5 mg
  Filled 2021-10-16: qty 10

## 2021-10-16 NOTE — Progress Notes (Signed)
ENT Progress Note: POD #1 s/p Procedure(s): INCISION AND DRAINAGE ABSCESS   Subjective: Improvement in pain  Objective: Vital signs in last 24 hours: Temp:  [97.6 F (36.4 C)-98.6 F (37 C)] 97.6 F (36.4 C) (11/18 0749) Pulse Rate:  [78-97] 80 (11/18 0409) Resp:  [10-20] 10 (11/18 0749) BP: (87-107)/(47-79) 105/75 (11/18 0749) SpO2:  [96 %-100 %] 99 % (11/18 0749) Weight:  [77.1 kg-80.3 kg] 80.3 kg (11/18 0409) Weight change:  Last BM Date: 10/15/21  Intake/Output from previous day: 11/17 0701 - 11/18 0700 In: 1324.8 [P.O.:120; I.V.:400; IV Piggyback:804.8] Out: 5 [Blood:5] Intake/Output this shift: No intake/output data recorded.  Labs: Recent Labs    10/15/21 0105 10/16/21 0212  WBC 6.8 8.4  HGB 11.8* 11.6*  HCT 35.9* 34.0*  PLT 219 297   Recent Labs    10/15/21 0105 10/16/21 0212  NA 134* 134*  K 3.1* 4.2  CL 104 106  CO2 21* 21*  GLUCOSE 111* 173*  BUN 12 8  CALCIUM 8.1* 8.9    Studies/Results: CT SOFT TISSUE NECK W CONTRAST  Result Date: 10/15/2021 CLINICAL DATA:  Right-sided neck pain.  Possible abscess. EXAM: CT NECK WITH CONTRAST TECHNIQUE: Multidetector CT imaging of the neck was performed using the standard protocol following the bolus administration of intravenous contrast. CONTRAST:  54mL OMNIPAQUE IOHEXOL 300 MG/ML  SOLN COMPARISON:  None. FINDINGS: PHARYNX AND LARYNX: The nasopharynx, oropharynx and larynx are normal. Visible portions of the oral cavity, tongue base and floor of mouth are normal. Normal epiglottis, vallecula and pyriform sinuses. The larynx is normal. No retropharyngeal abscess, effusion or lymphadenopathy. SALIVARY GLANDS: Normal parotid, submandibular and sublingual glands. THYROID: Normal. LYMPH NODES: No enlarged or abnormal density lymph nodes. VASCULAR: Thrombosed internal jugular vein with increased size of associated abscess that now measures 2.3 x 2.6 cm. There are again noted 3 retained hypodermic needles in the  nearby soft tissues. LIMITED INTRACRANIAL: Normal. VISUALIZED ORBITS: Normal. MASTOIDS AND VISUALIZED PARANASAL SINUSES: No fluid levels or advanced mucosal thickening. No mastoid effusion. SKELETON: No bony spinal canal stenosis. No lytic or blastic lesions. UPPER CHEST: Clear. OTHER: None. IMPRESSION: 1. Thrombosed right internal jugular vein with increased size of associated abscess that now measures 2.3 x 2.6 cm. 2. Unchanged appearance of 3 retained hypodermic needles in the nearby soft tissues. Electronically Signed   By: Ulyses Jarred M.D.   On: 10/15/2021 00:01   ECHO TEE  Result Date: 10/15/2021    TRANSESOPHOGEAL ECHO REPORT   Patient Name:   JONATHA GAGEN Date of Exam: 10/15/2021 Medical Rec #:  [id number]      Height:       66.0 in Accession #:    [id number]     Weight:       170.0 lb Date of Birth:  03/14/1991      BSA:          1.866 m Patient Age:    30 years       BP:           105/60 mmHg Patient Gender: M              HR:           83 bpm. Exam Location:  Inpatient Procedure: Transesophageal Echo, Cardiac Doppler and Color Doppler Indications:     Endocarditis  History:         Patient has prior history of Echocardiogram examinations, most  recent 10/12/2021.  Sonographer:     Bernadene Person RDCS Referring Phys:  [id number] Tami Lin [facility] Diagnosing Phys: Skeet Latch MD PROCEDURE: After discussion of the risks and benefits of a TEE, an informed consent was obtained from the patient. The transesophogeal probe was passed without difficulty through the esophogus of the patient. Local oropharyngeal anesthetic was provided with Cetacaine. Sedation performed by different physician. The patient was monitored while under deep sedation. Anesthestetic sedation was provided intravenously by Anesthesiology: 362.37mg  of Propofol. The patient's vital signs; including heart rate, blood pressure, and oxygen saturation; remained stable throughout the procedure. The patient developed no  complications during the procedure. IMPRESSIONS  1. Cannot rule out tricuspid valve annular abscess. Recommend gated cardiac CT-A or MRI to better assess.  2. Left ventricular ejection fraction, by estimation, is 55 to 60%. The left ventricle has normal function. The left ventricle has no regional wall motion abnormalities.  3. Right ventricular systolic function is normal. The right ventricular size is normal.  4. No left atrial/left atrial appendage thrombus was detected.  5. The mitral valve is normal in structure. No evidence of mitral valve regurgitation. No evidence of mitral stenosis.  6. The tricuspid valve leaflets are normal. Howerer, the annulus appears thickened, raising concern for annular abscess. There is also a contiguous 1.05 x 0.7 cm echodensity on the ventricular side concerning for vegetation.  7. The aortic valve is tricuspid. Aortic valve regurgitation is not visualized. No aortic stenosis is present.  8. The inferior vena cava is normal in size with greater than 50% respiratory variability, suggesting right atrial pressure of 3 mmHg. Conclusion(s)/Recommendation(s): Normal biventricular function without evidence of hemodynamically significant valvular heart disease. FINDINGS  Left Ventricle: Left ventricular ejection fraction, by estimation, is 55 to 60%. The left ventricle has normal function. The left ventricle has no regional wall motion abnormalities. The left ventricular internal cavity size was normal in size. There is  no left ventricular hypertrophy. Right Ventricle: The right ventricular size is normal. No increase in right ventricular wall thickness. Right ventricular systolic function is normal. Left Atrium: Left atrial size was normal in size. No left atrial/left atrial appendage thrombus was detected. Right Atrium: Right atrial size was normal in size. Pericardium: There is no evidence of pericardial effusion. Mitral Valve: The mitral valve is normal in structure. No evidence of  mitral valve regurgitation. No evidence of mitral valve stenosis. Tricuspid Valve: The tricuspid valve leaflets are normal. Howerer, the annulus appears thickened, raising concern for annular abscess. There is also a contiguous 1.05 x 0.7 cm echodensity on the ventricular side concerning for vegetation. The tricuspid valve is normal in structure. Tricuspid valve regurgitation is trivial. No evidence of tricuspid stenosis. Aortic Valve: The aortic valve is tricuspid. Aortic valve regurgitation is not visualized. No aortic stenosis is present. Pulmonic Valve: The pulmonic valve was normal in structure. Pulmonic valve regurgitation is not visualized. No evidence of pulmonic stenosis. Aorta: The aortic root is normal in size and structure. Venous: The inferior vena cava is normal in size with greater than 50% respiratory variability, suggesting right atrial pressure of 3 mmHg. IAS/Shunts: No atrial level shunt detected by color flow Doppler. Skeet Latch MD Electronically signed by Skeet Latch MD Signature Date/Time: 10/15/2021/3:22:37 PM    Final      PHYSICAL EXAM: Incision and drain intact, minimal discharge this morning. Airway stable, patient tolerating normal oral intake.   Assessment/Plan: Patient reports clinical improvement with decreased pain after incision and drainage of right neck  abscess.  Minimal mucopurulent bloody drainage at this time.  Recommend continued twice daily dressing changes.  We will follow.  Continue current antibiotic therapy and treatment per infectious disease service, culture and sensitivity pending from abscess drainage on 10/15/2021.  Anticipate drain removal early next week if continued clinical improvement.     Jerrell Belfast 10/16/2021, 11:09 AM

## 2021-10-16 NOTE — Progress Notes (Signed)
PROGRESS NOTE    [name]  VWU:[id number] DOB: 03-Apr-1991 DOA: 10/11/2021 PCP: Pcp, No    Brief Narrative:  [name] was admitted to the hospital with the working diagnosis of neck abscess, right lower neck, complicated with right internal jugular vein thrombophlebitis.    30 year old male past medical history for IV drug abuse, seizure disorder, opioid addiction and chronic headaches.  He presented painful neck edema on the right side.  Reported 3-4 days of edema on the right side of his neck, that progressed into his face.  Denied any dyspnea or dysphagia, but positive fever.  On his initial physical examination his blood pressure was 109/77, heart rate 87, respirate 16, oxygen saturation 98%, his lungs are clear to auscultation bilaterally, heart S1-S2, present, rhythmic, soft abdomen, lower extremity edema.  Positive right-sided neck edema.   Sodium 137, potassium 3.9, chloride 97, bicarb 30, glucose 103, BUN 10, creatinine 0.62, white count 9.5, hemoglobin 12.3, hematocrit 37.4, platelets 309. SARS COVID-19 negative.   Soft tissue CT neck with right internal jugular vein thrombophlebitis with extensive surrounding cellulitis/phlegmon and abscess in the right lower neck at the level of thyroid.  Surrounding spread of infection with retropharyngeal and hypopharyngeal edema and overlying sternocleidomastoid myositis.  Multiple chronic linear foraging bodies in the right lower neck possibly retained needles.  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   EKG 99 bpm, normal axis, normal intervals, sinus rhythm, no significant ST segment T wave changes.   Patient was placed on broad-spectrum antibiotic therapy.   11/13 transferred from [facility] to [facility] for ENT and vascular surgery evaluation.   Recommendations for conservative medical management, including anticoagulation.   11/16 Patient with no significant improvement in neck abscess, follow up neck CT soft tissue with increased  sized abscess associated with right thrombosed internal jugular vein, measuring 2.3 x 2,6 cm. 3 retained hypodermic needles in the nearby soft tissues.    11/17 Dr Annalee Genta from ENT performed I&D of deep neck abscess.  11/17 TEE with tricuspid valve with echodensity concerning for vegetation, thickened annulus concerning for abscess.    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  Patient sp I&D of right neck abscess with significant improvement in pain, no dyspnea or dysphagia. He has been afebrile and wbc is  8,4. Preliminary culture from abscess with gram positive cocci.   Further work up with chest CT to rule out paravalvular abscess on the tricuspid valve.   Continue antibiotic therapy with vancomycin, levofloxacin and metronidazole. Analgesia with oxycodone and hydromorphone as needed. Anticoagulation with enoxaparin therapeutic doses.   Ok to discontinue telemetry. Out of bed to chair tid with meals   2. Seizure disorder.   Continue with keppra and topiramate no active seizures.  Out of bed to chair tid with meals.    3. Chronic pain syndrome and substance abuse disorder/ tobacco abuse.  home patient with buprenorphine.  Continue with cyclobenzaprine as needed with good toleration.   Add nicotine patch today.    4, Hypokalemia/ hyponatremia.  Patient is tolerating po well, Na  is 134, K is up to 4,2 and serum bicarbonate is 21. Renal function stable with serum cr at 0,66.     Patient continue to be at high risk for worsening  infection   Status is: Inpatient  Remains inpatient appropriate because: IV antibiotic therapy   DVT prophylaxis: Enoxaparin   Code Status:    full  Family Communication:   No family at the bedside    Consultants:  [facility] ENT Cardiology for TEE   Procedures:  Neck abscess I&D   Antimicrobials:  Vancomycin, levofloxacin and metronidazole     Subjective: Patient with no nausea or vomiting, his neck pain has improved, no chest pain or dyspnea.   Objective: Vitals:   10/15/21 1959 10/15/21 2337 10/16/21 0409 10/16/21 0749  BP: (!) 99/47 100/64 94/61 105/75  Pulse: 80 85 80   Resp: 17 14 16 10   Temp: 98.6 F (37 C) 98.6 F (37 C) 97.8 F (36.6 C) 97.6 F (36.4 C)  TempSrc: Oral Oral Oral Oral  SpO2: 100% 100% 100% 99%  Weight:   80.3 kg   Height:        Intake/Output Summary (Last 24 hours) at 10/16/2021 0930 Last data filed at 10/16/2021 0236 Gross per 24 hour  Intake 1324.83 ml  Output 5 ml  Net 1319.83 ml   Filed Weights   10/11/21 1231 10/15/21 1242 10/16/21 0409  Weight: 77.1 kg 77.1 kg 80.3 kg    Examination:   General: Not in pain or dyspnea  Neurology: Awake and alert, non focal  E ENT: no pallor, no icterus, oral mucosa moist, dressing in place.  Cardiovascular: No JVD. S1-S2 present, rhythmic, no gallops, rubs, or murmurs. No lower extremity edema. Pulmonary: positive breath sounds bilaterally, adequate air movement, no wheezing, rhonchi or rales. Gastrointestinal. Abdomen soft and non tender Skin. No rashes Musculoskeletal: no joint deformities     Data Reviewed: I have personally reviewed following labs and imaging studies  CBC: Recent Labs  Lab 10/11/21 1330 10/12/21 0757 10/13/21 0440 10/14/21 0134 10/15/21 0105 10/16/21 0212  WBC 9.5 7.3 15.9* 10.1 6.8 8.4  NEUTROABS 7.3  --   --   --   --   --   HGB 12.3* 11.9* 12.2* 12.0* 11.8* 11.6*  HCT 37.4* 35.5* 35.8* 36.4* 35.9* 34.0*  MCV 86.2 84.9 83.8 86.3 86.3 83.3  PLT 309 319 333  262 219 297   Basic Metabolic Panel: Recent Labs  Lab 10/11/21 1330 10/13/21 0139 10/15/21 0105 10/16/21 0212  NA 137 135 134* 134*  K 3.9 3.8 3.1* 4.2  CL 97* 107 104 106  CO2 30 19* 21* 21*  GLUCOSE 103* 208* 111* 173*  BUN 10 18 12 8   CREATININE 0.62 0.86 0.78 0.66  CALCIUM 9.2 9.0 8.1* 8.9   GFR: Estimated Creatinine Clearance: 133.2 mL/min (by C-G formula based on SCr of 0.66 mg/dL). Liver Function Tests: Recent Labs  Lab 10/11/21 1330  AST 12*  ALT 9  ALKPHOS 68  BILITOT 0.4  PROT 7.8  ALBUMIN 4.1   Recent Labs  Lab 10/11/21 1330  LIPASE 28   No results for input(s): AMMONIA in the last 168 hours. Coagulation Profile: No results for input(s): INR, PROTIME in the last 168 hours. Cardiac Enzymes: No results for input(s): CKTOTAL, CKMB, CKMBINDEX, TROPONINI in the last 168 hours. BNP (last 3 results) No results for input(s): PROBNP in the last 8760 hours. HbA1C: No results for input(s): HGBA1C in the last 72 hours. CBG: No results  for input(s): GLUCAP in the last 168 hours. Lipid Profile: No results for input(s): CHOL, HDL, LDLCALC, TRIG, CHOLHDL, LDLDIRECT in the last 72 hours. Thyroid Function Tests: No results for input(s): TSH, T4TOTAL, FREET4, T3FREE, THYROIDAB in the last 72 hours. Anemia Panel: No results for input(s): VITAMINB12, FOLATE, FERRITIN, TIBC, IRON, RETICCTPCT in the last 72 hours.    Radiology Studies: I have reviewed all of the imaging during this hospital visit personally     Scheduled Meds:  acetaminophen  650 mg Oral Q6H   enoxaparin (LOVENOX) injection  120 mg Subcutaneous Q24H   levETIRAcetam  750 mg Oral BID   levofloxacin  750 mg Oral q1800   metroNIDAZOLE  500 mg Oral BID   polyethylene glycol  17 g Oral BID   senna  1 tablet Oral QHS   topiramate  100 mg Oral BID   Continuous Infusions:  vancomycin 1,000 mg (10/15/21 2131)     LOS: 5 days        Dalaya Suppa Annett Gula, MD

## 2021-10-16 NOTE — Progress Notes (Signed)
[facility] Infectious Diseases Follow Up Note  Patient Identification: Patient Name: [name] MRN: [id number] [place] Date: 10/11/2021 12:27 PM Age: 30 y.o.Today's Date: 10/16/2021   Reason for Visit:Neck abscess/possible endocarditis  Principal Problem:   Neck abscess Active Problems:   Thrombophlebitis of internal jugular vein   Seizure disorder ([facility])   Foreign body of neck   Opioid use disorder, moderate, in sustained remission ([facility])   Back pain   Tricuspid valve vegetation  Antibiotics: Vancomycin 11/13                    Levofloxacin 11/13                    Metronidazole 11/13   Lines/Hardware:    Interval Events: remains afebrile, WBC 8.  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 Cannot rule out tricuspid valve annular abscess.   # History of Stevens-Johnson syndrome with penicillin # History of left IJ thrombosis with neck abscess August 2020( MRSA)-completed 2.5 weeks of treatment(IV and  p.o. antibiotics).  Stopped taking anticoagulant approximately a year ago # IVDU - HIV negative, heB serology with inconsistent immunity  # Hepatitis C - HCV RNA negative  # Opioid use disorder on Suboxone # Tobacco use   Recommendations Continue vancomycin, pharmacy to dose, levofloxacin and metronidazole. GPC in gram stain is most likely MRSA related  as he had left sided neck abscess with MRSA in August 2020. Will fu final cultures as well as Gated CTA from today before planning to  de-escalate abtx TEE canot r/o TV annulus abscess. Discussed with cardiology Dr Oval Linsey, agrees to getting a Gated CTA to r/o TV annulus abscess. May need CT surgery input following CTA. Follow-up blood cultures and OR cx ( GPC in gram stain) Monitor CBC, BMP and Vancomycin trough  Anticoagulation per Primary and Vascular Following   Dr Laurice Record covering this weekend and will follow up on cultures/CTA. Otherwise, new ID team will follow on Monday   Rest of the management as per the primary team. Thank you for the consult. Please page with pertinent questions or concerns.  ______________________________________________________________________ Subjective patient seen and examined at the bedside. He feels better after I and D of his rt neck abscess. Pain is 7/10. Ate post op last night without issues.   Vitals BP 105/75 (BP Location: Left Arm)   Pulse 80   Temp 97.6 F (36.4 C) (Oral)   Resp 10   Ht 5\' 6"  (1.676 m)   Wt 80.3 kg   SpO2 99%   BMI 28.57 kg/m     Physical Exam Constitutional:  sleeping in bed comfortably.      Comments: neck is wrapped in a bandage  Cardiovascular:     Rate and Rhythm: Normal rate and regular rhythm.     Heart sounds:   Pulmonary:     Effort: Pulmonary effort is normal.     Comments:   Abdominal:     Palpations: Abdomen is soft.     Tenderness:   Musculoskeletal:  General: No swelling or tenderness.   Skin:    Comments: Tattoos+  Neurological:     General: No focal deficit present.   Psychiatric:        Mood and Affect: Mood normal.   Pertinent Microbiology Results for orders placed or performed during the hospital encounter of 10/11/21  Blood culture (routine x 2)     Status: None (Preliminary result)   Collection Time: 10/11/21  2:15 PM   Specimen: BLOOD  Result Value Ref Range Status   Specimen Description   Final    BLOOD RIGHT ANTECUBITAL Performed at [facility] [address],  [place], [place] [address]    Special Requests   Final    BOTTLES DRAWN AEROBIC AND ANAEROBIC Blood Culture adequate volume Performed at [facility] [address] [place], [place] [address]    Culture   Final    NO GROWTH 4 DAYS Performed at [facility], [place] [address], [place], [place] [address]    Report Status PENDING  Incomplete  Resp Panel by RT-PCR (Flu A&B, Covid)     Status: None   Collection Time: 10/11/21  2:26 PM   Specimen: Nasopharyngeal(NP) swabs in vial transport medium  Result Value Ref Range Status   SARS Coronavirus 2 by RT PCR NEGATIVE NEGATIVE Final    Comment: (NOTE) SARS-CoV-2 target nucleic acids are NOT DETECTED.  The SARS-CoV-2 RNA is generally detectable in upper respiratory specimens during the acute phase of infection. The lowest concentration of SARS-CoV-2 viral copies this assay can detect is 138 copies/mL. A negative result does not preclude SARS-Cov-2 infection and should not be used as the sole basis for treatment or other patient management decisions. A negative result may occur with  improper specimen collection/handling, submission of specimen other than nasopharyngeal swab, presence of viral mutation(s) within the areas targeted by this assay, and inadequate number of viral copies(<138 copies/mL). A negative result must be combined with clinical observations, patient history, and epidemiological information. The expected result is Negative.  Fact Sheet for Patients:  EntrepreneurPulse.com.au  Fact Sheet for Healthcare Providers:  IncredibleEmployment.be  This test is no t yet approved or cleared by the [place] FDA and  has been authorized for detection and/or diagnosis of SARS-CoV-2 by FDA under an Emergency Use Authorization (EUA). This EUA will remain  in effect (meaning this test can be used) for the duration of the COVID-19 declaration under Section 564(b)(1) of the Act,  21 U.S.C.section 360bbb-3(b)(1), unless the authorization is terminated  or revoked sooner.       Influenza A by PCR NEGATIVE NEGATIVE Final   Influenza B by PCR NEGATIVE NEGATIVE Final    Comment: (NOTE) The Xpert Xpress SARS-CoV-2/FLU/RSV plus assay is intended as an aid in the diagnosis of influenza from Nasopharyngeal swab specimens and should not be used as a sole basis for treatment. Nasal washings and aspirates are unacceptable for Xpert Xpress SARS-CoV-2/FLU/RSV testing.  Fact Sheet for Patients: EntrepreneurPulse.com.au  Fact Sheet for Healthcare Providers: IncredibleEmployment.be  This test is not yet approved or cleared by the [place] FDA and has been authorized for detection and/or diagnosis of SARS-CoV-2 by FDA under an Emergency Use Authorization (EUA). This EUA will remain in effect (meaning this test can be used) for the duration of the COVID-19 declaration under Section 564(b)(1) of the Act, 21 U.S.C. section 360bbb-3(b)(1), unless the authorization is terminated or revoked.  Performed at [facility], [place] [address]  [place], [place] [address]   Culture, blood (Routine X 2) w Reflex to ID Panel     Status: None (Preliminary result)   Collection Time: 10/11/21  8:20 PM   Specimen: BLOOD  Result Value Ref Range Status   Specimen Description   Final    BLOOD LEFT ANTECUBITAL Performed at [facility] [address], [place], [place] [address]    Special Requests   Final    BOTTLES DRAWN AEROBIC AND ANAEROBIC Blood Culture adequate volume Performed at [facility] [address] [place], [place] [address]    Culture   Final    NO GROWTH 3 DAYS Performed at [facility], [place] [address] [place], [place] [address]    Report Status PENDING  Incomplete  MRSA Next Gen by PCR, Nasal     Status: None   Collection Time: 10/12/21 12:07 PM   Specimen: Nasal  Mucosa; Nasal Swab  Result Value Ref Range Status   MRSA by PCR Next Gen NOT DETECTED NOT DETECTED Final    Comment: (NOTE) The GeneXpert MRSA Assay (FDA approved for NASAL specimens only), is one component of a comprehensive MRSA colonization surveillance program. It is not intended to diagnose MRSA infection nor to guide or monitor treatment for MRSA infections. Test performance is not FDA approved in patients less than 45 years old. Performed at [facility], [place] [address], [place], [place] [address]   Aerobic/Anaerobic Culture w Gram Stain (surgical/deep wound)     Status: None (Preliminary result)   Collection Time: 10/15/21  6:20 PM   Specimen: PATH Other; ENT  Result Value Ref Range Status   Specimen Description ABSCESS NECK  Final   Special Requests PT ON LEVAQUIN,FLAGYL, VANC  Final   Gram Stain   Final    NO SQUAMOUS EPITHELIAL CELLS SEEN FEW WBC SEEN FEW GRAM POSITIVE COCCI Performed at [facility], [place] [address], [place], [place] [address]    Culture PENDING  Incomplete   Report Status PENDING  Incomplete    Pertinent Lab. CBC Latest Ref Rng & Units 10/16/2021 10/15/2021 10/14/2021  WBC 4.0 - 10.5 K/uL 8.4 6.8 10.1  Hemoglobin 13.0 - 17.0 g/dL 11.6(L) 11.8(L) 12.0(L)  Hematocrit 39.0 - 52.0 % 34.0(L) 35.9(L) 36.4(L)  Platelets 150 - 400 K/uL 297 219 262   CMP Latest Ref Rng & Units 10/16/2021 10/15/2021 10/13/2021  Glucose 70 - 99 mg/dL 173(H) 111(H) 208(H)  BUN 6 - 20 mg/dL 8 12 18   Creatinine 0.61 - 1.24 mg/dL 0.66 0.78 0.86  Sodium 135 - 145 mmol/L 134(L) 134(L) 135  Potassium 3.5 - 5.1 mmol/L 4.2 3.1(L) 3.8  Chloride 98 - 111 mmol/L 106 104 107  CO2 22 - 32 mmol/L 21(L) 21(L) 19(L)  Calcium 8.9 - 10.3 mg/dL 8.9 8.1(L) 9.0  Total Protein 6.5 - 8.1 g/dL - - -  Total Bilirubin 0.3 - 1.2 mg/dL - - -  Alkaline Phos 38 - 126 U/L - - -  AST 15 - 41 U/L - - -  ALT 0 - 44 U/L - - -     Pertinent Imaging today Plain films and CT images have  been personally visualized and interpreted; radiology reports have been reviewed. Decision making incorporated into the Impression / Recommendations.  ECHO TEE  Result Date: 10/15/2021    TRANSESOPHOGEAL ECHO REPORT   Patient Name:   [name] Date of Exam: 10/15/2021 Medical Rec #:  [id number]  Height:       66.0 in Accession #:    [id number]     Weight:       170.0 lb Date of Birth:  August 16, 1991      BSA:          1.866 m Patient Age:    29 years       BP:           105/60 mmHg Patient Gender: M              HR:           83 bpm. Exam Location:  Inpatient Procedure: Transesophageal Echo, Cardiac Doppler and Color Doppler Indications:     Endocarditis  History:         Patient has prior history of Echocardiogram examinations, most                  recent 10/12/2021.  Sonographer:     Bernadene Person RDCS Referring Phys:  [id number] Tami Lin [facility] Diagnosing Phys: Skeet Latch MD PROCEDURE: After discussion of the risks and benefits of a TEE, an informed consent was obtained from the patient. The transesophogeal probe was passed without difficulty through the esophogus of the patient. Local oropharyngeal anesthetic was provided with Cetacaine. Sedation performed by different physician. The patient was monitored while under deep sedation. Anesthestetic sedation was provided intravenously by Anesthesiology: 362.37mg  of Propofol. The patient's vital signs; including heart rate, blood pressure, and oxygen saturation; remained stable throughout the procedure. The patient developed no complications during the procedure. IMPRESSIONS  1. Cannot rule out tricuspid valve annular abscess. Recommend gated cardiac CT-A or MRI to better assess.  2. Left ventricular ejection fraction, by estimation, is 55 to 60%. The left ventricle has normal function. The left ventricle has no regional wall motion abnormalities.  3. Right ventricular systolic function is normal. The right ventricular size is normal.  4. No left  atrial/left atrial appendage thrombus was detected.  5. The mitral valve is normal in structure. No evidence of mitral valve regurgitation. No evidence of mitral stenosis.  6. The tricuspid valve leaflets are normal. Howerer, the annulus appears thickened, raising concern for annular abscess. There is also a contiguous 1.05 x 0.7 cm echodensity on the ventricular side concerning for vegetation.  7. The aortic valve is tricuspid. Aortic valve regurgitation is not visualized. No aortic stenosis is present.  8. The inferior vena cava is normal in size with greater than 50% respiratory variability, suggesting right atrial pressure of 3 mmHg. Conclusion(s)/Recommendation(s): Normal biventricular function without evidence of hemodynamically significant valvular heart disease. FINDINGS  Left Ventricle: Left ventricular ejection fraction, by estimation, is 55 to 60%. The left ventricle has normal function. The left ventricle has no regional wall motion abnormalities. The left ventricular internal cavity size was normal in size. There is  no left ventricular hypertrophy. Right Ventricle: The right ventricular size is normal. No increase in right ventricular wall thickness. Right ventricular systolic function is normal. Left Atrium: Left atrial size was normal in size. No left atrial/left atrial appendage thrombus was detected. Right Atrium: Right atrial size was normal in size. Pericardium: There is no evidence of pericardial effusion. Mitral Valve: The mitral valve is normal in structure. No evidence of mitral valve regurgitation. No evidence of mitral valve stenosis. Tricuspid Valve: The tricuspid valve leaflets are normal. Howerer, the annulus appears thickened, raising concern for annular abscess. There is also a contiguous 1.05 x 0.7 cm echodensity on the ventricular  side concerning for vegetation. The tricuspid valve is normal in structure. Tricuspid valve regurgitation is trivial. No evidence of tricuspid stenosis.  Aortic Valve: The aortic valve is tricuspid. Aortic valve regurgitation is not visualized. No aortic stenosis is present. Pulmonic Valve: The pulmonic valve was normal in structure. Pulmonic valve regurgitation is not visualized. No evidence of pulmonic stenosis. Aorta: The aortic root is normal in size and structure. Venous: The inferior vena cava is normal in size with greater than 50% respiratory variability, suggesting right atrial pressure of 3 mmHg. IAS/Shunts: No atrial level shunt detected by color flow Doppler. Skeet Latch MD Electronically signed by Skeet Latch MD Signature Date/Time: 10/15/2021/3:22:37 PM    Final      I spent more than 40 minutes for this patient encounter including review of prior medical records, coordination of care  with greater than 50% of time being face to face/counseling and discussing diagnostics/treatment plan with the patient/family.  Electronically signed by:   Rosiland Oz, MD Infectious Disease Physician [facility] Infect[facility] Pager: [phone]

## 2021-10-16 NOTE — Plan of Care (Signed)
  Problem: Clinical Measurements: Goal: Will remain free from infection Outcome: Progressing Goal: Diagnostic test results will improve Outcome: Progressing   

## 2021-10-16 NOTE — Progress Notes (Signed)
Pt assisted OOB up to recliner, as per care order instructions.  Minimal assistance.

## 2021-10-16 NOTE — Anesthesia Postprocedure Evaluation (Signed)
Anesthesia Post Note  Patient: [name]  Procedure(s) Performed: INCISION AND DRAINAGE ABSCESS (Right: Neck)     Patient location during evaluation: PACU Anesthesia Type: General Level of consciousness: awake and alert Pain management: pain level controlled Vital Signs Assessment: post-procedure vital signs reviewed and stable Respiratory status: spontaneous breathing, nonlabored ventilation, respiratory function stable and patient connected to nasal cannula oxygen Cardiovascular status: blood pressure returned to baseline and stable Postop Assessment: no apparent nausea or vomiting Anesthetic complications: no   No notable events documented.  Last Vitals:  Vitals:   10/16/21 0409 10/16/21 0749  BP: 94/61 105/75  Pulse: 80   Resp: 16 10  Temp: 36.6 C 36.4 C  SpO2: 100% 99%    Last Pain:  Vitals:   10/16/21 1128  TempSrc:   PainSc: Asleep                 Mystie Ormand S

## 2021-10-16 NOTE — Progress Notes (Addendum)
ID Brief Note    Gated CTA was done per Cardiology recs but did not comment on possible concern for TV annulus abscess.   Recommend to consult CT surgery evaluation for TEE findings. Discussed with Dr Cathlean Sauer    Rosiland Oz, MD Infectious Disease Physician [facility] [address] [place], [place] [address] Phone: [phone]  Fax: [phone]

## 2021-10-17 DIAGNOSIS — M545 Low back pain, unspecified: Secondary | ICD-10-CM | POA: Diagnosis not present

## 2021-10-17 DIAGNOSIS — I808 Phlebitis and thrombophlebitis of other sites: Secondary | ICD-10-CM | POA: Diagnosis not present

## 2021-10-17 DIAGNOSIS — F1121 Opioid dependence, in remission: Secondary | ICD-10-CM | POA: Diagnosis not present

## 2021-10-17 DIAGNOSIS — L0211 Cutaneous abscess of neck: Secondary | ICD-10-CM | POA: Diagnosis not present

## 2021-10-17 LAB — CULTURE, BLOOD (ROUTINE X 2)
Culture: NO GROWTH
Special Requests: ADEQUATE

## 2021-10-17 LAB — CBC
HCT: 33.7 % — ABNORMAL LOW (ref 39.0–52.0)
Hemoglobin: 11.4 g/dL — ABNORMAL LOW (ref 13.0–17.0)
MCH: 28.4 pg (ref 26.0–34.0)
MCHC: 33.8 g/dL (ref 30.0–36.0)
MCV: 84 fL (ref 80.0–100.0)
Platelets: 249 10*3/uL (ref 150–400)
RBC: 4.01 MIL/uL — ABNORMAL LOW (ref 4.22–5.81)
RDW: 12.1 % (ref 11.5–15.5)
WBC: 7.4 10*3/uL (ref 4.0–10.5)
nRBC: 0 % (ref 0.0–0.2)

## 2021-10-17 NOTE — Progress Notes (Signed)
Patient c/o right kidney pain. Patient states that MD is aware. Patient given heat pack for area

## 2021-10-17 NOTE — Progress Notes (Addendum)
Dressing changed per order. Tolerated well. Some noticeable swelling to patient's ear. Patient denies pain in that area.   Will continue to monitor

## 2021-10-17 NOTE — Plan of Care (Signed)
  Problem: Health Behavior/Discharge Planning: Goal: Ability to manage health-related needs will improve Outcome: Progressing   Problem: Clinical Measurements: Goal: Will remain free from infection Outcome: Progressing Goal: Diagnostic test results will improve Outcome: Progressing   

## 2021-10-17 NOTE — Progress Notes (Signed)
Pharmacy Antibiotic Note  [name] is a 30 y.o. male admitted on 10/11/2021 with  neck abscess/cellulitis .  Pharmacy has been consulted for vanc dosing along with levaquin/flagyl.   Pt is on D7 vanc/levofloxacin/flagyl for neck abscess and possible endocarditis. Dr Wilburn Cornelia from ENT performed I&D of deep neck abscess on 11/17. TEE on 11/17 showed tricuspid valve with echodensity concerning for vegetation and thickened annulus concerning for abscess.  Based on supratherapeutic VP 38 / VT 15 , patient was started on 1000 mg twice daily  Plan: Continue Vancomycin to 1 gram iv Q 12 hours with new predicted AUC of 528 Redraw vancomycin peak/trough to verify adequate dosing   Height: 5\' 6"  (167.6 cm) Weight: 80.3 kg (177 lb 0.5 oz) IBW/kg (Calculated) : 63.8  Temp (24hrs), Avg:98.1 F (36.7 C), Min:97.6 F (36.4 C), Max:98.5 F (36.9 C)  Recent Labs  Lab 10/11/21 1330 10/11/21 2020 10/12/21 0757 10/13/21 0139 10/13/21 0440 10/14/21 0134 10/14/21 1310 10/14/21 2000 10/15/21 0105 10/16/21 0212 10/17/21 0113  WBC 9.5  --    < >  --  15.9* 10.1  --   --  6.8 8.4 7.4  CREATININE 0.62  --   --  0.86  --   --   --   --  0.78 0.66  --   LATICACIDVEN 0.9 0.8  --   --   --   --   --   --   --   --   --   VANCOTROUGH  --   --   --   --   --   --   --  15  --   --   --   VANCOPEAK  --   --   --   --   --   --  38  --   --   --   --    < > = values in this interval not displayed.     Estimated Creatinine Clearance: 133.2 mL/min (by C-G formula based on SCr of 0.66 mg/dL).    Allergies  Allergen Reactions   Amoxicillin Other (See Comments)    Steven-Johnson's Syndrome   Ceclor [Cefaclor] Other (See Comments)    Steven-Johnson's Syndrome   Erythromycin Other (See Comments)    Steven-Johnson's Syndrome   Penicillins Other (See Comments)    Steven-Johnson's Syndrome   Septra [Sulfamethoxazole-Trimethoprim] Other (See Comments)    Steven-Johnson's Syndrome   Tylenol  [Acetaminophen] Other (See Comments)    Unknown reaction - doctor told pt not to take    11/13 vanc>> 11/13 levaquin>> 11/13 metronidazole>>   11/13 blood>>ngtd 11/17 abscess>> moderate S. 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  Reatha Harps, PharmD PGY1 Pharmacy Resident 10/17/2021 11:52 AM Check [email or web address] for unit specific pharmacy number

## 2021-10-17 NOTE — Progress Notes (Signed)
PROGRESS NOTE    [name]  VWU:[id number] DOB: 1991/04/23 DOA: 10/11/2021 PCP: Pcp, No    Brief Narrative:  Mr. [name] was admitted to the hospital with the working diagnosis of neck abscess, right lower neck, complicated with right internal jugular vein thrombophlebitis.    30 year old male past medical history for IV drug abuse, seizure disorder, opioid addiction and chronic headaches.  He presented painful neck edema on the right side.  Reported 3-4 days of edema on the right side of his neck, that progressed into his face.  Denied any dyspnea or dysphagia, but positive fever.  On his initial physical examination his blood pressure was 109/77, heart rate 87, respirate 16, oxygen saturation 98%, his lungs are clear to auscultation bilaterally, heart S1-S2, present, rhythmic, soft abdomen, lower extremity edema.  Positive right-sided neck edema.   Sodium 137, potassium 3.9, chloride 97, bicarb 30, glucose 103, BUN 10, creatinine 0.62, white count 9.5, hemoglobin 12.3, hematocrit 37.4, platelets 309. SARS COVID-19 negative.   Soft tissue CT neck with right internal jugular vein thrombophlebitis with extensive surrounding cellulitis/phlegmon and abscess in the right lower neck at the level of thyroid.  Surrounding spread of infection with retropharyngeal and hypopharyngeal edema and overlying sternocleidomastoid myositis.  Multiple chronic linear foraging bodies in the right lower neck possibly retained needles.  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   EKG 99 bpm, normal axis, normal intervals, sinus rhythm, no significant ST segment T wave changes.   Patient was placed on broad-spectrum antibiotic therapy.   11/13 transferred from [facility] to [facility] for ENT and vascular surgery evaluation.   Recommendations for conservative medical management, including anticoagulation.   11/16 Patient with no significant improvement in neck abscess, follow up neck CT soft tissue with increased  sized abscess associated with right thrombosed internal jugular vein, measuring 2.3 x 2,6 cm. 3 retained hypodermic needles in the nearby soft tissues.    11/17 Dr Annalee Genta from ENT performed I&D of deep neck abscess.  11/17 TEE with tricuspid valve with echodensity concerning for vegetation, thickened annulus concerning for abscess.  11/17 CT with no evidence of tricuspid valve annular abscess or right atrioventricular abscess.    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  SP I&D of right neck abscess with significant improvement in pain, no dyspnea or dysphagia. Culture positive for staphylococcus aureus (pending sensitivity), wbc is 7.4 and he has been afebrile.    CT negative for paravalvular abscess or atrio ventricular abscess. Discussed with CT surgery team and likely this is sufficient to rule out abscess at the valvular level unless cardiology has a very high pretest probability.     Antibiotic therapy with vancomycin, levofloxacin and metronidazole. Pain is well controlled with PRN oxycodone and hydromorphone. Continue anticoagulation with enoxaparin, plan to transition to oral anticoagulation if no further surgical intervention.    2. Seizure disorder.   On keppra and topiramate no active seizures.  Continue to encourage to be out of bed.    3. Chronic pain syndrome and substance abuse disorder/ tobacco abuse.  home patient  with buprenorphine.  On cyclobenzaprine as needed with good toleration.   Continue with nicotine patch today.     4, Hypokalemia/ hyponatremia.  Electrolytes have been corrected.    Patient continue to be at high risk for worsening endocarditis.   Status is: Inpatient  Remains inpatient appropriate because: IV antibiotic therapy  DVT prophylaxis: Enoxaparin   Code Status:    full  Family Communication:   His brother was at the bedside at the time of my examination     Consultants:  ENT ID Cardiology for TEE  Procedures:  Neck I & D  Antimicrobials:  Vancomycin, levofloxacin and metronidazole     Subjective: Patient with no nausea or vomiting, neck pain has improved with oral analgesics, no chest pain or dyspnea.   Objective: Vitals:   10/16/21 2006 10/16/21 2340 10/17/21 0404 10/17/21 0940  BP: 102/66 (!) 95/59 97/63 114/76  Pulse: 80 78 79 88  Resp: 17 16 18 18   Temp: 98.5 F (36.9 C) 98.2 F (36.8 C) 98.4 F (36.9 C) 97.7 F (36.5 C)  TempSrc: Oral Oral Oral Oral  SpO2: 98%  98% 100%  Weight:      Height:        Intake/Output Summary (Last 24 hours) at 10/17/2021 1217 Last data filed at 10/17/2021 0203 Gross per 24 hour  Intake 400 ml  Output --  Net 400 ml   Filed Weights   10/11/21 1231 10/15/21 1242 10/16/21 0409  Weight: 77.1 kg 77.1 kg 80.3 kg    Examination:   General: Not in pain or dyspnea.  Neurology: Awake and alert, non focal  E ENT: no pallor, no icterus, oral mucosa moist. Dressing in place Cardiovascular: No JVD. S1-S2 present, rhythmic, no gallops, rubs, or murmurs. No lower extremity edema. Pulmonary: positive breath sounds bilaterally, adequate air movement, no wheezing, rhonchi or rales. Gastrointestinal. Abdomen soft and non tender Skin. No rashes Musculoskeletal: no joint deformities     Data Reviewed: I have personally reviewed following labs and imaging studies  CBC: Recent Labs  Lab 10/11/21 1330 10/12/21 0757 10/13/21 0440 10/14/21 0134 10/15/21 0105 10/16/21 0212 10/17/21 0113  WBC 9.5   < > 15.9* 10.1 6.8 8.4 7.4   NEUTROABS 7.3  --   --   --   --   --   --   HGB 12.3*   < > 12.2* 12.0* 11.8* 11.6* 11.4*  HCT 37.4*   < > 35.8* 36.4* 35.9* 34.0* 33.7*  MCV 86.2   < > 83.8 86.3 86.3 83.3 84.0  PLT 309   < > 333 262 219 297 249   < > = values in this interval not displayed.   Basic Metabolic Panel: Recent Labs  Lab 10/11/21 1330 10/13/21 0139 10/15/21 0105 10/16/21 0212  NA 137 135 134* 134*  K 3.9 3.8 3.1* 4.2  CL 97* 107 104 106  CO2 30 19* 21* 21*  GLUCOSE 103* 208* 111* 173*  BUN 10 18 12 8   CREATININE 0.62 0.86 0.78 0.66  CALCIUM 9.2 9.0 8.1* 8.9   GFR: Estimated Creatinine Clearance: 133.2 mL/min (by C-G formula based on SCr of 0.66 mg/dL). Liver Function Tests: Recent Labs  Lab 10/11/21 1330  AST 12*  ALT 9  ALKPHOS 68  BILITOT 0.4  PROT 7.8  ALBUMIN 4.1   Recent Labs  Lab 10/11/21 1330  LIPASE 28   No results for input(s): AMMONIA in the last 168 hours. Coagulation Profile: No results for input(s): INR, PROTIME  in the last 168 hours. Cardiac Enzymes: No results for input(s): CKTOTAL, CKMB, CKMBINDEX, TROPONINI in the last 168 hours. BNP (last 3 results) No results for input(s): PROBNP in the last 8760 hours. HbA1C: No results for input(s): HGBA1C in the last 72 hours. CBG: No results for input(s): GLUCAP in the last 168 hours. Lipid Profile: No results for input(s): CHOL, HDL, LDLCALC, TRIG, CHOLHDL, LDLDIRECT in the last 72 hours. Thyroid Function Tests: No results for input(s): TSH, T4TOTAL, FREET4, T3FREE, THYROIDAB in the last 72 hours. Anemia Panel: No results for input(s): VITAMINB12, FOLATE, FERRITIN, TIBC, IRON, RETICCTPCT in the last 72 hours.    Radiology Studies: I have reviewed all of the imaging during this hospital visit personally     Scheduled Meds:  acetaminophen  650 mg Oral Q6H   enoxaparin (LOVENOX) injection  120 mg Subcutaneous Q24H   levETIRAcetam  750 mg Oral BID   levofloxacin  750 mg Oral q1800   metroNIDAZOLE  500 mg Oral  BID   nicotine  21 mg Transdermal Daily   polyethylene glycol  17 g Oral BID   senna  1 tablet Oral QHS   topiramate  100 mg Oral BID   Continuous Infusions:  vancomycin 1,000 mg (10/17/21 0956)     LOS: 6 days        Domingos Riggi Annett Gula, MD

## 2021-10-18 DIAGNOSIS — M545 Low back pain, unspecified: Secondary | ICD-10-CM | POA: Diagnosis not present

## 2021-10-18 DIAGNOSIS — I808 Phlebitis and thrombophlebitis of other sites: Secondary | ICD-10-CM | POA: Diagnosis not present

## 2021-10-18 DIAGNOSIS — L0211 Cutaneous abscess of neck: Secondary | ICD-10-CM | POA: Diagnosis not present

## 2021-10-18 DIAGNOSIS — S1095XA Superficial foreign body of unspecified part of neck, initial encounter: Secondary | ICD-10-CM | POA: Diagnosis not present

## 2021-10-18 LAB — BASIC METABOLIC PANEL
Anion gap: 8 (ref 5–15)
BUN: 12 mg/dL (ref 6–20)
CO2: 25 mmol/L (ref 22–32)
Calcium: 8.8 mg/dL — ABNORMAL LOW (ref 8.9–10.3)
Chloride: 104 mmol/L (ref 98–111)
Creatinine, Ser: 0.74 mg/dL (ref 0.61–1.24)
GFR, Estimated: >60 mL/min (ref >60)
Glucose, Bld: 81 mg/dL (ref 70–99)
Potassium: 4.1 mmol/L (ref 3.5–5.1)
Sodium: 137 mmol/L (ref 135–145)

## 2021-10-18 LAB — VANCOMYCIN, PEAK: Vancomycin Pk: 24 ug/mL — ABNORMAL LOW (ref 30–40)

## 2021-10-18 LAB — VANCOMYCIN, TROUGH: Vancomycin Tr: 8 ug/mL — ABNORMAL LOW (ref 15–20)

## 2021-10-18 MED ORDER — ENOXAPARIN SODIUM 80 MG/0.8ML IJ SOSY
80.0000 mg | PREFILLED_SYRINGE | Freq: Two times a day (BID) | INTRAMUSCULAR | Status: DC
  Administered 2021-10-19: 80 mg via SUBCUTANEOUS
  Filled 2021-10-18: qty 0.8

## 2021-10-18 MED ORDER — VANCOMYCIN HCL 1250 MG/250ML IV SOLN
1250.0000 mg | Freq: Two times a day (BID) | INTRAVENOUS | Status: DC
  Administered 2021-10-18 – 2021-10-19 (×2): 1250 mg via INTRAVENOUS
  Filled 2021-10-18 (×2): qty 250

## 2021-10-18 MED ORDER — CYCLOBENZAPRINE HCL 10 MG PO TABS
5.0000 mg | ORAL_TABLET | Freq: Three times a day (TID) | ORAL | Status: DC
  Administered 2021-10-18 – 2021-10-19 (×3): 5 mg via ORAL
  Filled 2021-10-18 (×3): qty 1

## 2021-10-18 NOTE — Progress Notes (Signed)
Dressing changed to neck. Dr. Redmond Baseman in room. [name], Manuel Gavia RN

## 2021-10-18 NOTE — Progress Notes (Signed)
Patient with complaints still of right flank/kidney pain. MD is aware. Patient given heat for the area. Kassidy Frankson, Bettina Gavia RN

## 2021-10-18 NOTE — Progress Notes (Addendum)
Pharmacy Antibiotic Note  [name] is a 30 y.o. male admitted on 10/11/2021 with  neck abscess/cellulitis .  Pharmacy has been consulted for vanc dosing along with levaquin/flagyl.   Assessment Pt is on vanc/levofloxacin/flagyl for neck abscess and possible endocarditis. Dr Wilburn Cornelia from ENT performed I&D of deep neck abscess on 11/17. TEE on 11/17 showed tricuspid valve with echodensity concerning for vegetation and thickened annulus concerning for abscess.  Pharmacokinetic parameters calculated with VP 24 / VT 8 on vancomycin 1000 mg twice daily leads to subtherapeutic AUC of 346, signifying improved renal function from prior.WBC WNL and afebrile.  Based on clearance of 96.2 mL/min, VD 51.4 L, and 6.2 hour halflife, will adjust dosing to obtain goal AUC/MIC  of 400-600.  Plan: Increase vancomycin to 1250 mg every 12 hours to provide an AUC of 432 with current vancomycin clearance calculated from peak and trough Continue other antibiotics as ordered and follow ID recs  Height: 5\' 6"  (167.6 cm) Weight: 80.3 kg (177 lb 0.5 oz) IBW/kg (Calculated) : 63.8  Temp (24hrs), Avg:97.8 F (36.6 C), Min:97.4 F (36.3 C), Max:98 F (36.7 C)  Recent Labs  Lab 10/11/21 1330 10/11/21 2020 10/12/21 0757 10/13/21 0139 10/13/21 0440 10/14/21 0134 10/14/21 1310 10/14/21 2000 10/15/21 0105 10/16/21 0212 10/17/21 0113 10/18/21 0019 10/18/21 1006  WBC 9.5  --    < >  --  15.9* 10.1  --   --  6.8 8.4 7.4  --   --   CREATININE 0.62  --   --  0.86  --   --   --   --  0.78 0.66  --   --  0.74  LATICACIDVEN 0.9 0.8  --   --   --   --   --   --   --   --   --   --   --   VANCOTROUGH  --   --   --   --   --   --   --  15  --   --   --   --  8*  VANCOPEAK  --   --   --   --   --   --  38  --   --   --   --  24*  --    < > = values in this interval not displayed.     Estimated Creatinine Clearance: 133.2 mL/min (by C-G formula based on SCr of 0.74 mg/dL).    Allergies  Allergen Reactions    Amoxicillin Other (See Comments)    Steven-Johnson's Syndrome   Ceclor [Cefaclor] Other (See Comments)    Steven-Johnson's Syndrome   Erythromycin Other (See Comments)    Steven-Johnson's Syndrome   Penicillins Other (See Comments)    Steven-Johnson's Syndrome   Septra [Sulfamethoxazole-Trimethoprim] Other (See Comments)    Steven-Johnson's Syndrome   Tylenol [Acetaminophen] Other (See Comments)    Unknown reaction - doctor told pt not to take   Dose adjustments: 11/16 VP 38 VT 15 - > AUC 705 - decreased to 1 gram Q 12  11/13 vanc>> 11/13 levaquin>> 11/13 metronidazole>>   11/13 blood>>ngtd 11/17 abscess>> moderate S. 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  Reatha Harps, PharmD PGY1 Pharmacy Resident 10/18/2021 11:30 AM Check [email or web address] for unit specific pharmacy number

## 2021-10-18 NOTE — Progress Notes (Addendum)
Subjective: Feeling good.  Objective: Vital signs in last 24 hours: Temp:  [97.6 F (36.4 C)-98 F (36.7 C)] 97.7 F (36.5 C) (11/20 1237) Pulse Rate:  [79-99] 93 (11/20 1240) Resp:  [18-20] 18 (11/20 1237) BP: (100-110)/(68-79) 107/69 (11/20 1240) SpO2:  [96 %-100 %] 100 % (11/20 1240) Wt Readings from Last 1 Encounters:  10/16/21 80.3 kg    Intake/Output from previous day: 11/19 0701 - 11/20 0700 In: 240 [P.O.:240] Out: -  Intake/Output this shift: Total I/O In: 300 [IV Piggyback:300] Out: -   General appearance: alert, cooperative, and no distress Neck: Penrose drain in right lower neck, minimal drainage  Recent Labs    10/16/21 0212 10/17/21 0113  WBC 8.4 7.4  HGB 11.6* 11.4*  HCT 34.0* 33.7*  PLT 297 249    Recent Labs    10/16/21 0212 10/18/21 1006  NA 134* 137  K 4.2 4.1  CL 106 104  CO2 21* 25  GLUCOSE 173* 81  BUN 8 12  CREATININE 0.66 0.74  CALCIUM 8.9 8.8*    Medications: I have reviewed the patient's current medications.  Assessment/Plan: Deep neck abscess s/p I&D  Looking great.  No fever, WBC normal.  Likely drain removal early this week. Culture grew MRSA.  Currently on Levaquin, Flagyl, and vancomycin.  Adjust per ID.   LOS: 7 days   Melida Quitter 10/18/2021, 1:06 PM

## 2021-10-18 NOTE — Progress Notes (Signed)
PROGRESS NOTE    [name]  NGE:[id number] DOB: [birth year] DOA: 10/11/2021 PCP: Pcp, No    Brief Narrative:  [name] was admitted to the hospital with the working diagnosis of right neck abscess,  complicated with right internal jugular vein thrombophlebitis and tricuspid valve endocarditis.     30 year old male past medical history for IV drug abuse, seizure disorder, opioid addiction and chronic headaches.  He presented painful neck edema on the right side.  Reported 3-4 days of edema on the right side of his neck, that progressed into his face.  Denied any dyspnea or dysphagia, but positive fever.  On his initial physical examination his blood pressure was 109/77, heart rate 87, respirate 16, oxygen saturation 98%, his lungs are clear to auscultation bilaterally, heart S1-S2, present, rhythmic, soft abdomen, lower extremity edema.  Positive right-sided neck edema.   Sodium 137, potassium 3.9, chloride 97, bicarb 30, glucose 103, BUN 10, creatinine 0.62, white count 9.5, hemoglobin 12.3, hematocrit 37.4, platelets 309. SARS COVID-19 negative.   Soft tissue CT neck with right internal jugular vein thrombophlebitis with extensive surrounding cellulitis/phlegmon and abscess in the right lower neck at the level of thyroid.  Surrounding spread of infection with retropharyngeal and hypopharyngeal edema and overlying sternocleidomastoid myositis.  Multiple chronic linear foraging bodies in the right lower neck possibly retained needles.  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   EKG 99 bpm, normal axis, normal intervals, sinus rhythm, no significant ST segment T wave changes.   Patient was placed on broad-spectrum antibiotic therapy.   11/13 transferred from [facility] to [facility] for ENT and vascular surgery evaluation.   Recommendations for conservative medical management, including anticoagulation.   11/16 Patient with no significant improvement in neck abscess, follow up neck CT soft  tissue with increased sized abscess associated with right thrombosed internal jugular vein, measuring 2.3 x 2,6 cm. 3 retained hypodermic needles in the nearby soft tissues.    11/17 Dr Annalee Genta from ENT performed I&D of deep neck abscess.  11/17 TEE with tricuspid valve with echodensity concerning for vegetation, thickened annulus concerning for abscess.  11/17 CT with no evidence of tricuspid valve annular abscess or right atrioventricular abscess.     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  SP I&D of right neck abscess with significant improvement in pain, no dyspnea or dysphagia. Culture positive for staphylococcus aureus (pending sensitivity),  TEE with tricuspid valve vegetation and thickened annulus  CT negative for paravalvular abscess or atrio ventricular abscess.   Discussed with CT surgery team and likely this is sufficient to rule out abscess at the valvular level unless cardiology has a very high pretest probability.     Plan to continue with vancomycin, levofloxacin and metronidazole. Pending sensitivities before de-escalation.   Analgesia with PRN oxycodone and hydromorphone. Anticoagulation with enoxaparin, if no further invasive procedure will transition ot apixaban.    2. Seizure disorder.   Continue with keppra and topiramate no active seizures.  Encourage to be out of bed.    3. Chronic pain syndrome and  substance abuse disorder/ tobacco abuse.  home patient with buprenorphine.   Patient with muscle spasms on the  right paraspinal region will change cyclobenzaprine form PRN to scheduled TID, if persistent pain may need spine MRI to rule out infection.   Nicotine patch, smoking cessation counseling     4, Hypokalemia/ hyponatremia.  Resolved.    Patient continue to be at high risk for worsening endocarditis.   Status is: Inpatient  Remains inpatient appropriate because: IV antibiotic therapy   DVT prophylaxis: Enoxaparin   Code Status:   Full  Family Communication:   No family at the bedside    Consultants:  ID Cardiology for TEE  ENT   Procedures:  Neck abscess I&D   Antimicrobials:  Vancomycin, levofloxacin and metronidazole     Subjective: Patient today with right flank pain, worse with movement, no improving factors, no radiation.   Objective: Vitals:   10/17/21 1942 10/17/21 2306 10/18/21 0300 10/18/21 0753  BP: 110/79 106/70 100/68 103/68  Pulse: 99 83 99 79  Resp: 18 18 20 18   Temp: 97.6 F (36.4 C) 98 F (36.7 C) 97.9 F (36.6 C) 98 F (36.7 C)  TempSrc: Oral Oral Oral Oral  SpO2: 100% 100% 96% 99%  Weight:      Height:        Intake/Output Summary (Last 24 hours) at 10/18/2021 1107 Last data filed at 10/18/2021 0743 Gross per 24 hour  Intake 540 ml  Output --  Net 540 ml   Filed Weights   10/11/21 1231 10/15/21 1242 10/16/21 0409  Weight: 77.1 kg 77.1 kg 80.3 kg    Examination:   General: Not in pain or dyspnea Neurology: Awake and alert, non focal  E ENT: no pallor, no icterus, oral mucosa moist Cardiovascular: No JVD. S1-S2 present, rhythmic, no gallops, rubs, or murmurs. No lower extremity edema. Pulmonary: vesicular breath sounds bilaterally, adequate air movement, no wheezing, rhonchi or rales. Gastrointestinal. Abdomen soft and non tender Skin. No rashes Musculoskeletal: pain at the right flank the musculature but no tenderness on spine bony process     Data Reviewed: I have personally reviewed following labs and imaging  studies  CBC: Recent Labs  Lab 10/11/21 1330 10/12/21 0757 10/13/21 0440 10/14/21 0134 10/15/21 0105 10/16/21 0212 10/17/21 0113  WBC 9.5   < > 15.9* 10.1 6.8 8.4 7.4  NEUTROABS 7.3  --   --   --   --   --   --   HGB 12.3*   < > 12.2* 12.0* 11.8* 11.6* 11.4*  HCT 37.4*   < > 35.8* 36.4* 35.9* 34.0* 33.7*  MCV 86.2   < > 83.8 86.3 86.3 83.3 84.0  PLT 309   < > 333 262 219 297 249   < > = values in this interval not displayed.   Basic Metabolic Panel: Recent Labs  Lab 10/11/21 1330 10/13/21 0139 10/15/21 0105 10/16/21 0212  NA 137 135 134* 134*  K 3.9 3.8 3.1* 4.2  CL 97* 107 104 106  CO2 30 19* 21* 21*  GLUCOSE 103* 208* 111* 173*  BUN 10 18 12 8   CREATININE 0.62 0.86 0.78 0.66  CALCIUM 9.2 9.0 8.1* 8.9   GFR: Estimated Creatinine Clearance: 133.2 mL/min (by C-G formula based on SCr of 0.66 mg/dL). Liver Function Tests: Recent Labs  Lab 10/11/21 1330  AST 12*  ALT 9  ALKPHOS 68  BILITOT 0.4  PROT 7.8  ALBUMIN 4.1   Recent Labs  Lab 10/11/21  1330  LIPASE 28   No results for input(s): AMMONIA in the last 168 hours. Coagulation Profile: No results for input(s): INR, PROTIME in the last 168 hours. Cardiac Enzymes: No results for input(s): CKTOTAL, CKMB, CKMBINDEX, TROPONINI in the last 168 hours. BNP (last 3 results) No results for input(s): PROBNP in the last 8760 hours. HbA1C: No results for input(s): HGBA1C in the last 72 hours. CBG: No results for input(s): GLUCAP in the last 168 hours. Lipid Profile: No results for input(s): CHOL, HDL, LDLCALC, TRIG, CHOLHDL, LDLDIRECT in the last 72 hours. Thyroid Function Tests: No results for input(s): TSH, T4TOTAL, FREET4, T3FREE, THYROIDAB in the last 72 hours. Anemia Panel: No results for input(s): VITAMINB12, FOLATE, FERRITIN, TIBC, IRON, RETICCTPCT in the last 72 hours.    Radiology Studies: I have reviewed all of the imaging during this hospital visit personally     Scheduled Meds:   acetaminophen  650 mg Oral Q6H   enoxaparin (LOVENOX) injection  120 mg Subcutaneous Q24H   levETIRAcetam  750 mg Oral BID   levofloxacin  750 mg Oral q1800   metroNIDAZOLE  500 mg Oral BID   nicotine  21 mg Transdermal Daily   polyethylene glycol  17 g Oral BID   senna  1 tablet Oral QHS   topiramate  100 mg Oral BID   Continuous Infusions:  vancomycin 1,000 mg (10/18/21 1106)     LOS: 7 days        Camar Guyton Annett Gula, MD

## 2021-10-19 ENCOUNTER — Other Ambulatory Visit (HOSPITAL_COMMUNITY): Payer: Self-pay

## 2021-10-19 ENCOUNTER — Encounter (HOSPITAL_COMMUNITY): Payer: Self-pay | Admitting: Cardiovascular Disease

## 2021-10-19 LAB — CBC
HCT: 36.5 % — ABNORMAL LOW (ref 39.0–52.0)
Hemoglobin: 12.3 g/dL — ABNORMAL LOW (ref 13.0–17.0)
MCH: 28.6 pg (ref 26.0–34.0)
MCHC: 33.7 g/dL (ref 30.0–36.0)
MCV: 84.9 fL (ref 80.0–100.0)
Platelets: 304 10*3/uL (ref 150–400)
RBC: 4.3 MIL/uL (ref 4.22–5.81)
RDW: 12.6 % (ref 11.5–15.5)
WBC: 6 10*3/uL (ref 4.0–10.5)
nRBC: 0 % (ref 0.0–0.2)

## 2021-10-19 MED ORDER — RIVAROXABAN 20 MG PO TABS
20.0000 mg | ORAL_TABLET | Freq: Every day | ORAL | 2 refills | Status: DC
  Filled 2021-10-19: qty 30, 30d supply, fill #0

## 2021-10-19 MED ORDER — DOXYCYCLINE HYCLATE 100 MG PO TABS
100.0000 mg | ORAL_TABLET | Freq: Two times a day (BID) | ORAL | 0 refills | Status: AC
  Filled 2021-10-19: qty 84, 42d supply, fill #0

## 2021-10-19 MED ORDER — ORITAVANCIN DIPHOSPHATE 400 MG IV SOLR
1200.0000 mg | Freq: Once | INTRAVENOUS | Status: AC
  Administered 2021-10-19: 1200 mg via INTRAVENOUS
  Filled 2021-10-19: qty 120

## 2021-10-19 MED ORDER — DOXYCYCLINE HYCLATE 100 MG PO TABS: 100.0000 mg | ORAL_TABLET | Freq: Two times a day (BID) | ORAL | Status: DC

## 2021-10-19 NOTE — Progress Notes (Signed)
Subjective:  He is eager to go home  Antibiotics:  Anti-infectives (From admission, onward)    Start     Dose/Rate Route Frequency Ordered Stop   10/18/21 2230  vancomycin (VANCOREADY) IVPB 1250 mg/250 mL        1,250 mg 166.7 mL/hr over 90 Minutes Intravenous Every 12 hours 10/18/21 1136     10/14/21 2230  vancomycin (VANCOREADY) IVPB 1000 mg/200 mL  Status:  Discontinued        1,000 mg 200 mL/hr over 60 Minutes Intravenous Every 12 hours 10/14/21 2131 10/18/21 1136   10/13/21 1800  metroNIDAZOLE (FLAGYL) tablet 500 mg  Status:  Discontinued        500 mg Oral 2 times daily 10/13/21 1303 10/19/21 1035   10/13/21 1700  levofloxacin (LEVAQUIN) tablet 750 mg  Status:  Discontinued        750 mg Oral Daily-1800 10/13/21 1303 10/19/21 1035   10/12/21 1800  levofloxacin (LEVAQUIN) IVPB 750 mg  Status:  Discontinued        750 mg 100 mL/hr over 90 Minutes Intravenous Every 24 hours 10/11/21 1913 10/13/21 1303   10/12/21 0800  vancomycin (VANCOREADY) IVPB 1500 mg/300 mL  Status:  Discontinued        1,500 mg 150 mL/hr over 120 Minutes Intravenous Every 12 hours 10/11/21 1928 10/14/21 0936   10/12/21 0600  metroNIDAZOLE (FLAGYL) IVPB 500 mg  Status:  Discontinued        500 mg 100 mL/hr over 60 Minutes Intravenous Every 12 hours 10/11/21 1913 10/11/21 1921   10/12/21 0000  vancomycin (VANCOREADY) IVPB 1500 mg/300 mL  Status:  Discontinued        1,500 mg 150 mL/hr over 120 Minutes Intravenous Every 12 hours 10/11/21 1928 10/11/21 1928   10/11/21 1700  vancomycin (VANCOREADY) IVPB 1500 mg/300 mL        1,500 mg 150 mL/hr over 120 Minutes Intravenous  Once 10/11/21 1645 10/11/21 2300   10/11/21 1700  levofloxacin (LEVAQUIN) IVPB 750 mg        750 mg 100 mL/hr over 90 Minutes Intravenous  Once 10/11/21 1645 10/11/21 1956   10/11/21 1645  metroNIDAZOLE (FLAGYL) IVPB 500 mg  Status:  Discontinued        500 mg 100 mL/hr over 60 Minutes Intravenous Every 12 hours 10/11/21 1640  10/13/21 1303       Medications: Scheduled Meds:  acetaminophen  650 mg Oral Q6H   cyclobenzaprine  5 mg Oral TID   enoxaparin (LOVENOX) injection  80 mg Subcutaneous Q12H   levETIRAcetam  750 mg Oral BID   nicotine  21 mg Transdermal Daily   polyethylene glycol  17 g Oral BID   senna  1 tablet Oral QHS   topiramate  100 mg Oral BID   Continuous Infusions:  vancomycin 1,250 mg (10/19/21 0900)   PRN Meds:.HYDROmorphone (DILAUDID) injection, ondansetron **OR** ondansetron (ZOFRAN) IV, oxyCODONE    Objective: Weight change:   Intake/Output Summary (Last 24 hours) at 10/19/2021 1059 Last data filed at 10/18/2021 2000 Gross per 24 hour  Intake 560 ml  Output --  Net 560 ml   Blood pressure 107/72, pulse 83, temperature 98 F (36.7 C), temperature source Oral, resp. rate 18, height 5\' 6"  (1.676 m), weight 80.3 kg, SpO2 100 %. Temp:  [97.7 F (36.5 C)-98.6 F (37 C)] 98 F (36.7 C) (11/21 0856) Pulse Rate:  [76-99] 83 (11/21 0856) Resp:  [18] 18 (11/21  2637) BP: (105-119)/(69-85) 107/72 (11/21 0856) SpO2:  [99 %-100 %] 100 % (11/21 0856)  Physical Exam: Physical Exam Vitals reviewed.  Constitutional:      Appearance: He is well-developed.  HENT:     Head: Normocephalic and atraumatic.  Eyes:     General:        Right eye: No discharge.        Left eye: No discharge.     Extraocular Movements: Extraocular movements intact.     Conjunctiva/sclera: Conjunctivae normal.  Cardiovascular:     Rate and Rhythm: Normal rate and regular rhythm.  Pulmonary:     Effort: Pulmonary effort is normal. No respiratory distress.     Breath sounds: No stridor. No wheezing.  Abdominal:     General: There is no distension.     Palpations: Abdomen is soft.  Musculoskeletal:        General: Normal range of motion.     Cervical back: Normal range of motion and neck supple.  Skin:    General: Skin is warm and dry.     Coloration: Skin is pale.     Findings: No erythema or rash.   Neurological:     General: No focal deficit present.     Mental Status: He is alert and oriented to person, place, and time.  Psychiatric:        Mood and Affect: Mood normal.        Behavior: Behavior normal.        Thought Content: Thought content normal.        Judgment: Judgment normal.     CBC:    BMET Recent Labs    10/18/21 1006  NA 137  K 4.1  CL 104  CO2 25  GLUCOSE 81  BUN 12  CREATININE 0.74  CALCIUM 8.8*     Liver Panel  No results for input(s): PROT, ALBUMIN, AST, ALT, ALKPHOS, BILITOT, BILIDIR, IBILI in the last 72 hours.     Sedimentation Rate No results for input(s): ESRSEDRATE in the last 72 hours. C-Reactive Protein No results for input(s): CRP in the last 72 hours.  Micro Results: Recent Results (from the past 720 hour(s))  Blood culture (routine x 2)     Status: None   Collection Time: 10/11/21  2:15 PM   Specimen: BLOOD  Result Value Ref Range Status   Specimen Description   Final    BLOOD RIGHT ANTECUBITAL Performed at [facility] [address], [place], [place] [address]    Special Requests   Final    BOTTLES DRAWN AEROBIC AND ANAEROBIC Blood Culture adequate volume Performed at [facility] [address], [place], [place] [address]    Culture   Final    NO GROWTH 5 DAYS Performed at [facility], [place] [address], [place],  [address]    Report Status 10/16/2021 FINAL  Final  Resp Panel by RT-PCR (Flu A&B, Covid)     Status: None   Collection Time: 10/11/21  2:26 PM   Specimen: Nasopharyngeal(NP) swabs in vial transport medium  Result Value Ref Range Status   SARS Coronavirus 2 by RT PCR NEGATIVE NEGATIVE Final    Comment: (NOTE) SARS-CoV-2 target nucleic acids are NOT DETECTED.  The SARS-CoV-2 RNA is generally detectable in upper respiratory specimens during the acute phase of infection. The lowest concentration of SARS-CoV-2 viral copies this assay can detect is 138  copies/mL. A negative result does not preclude SARS-Cov-2  infection and should not be used as the sole basis for treatment or other patient management decisions. A negative result may occur with  improper specimen collection/handling, submission of specimen other than nasopharyngeal swab, presence of viral mutation(s) within the areas targeted by this assay, and inadequate number of viral copies(<138 copies/mL). A negative result must be combined with clinical observations, patient history, and epidemiological information. The expected result is Negative.  Fact Sheet for Patients:  EntrepreneurPulse.com.au  Fact Sheet for Healthcare Providers:  IncredibleEmployment.be  This test is no t yet approved or cleared by the [place] FDA and  has been authorized for detection and/or diagnosis of SARS-CoV-2 by FDA under an Emergency Use Authorization (EUA). This EUA will remain  in effect (meaning this test can be used) for the duration of the COVID-19 declaration under Section 564(b)(1) of the Act, 21 U.S.C.section 360bbb-3(b)(1), unless the authorization is terminated  or revoked sooner.       Influenza A by PCR NEGATIVE NEGATIVE Final   Influenza B by PCR NEGATIVE NEGATIVE Final    Comment: (NOTE) The Xpert Xpress SARS-CoV-2/FLU/RSV plus assay is intended as an aid in the diagnosis of influenza from Nasopharyngeal swab specimens and should not be used as a sole basis for treatment. Nasal washings and aspirates are unacceptable for Xpert Xpress SARS-CoV-2/FLU/RSV testing.  Fact Sheet for Patients: EntrepreneurPulse.com.au  Fact Sheet for Healthcare Providers: IncredibleEmployment.be  This test is not yet approved or cleared by the [place] FDA and has been authorized for detection and/or diagnosis of SARS-CoV-2 by FDA under an Emergency Use Authorization (EUA). This EUA will remain in effect (meaning  this test can be used) for the duration of the COVID-19 declaration under Section 564(b)(1) of the Act, 21 U.S.C. section 360bbb-3(b)(1), unless the authorization is terminated or revoked.  Performed at [facility], [place] [address], [place], [place] [address]   Culture, blood (Routine X 2) w Reflex to ID Panel     Status: None   Collection Time: 10/11/21  8:20 PM   Specimen: BLOOD  Result Value Ref Range Status   Specimen Description   Final    BLOOD LEFT ANTECUBITAL Performed at [facility] [address] [place], [place] [address]    Special Requests   Final    BOTTLES DRAWN AEROBIC AND ANAEROBIC Blood Culture adequate volume Performed at [facility] [address] [place], [place] [address]    Culture   Final    NO GROWTH 5 DAYS Performed at [facility], [place] [address] [place], [place] [address]    Report Status 10/17/2021 FINAL  Final  MRSA Next Gen by PCR, Nasal     Status: None   Collection Time: 10/12/21 12:07 PM   Specimen: Nasal Mucosa; Nasal Swab  Result Value Ref Range Status   MRSA by PCR Next Gen NOT DETECTED NOT DETECTED Final    Comment: (NOTE) The GeneXpert MRSA Assay (FDA approved for NASAL specimens only), is one component of a comprehensive MRSA colonization surveillance program. It is not intended to diagnose MRSA infection nor to guide or monitor treatment for MRSA infections. Test performance is not FDA approved in patients less than 9 years old. Performed at [facility], [place] [address] [place], [place] [address]   Aerobic/Anaerobic Culture w Gram Stain (surgical/deep wound)     Status: None (Preliminary result)   Collection Time: 10/15/21  6:20 PM   Specimen: PATH Other; ENT  Result  Value Ref Range Status   Specimen Description ABSCESS NECK  Final   Special Requests PT ON LEVAQUIN,FLAGYL, VANC  Final   Gram Stain   Final    NO SQUAMOUS EPITHELIAL CELLS SEEN FEW WBC  SEEN FEW GRAM POSITIVE COCCI Performed at [facility], [place] [address] [place], [place] [address]    Culture   Final    MODERATE METHICILLIN RESISTANT STAPHYLOCOCCUS AUREUS NO ANAEROBES ISOLATED; CULTURE IN PROGRESS FOR 5 DAYS    Report Status PENDING  Incomplete   Organism ID, Bacteria METHICILLIN RESISTANT STAPHYLOCOCCUS AUREUS  Final      Susceptibility   Methicillin resistant staphylococcus aureus - MIC*    CIPROFLOXACIN >=8 RESISTANT Resistant     ERYTHROMYCIN >=8 RESISTANT Resistant     GENTAMICIN <=0.5 SENSITIVE Sensitive     OXACILLIN >=4 RESISTANT Resistant     TETRACYCLINE <=1 SENSITIVE Sensitive     VANCOMYCIN <=0.5 SENSITIVE Sensitive     TRIMETH/SULFA >=320 RESISTANT Resistant     CLINDAMYCIN >=8 RESISTANT Resistant     RIFAMPIN <=0.5 SENSITIVE Sensitive     Inducible Clindamycin NEGATIVE Sensitive     * MODERATE METHICILLIN RESISTANT STAPHYLOCOCCUS AUREUS    Studies/Results: No results found.    Assessment/Plan:  INTERVAL HISTORY: CT A did not show TV abscess   Principal Problem:   Neck abscess Active Problems:   Thrombophlebitis of internal jugular vein   Seizure disorder ([place])   Foreign body of neck   Opioid use disorder, moderate, in sustained remission ([place])   Back pain   Tricuspid valve vegetation    [name] is a 30 y.o. male with  IV drug use, admitted with R IJ septic thrombophlebitis with neck abscess multiple foreign bodies status post I&D by ear nose and throat with culture growing methicillin-resistant Staph aureus.  Blood cultures have been sterile.  There was concern for not only tricuspid valve endocarditis but also possible tricuspid valve annular abscess.  After TEE showed concern for abscess get gated CT angiogram failed to show this.  The patient is himself quite eager to go home.  #1 Septic internal jugular thrombophlebitis:  We will give him a dose of oritavancin today along with 6-week course of oral  doxycycline.  #2 IVDU: on suboxone claims to have been clean for 2 years but this is not consistent clearly with his presentation on the form bodies in his neck.  I spent 36  minutes with the patient including face to face counseling of the patient re his IJ thrombophlebitis, neck abscess, concern for TV abscess, personally reviewing CTA, TEE, along with updated culture data, CBC BMP and with review of medical records before and during the visit and in coordination of his care.    DAMONT BALLES has an appointment on 11/06/2021 at 69 with Dr. West Bali  The [facility] is located in the [facility] at  [address] in [place].  [address], which is located to the left of the elevators.  Phone: [phone]  Fax: [phone]  [email or web address]  He should arrive 15-30 minutes prior to his appointment.   LOS: 8 days   Alcide Evener 10/19/2021, 10:59 AM

## 2021-10-19 NOTE — Discharge Instructions (Signed)
Information on my medicine - XARELTO (rivaroxaban)  This medication education was reviewed with me or my healthcare representative as part of my discharge preparation.    WHY WAS XARELTO PRESCRIBED FOR YOU? Xarelto was prescribed to treat blood clots that may have been found in the veins of your legs (deep vein thrombosis) or in your lungs (pulmonary embolism) and to reduce the risk of them occurring again.  What do you need to know about Xarelto? The  dose is one 20 mg tablet taken ONCE A DAY with your evening meal.  DO NOT stop taking Xarelto without talking to the health care provider who prescribed the medication.  Refill your prescription for 20 mg tablets before you run out.  After discharge, you should have regular check-up appointments with your healthcare provider that is prescribing your Xarelto.  In the future your dose may need to be changed if your kidney function changes by a significant amount.  What do you do if you miss a dose? If you are taking Xarelto TWICE DAILY and you miss a dose, take it as soon as you remember. You may take two 15 mg tablets (total 30 mg) at the same time then resume your regularly scheduled 15 mg twice daily the next day.  If you are taking Xarelto ONCE DAILY and you miss a dose, take it as soon as you remember on the same day then continue your regularly scheduled once daily regimen the next day. Do not take two doses of Xarelto at the same time.   Important Safety Information Xarelto is a blood thinner medicine that can cause bleeding. You should call your healthcare provider right away if you experience any of the following: Bleeding from an injury or your nose that does not stop. Unusual colored urine (red or dark brown) or unusual colored stools (red or black). Unusual bruising for unknown reasons. A serious fall or if you hit your head (even if there is no bleeding).  Some medicines may interact with Xarelto and might increase your  risk of bleeding while on Xarelto. To help avoid this, consult your healthcare provider or pharmacist prior to using any new prescription or non-prescription medications, including herbals, vitamins, non-steroidal anti-inflammatory drugs (NSAIDs) and supplements.  This website has more information on Xarelto: www.xarelto.com. ===================================================  Deep Vein Thrombosis    Deep vein thrombosis (DVT) is a condition in which a blood clot forms in a deep vein, such as a lower leg, thigh, or arm vein. A clot is blood that has thickened into a gel or solid. This condition is dangerous. It can lead to serious and even life-threatening complications if the clot travels to the lungs and causes a blockage (pulmonary embolism). It can also damage veins in the leg. This can result in leg pain, swelling, discoloration, and sores (post-thrombotic syndrome).  What are the causes? This condition may be caused by: A slowdown of blood flow. Damage to a vein. A condition that causes blood to clot more easily, such as an inherited clotting disorder.  What increases the risk? The following factors may make you more likely to develop this condition: Being overweight. Being older, especially over age 60. Sitting or lying down for more than four hours. Being in the hospital. Lack of physical activity (sedentary lifestyle). Pregnancy, being in childbirth, or having recently given birth. Taking medicines that contain estrogen, such as medicines to prevent pregnancy. Smoking. A history of any of the following: Blood clots or a blood clotting disease. Peripheral   vascular disease. Inflammatory bowel disease. Cancer. Heart disease. Genetic conditions that affect how your blood clots, such as Factor V Leiden mutation. Neurological diseases that affect your legs (leg paresis). A recent injury, such as a car accident. Major or lengthy surgery. A central line placed inside a large  vein.  What are the signs or symptoms? Symptoms of this condition include: Swelling, pain, or tenderness in an arm or leg. Warmth, redness, or discoloration in an arm or leg. If the clot is in your leg, symptoms may be more noticeable or worse when you stand or walk. Some people may not develop any symptoms.  How is this diagnosed? This condition is diagnosed with: A medical history and physical exam. Tests, such as: Blood tests. These are done to check how well your blood clots. Ultrasound. This is done to check for clots. Venogram. For this test, contrast dye is injected into a vein and X-rays are taken to check for any clots  How is this treated? Treatment for this condition depends on: The cause of your DVT. Your risk for bleeding or developing more clots. Any other medical conditions that you have. Treatment may include: Taking a blood thinner (anticoagulant). This type of medicine prevents clots from forming. It may be taken by mouth, injected under the skin, or injected through an IV (catheter). Injecting clot-dissolving medicines into the affected vein (catheter-directed thrombolysis). Having surgery. Surgery may be done to: Remove the clot. Place a filter in a large vein to catch blood clots before they reach the lungs. Some treatments may be continued for up to six months.  Follow these instructions at home: If you are taking blood thinners: Take the medicine exactly as told by your health care provider. Some blood thinners need to be taken at the same time every day. Do not skip a dose. Talk with your health care provider before you take any medicines that contain aspirin or NSAIDs. These medicines increase your risk for dangerous bleeding. Ask your health care provider about foods and drugs that could change the way the medicine works (may interact). Avoid those things if your health care provider tells you to do so. Blood thinners can cause easy bruising and may make it  difficult to stop bleeding. Because of this: Be very careful when using knives, scissors, or other sharp objects. Use an electric razor instead of a blade. Avoid activities that could cause injury or bruising, and follow instructions about how to prevent falls. Wear a medical alert bracelet or carry a card that lists what medicines you take.  General instructions Take over-the-counter and prescription medicines only as told by your health care provider. Return to your normal activities as told by your health care provider. Ask your health care provider what activities are safe for you. Wear compression stockings if recommended by your health care provider. Keep all follow-up visits as told by your health care provider. This is important.  How is this prevented? To lower your risk of developing this condition again: For 30 or more minutes every day, do an activity that: Involves moving your arms and legs. Increases your heart rate. When traveling for longer than four hours: Exercise your arms and legs every hour. Drink plenty of water. Avoid drinking alcohol. Avoid sitting or lying for a long time without moving your legs. If you have surgery or you are hospitalized, ask about ways to prevent blood clots. These may include taking frequent walks or using anticoagulants. Stay at a healthy weight. If   you are a woman who is older than age 35, avoid unnecessary use of medicines that contain estrogen, such as some birth control pills. Do not use any products that contain nicotine or tobacco, such as cigarettes and e-cigarettes. This is especially important if you take estrogen medicines. If you need help quitting, ask your health care provider.  Contact a health care provider if: You miss a dose of your blood thinner. Your menstrual period is heavier than usual. You have unusual bruising.  Get help right away if: You have: New or increased pain, swelling, or redness in an arm or  leg. Numbness or tingling in an arm or leg. Shortness of breath. Chest pain. A rapid or irregular heartbeat. A severe headache or confusion. A cut that will not stop bleeding. There is blood in your vomit, stool, or urine. You have a serious fall or accident, or you hit your head. You feel light-headed or dizzy. You cough up blood.  These symptoms may represent a serious problem that is an emergency. Do not wait to see if the symptoms will go away. Get medical help right away. Call your local emergency services (911 in the [place] Do not drive yourself to the hospital. Summary Deep vein thrombosis (DVT) is a condition in which a blood clot forms in a deep vein, such as a lower leg, thigh, or arm vein. Symptoms can include swelling, warmth, pain, and redness in your leg or arm. This condition may be treated with a blood thinner (anticoagulant medicine), medicine that is injected to dissolve blood clots,compression stockings, or surgery. If you are prescribed blood thinners, take them exactly as told. This information is not intended to replace advice given to you by your health care provider. Make sure you discuss any questions you have with your health care provider. Document Revised: 10/28/2017 Document Reviewed: 04/15/2017 Elsevier Patient Education  2020 Elsevier Inc.    

## 2021-10-19 NOTE — TOC Benefit Eligibility Note (Signed)
Patient Teacher, English as a foreign language completed.    The patient is currently admitted and upon discharge could be taking Xarelto 20mg  & Eliquis 5mg .  The current 30 day co-pay is $4 for each med.   The patient is insured through [facility] Medicaid.    Sharlette Dense, CPhT Pharmacy Patient Advocate Specialist [place] Patient Advocate Team Direct Number: [phone]   Fax: [phone]

## 2021-10-19 NOTE — Discharge Summary (Addendum)
Physician Discharge Summary  JAYDON AVINA URK:[id number] DOB: September 24, 1991 DOA: 10/11/2021  PCP: Pcp, No  Admit date: 10/11/2021 Discharge date: 10/19/2021  Admitted From: Home  Disposition:  Home   Recommendations for Outpatient Follow-up and new medication changes:  Follow up with Dr. West Bali on 11/06/2021 Patient had one dose of oritavancin on the day of discharge and then will continue with 6 week course of doxycyline.  Anticoagulation with rivaroxaban    Home Health: no   Equipment/Devices: no    Discharge Condition: stable  CODE STATUS: full  Diet recommendation:  regular   Brief/Interim Summary: Mr. Maddocks was admitted to the hospital with the working diagnosis of right neck abscess,  complicated with right internal jugular vein thrombophlebitis and tricuspid valve endocarditis.     30 year old male past medical history for IV drug abuse, seizure disorder, opioid addiction and chronic headaches.  He presented painful neck edema on the right side.  Reported 3-4 days of edema on the right side of his neck, that progressed into his face.  Denied any dyspnea or dysphagia, but positive fever.  On his initial physical examination his blood pressure was 109/77, heart rate 87, respiratory rate 16, oxygen saturation 98%, his lungs were clear to auscultation bilaterally, heart S1-S2, present, rhythmic, soft abdomen, lower extremity edema.  Positive right-sided neck edema.   Sodium 137, potassium 3.9, chloride 97, bicarb 30, glucose 103, BUN 10, creatinine 0.62, white count 9.5, hemoglobin 12.3, hematocrit 37.4, platelets 309. SARS COVID-19 negative.   Soft tissue CT neck with right internal jugular vein thrombophlebitis with extensive surrounding cellulitis/phlegmon and abscess in the right lower neck at the level of thyroid.  Surrounding spread of infection with retropharyngeal and hypopharyngeal edema and overlying sternocleidomastoid myositis.  Multiple chronic linear foraging bodies  in the right lower neck possibly retained needles.  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   EKG 99 bpm, normal axis, normal intervals, sinus rhythm, no significant ST segment T wave changes.   Patient was placed on broad-spectrum antibiotic therapy.   11/13 transferred from [facility] to [facility] for ENT and vascular surgery evaluation.   Initial recommendations for conservative medical management, including full anticoagulation.   11/16 Patient with no significant improvement in neck abscess, follow up neck CT soft tissue with increased sized abscess associated with right thrombosed internal jugular vein, measuring 2.3 x 2,6 cm. 3 retained hypodermic needles in the nearby soft tissues.    11/17 Dr Wilburn Cornelia from ENT performed I&D of deep neck abscess.  11/17 TEE with tricuspid valve with echodensity concerning for vegetation, thickened annulus concerning for abscess.  11/17 CT with no evidence of tricuspid valve annular abscess or right atrioventricular abscess.    Patient clinically improving, blood cultures with no growth, patient has been afebrile and no further leukocytosis. Neck abscess culture positive for MRSA.  Decision was made to give one dose of IV oritavancin and continue with oral antibiotic therapy doxycycline for 6 weeks.   Right-sided neck abscess, MRSA, complicated with right internal jugular vein thrombophlebitis, tricuspid valve vegetation, acute bacterial endocarditis. (No sepsis) Patient was admitted to the medical ward, he was placed on a telemetry monitor. Received broad-spectrum antibiotic therapy with vancomycin, levofloxacin and metronidazole. Patient underwent right neck abscess I&D, culture positive for MRSA.  Patient underwent further work-up with echocardiography showing preserved LV systolic function, poorly visualized highly mobile small mass on the anterior leaflet of the tricuspid valve.  He underwent transesophageal echocardiography showing a 1.05 x 0.7 cm  echodensity on  the ventricular side of the tricuspid valve concerning for vegetation.  The tricuspid valve annular was found to be thickened on echocardiography, and he underwent cardiac CT which showed no evidence of tricuspid valve annular abscess or right atrioventricular groove abscess.   Patient's blood cultures have remained grew no growth, he has been afebrile and no further leukocytosis. Decision was made to give 1 dose of oritavancin and continue antibiotic therapy for 6 weeks with oral doxycycline. Follow-up with infections as an outpatient.  2.  Seizure disorder.  Continue Keppra and topiramate, no active seizures during his hospitalization.  3.  Chronic pain syndrome, substance abuse disorder, tobacco abuse.  Patient has been on buprenorphine. During his hospitalization he received analgesics with hydromorphone and oxycodone. For back pain cyclobenzaprine. Nicotine patch and smoking cessation counseling. He was advised to avoid illegal/drug substances.  4.  Hypokalemia/hyponatremia. Clinically resolved.  Discharge Diagnoses:  Principal Problem:   Neck abscess Active Problems:   Thrombophlebitis of internal jugular vein   Seizure disorder ([place])   Foreign body of neck   Opioid use disorder, moderate, in sustained remission (HCC)   Back pain   Tricuspid valve vegetation    Discharge Instructions   Allergies as of 10/19/2021       Reactions   Amoxicillin Other (See Comments)   Steven-Johnson's Syndrome   Ceclor [cefaclor] Other (See Comments)   Steven-Johnson's Syndrome   Erythromycin Other (See Comments)   Steven-Johnson's Syndrome   Penicillins Other (See Comments)   Steven-Johnson's Syndrome   Septra [sulfamethoxazole-trimethoprim] Other (See Comments)   Steven-Johnson's Syndrome   Tylenol [acetaminophen] Other (See Comments)   Unknown reaction - doctor told pt not to take        Medication List     STOP taking these medications    ibuprofen 200  MG tablet Commonly known as: ADVIL       TAKE these medications    Buprenorphine HCl-Naloxone HCl 8-2 MG Film Place 1 Film under the tongue 2 (two) times daily.   cyclobenzaprine 5 MG tablet Commonly known as: FLEXERIL Take 1 tablet at night as needed for headache/neck pain What changed:  how much to take how to take this when to take this reasons to take this additional instructions   doxycycline 100 MG tablet Commonly known as: VIBRA-TABS Take 1 tablet (100 mg total) by mouth 2 (two) times daily.   levETIRAcetam 500 MG tablet Commonly known as: KEPPRA Take 1 and 1/2 tablets twice a day What changed:  how much to take how to take this when to take this additional instructions   rivaroxaban 20 MG Tabs tablet Commonly known as: XARELTO Take 1 tablet (20 mg total) by mouth daily with supper.   topiramate 100 MG tablet Commonly known as: Topamax Take 1 tablet (100 mg total) by mouth 2 (two) times daily.        Allergies  Allergen Reactions   Amoxicillin Other (See Comments)    Steven-Johnson's Syndrome   Ceclor [Cefaclor] Other (See Comments)    Steven-Johnson's Syndrome   Erythromycin Other (See Comments)    Steven-Johnson's Syndrome   Penicillins Other (See Comments)    Steven-Johnson's Syndrome   Septra [Sulfamethoxazole-Trimethoprim] Other (See Comments)    Steven-Johnson's Syndrome   Tylenol [Acetaminophen] Other (See Comments)    Unknown reaction - doctor told pt not to take    Consultations: ID ENT Cardiology for TEE   Procedures/Studies: CT SOFT TISSUE NECK W CONTRAST  Result Date: 10/15/2021 CLINICAL DATA:  Right-sided neck  pain.  Possible abscess. EXAM: CT NECK WITH CONTRAST TECHNIQUE: Multidetector CT imaging of the neck was performed using the standard protocol following the bolus administration of intravenous contrast. CONTRAST:  72mL OMNIPAQUE IOHEXOL 300 MG/ML  SOLN COMPARISON:  None. FINDINGS: PHARYNX AND LARYNX: The nasopharynx,  oropharynx and larynx are normal. Visible portions of the oral cavity, tongue base and floor of mouth are normal. Normal epiglottis, vallecula and pyriform sinuses. The larynx is normal. No retropharyngeal abscess, effusion or lymphadenopathy. SALIVARY GLANDS: Normal parotid, submandibular and sublingual glands. THYROID: Normal. LYMPH NODES: No enlarged or abnormal density lymph nodes. VASCULAR: Thrombosed internal jugular vein with increased size of associated abscess that now measures 2.3 x 2.6 cm. There are again noted 3 retained hypodermic needles in the nearby soft tissues. LIMITED INTRACRANIAL: Normal. VISUALIZED ORBITS: Normal. MASTOIDS AND VISUALIZED PARANASAL SINUSES: No fluid levels or advanced mucosal thickening. No mastoid effusion. SKELETON: No bony spinal canal stenosis. No lytic or blastic lesions. UPPER CHEST: Clear. OTHER: None. IMPRESSION: 1. Thrombosed right internal jugular vein with increased size of associated abscess that now measures 2.3 x 2.6 cm. 2. Unchanged appearance of 3 retained hypodermic needles in the nearby soft tissues. Electronically Signed   By: Ulyses Jarred M.D.   On: 10/15/2021 00:01   CT Soft Tissue Neck W Contrast  Result Date: 10/11/2021 CLINICAL DATA:  Neck mass, initial workup EXAM: CT NECK WITH CONTRAST TECHNIQUE: Multidetector CT imaging of the neck was performed using the standard protocol following the bolus administration of intravenous contrast. CONTRAST:  61mL OMNIPAQUE IOHEXOL 350 MG/ML SOLN COMPARISON:  CT neck July 22, 2019 FINDINGS: Pharynx and larynx: Retropharyngeal and hypopharyngeal edema. Retropharyngeal edema extends from the craniocervical junction inferiorly to approximately the C5-C6 level, measuring up to 6 mm in thickness. There is some mass effect on the supraglottic airway, which remains patent. Salivary glands: No inflammation, mass, or stone. Thyroid: Normal. Lymph nodes: Mildly prominent right supraclavicular and cervical chain nodes,  probably reactive given low findings. Vascular/soft tissues: Thrombosis of the right internal jugular vein from the C5 level inferiorly. At the level of the thyroid gland there is an adjacent abscess that is likely contiguous with thrombosed internal jugular vein; however, the vein is not well visualized in this region. This abscess is irregular and measures approximately 6 2.3 x 1.8 x 2.6 cm (series 5, image 92; series 9, image 79). There is extensive surrounding edema/soft tissue thickening with asymmetric enlargement of the adjacent sternocleidomastoid, compatible with cellulitis/phlegmon and myositis. Also, in this vicinity there are 3 linear foreign bodies, probably needles from IV drug use (annotated on series 5, images 83, 95, and 98). These needles are chronic and present on prior CT neck 2020. The left internal jugular vein is not well visualized. Limited intracranial: Negative. Visualized orbits: Negative. Mastoids and visualized paranasal sinuses: Mild paranasal sinus mucosal thickening. No mastoid effusions. Skeleton: No evidence of acute abnormality. Upper chest: Evaluated on concurrent CT chest. IMPRESSION: 1. Findings compatible with right internal jugular vein thrombophlebitis with extensive surrounding cellulitis/phlegmon and abscess in the right lower neck at the level of the thyroid, as detailed above. Surrounding spread of infection with retropharyngeal and hypopharyngeal edema and overlying sternocleidomastoid myositis. 2. Multiple chronic linear foreign bodies in the right lower neck, probably retained needles from IV drug use. 3. The left internal jugular vein is not well visualized, potentially chronically thrombosed or postsurgical given findings on prior CT neck. Electronically Signed   By: Margaretha Sheffield M.D.   On: 10/11/2021 16:24  CT Angio Chest PE W and/or Wo Contrast  Result Date: 10/11/2021 CLINICAL DATA:  IV drug use; PE suspected, high prob History of IJ thrombus, concern  for recurrence but also concern for pulmonary embolism or SVC syndrome. EXAM: CT ANGIOGRAPHY CHEST WITH CONTRAST TECHNIQUE: Multidetector CT imaging of the chest was performed using the standard protocol during bolus administration of intravenous contrast. Multiplanar CT image reconstructions and MIPs were obtained to evaluate the vascular anatomy. CONTRAST:  51mL OMNIPAQUE IOHEXOL 350 MG/ML SOLN COMPARISON:  July 24, 2019. FINDINGS: Cardiovascular: Satisfactory opacification of the pulmonary arteries to the segmental level. No evidence of pulmonary embolism. Normal heart size. No pericardial effusion. Mediastinum/Nodes: There are several linear metallic densities overlapping the RIGHT neck likely reflecting retained needle fragments from IV drug use (series 6, image 26; series 6, image 30; series 6, image 39).; Please see separately dictated report regarding neck findings. Soft tissue the anterior mediastinum likely reflecting residual thymus. No axillary adenopathy. Mildly prominent RIGHT paratracheal lymph node measures 8 mm in the short axis and is likely reactive in etiology (series 4, image 54). Lungs/Pleura: Bibasilar atelectasis. No pleural effusion or pneumothorax. Upper Abdomen: No acute abnormality. Musculoskeletal: No acute osseous abnormality. Review of the MIP images confirms the above findings. IMPRESSION: 1.  No acute pulmonary embolism 2. There are several linear metallic densities overlapping the RIGHT neck likely reflecting retained needle fragments from IV drug use; Please see separately dictated report regarding neck findings. Electronically Signed   By: Valentino Saxon M.D.   On: 10/11/2021 16:11   CT CORONARY MORPH W/CTA COR W/SCORE W/CA W/CM &/OR WO/CM  Addendum Date: 10/16/2021   ADDENDUM REPORT: 10/16/2021 19:39 HISTORY: Bacteremia, query tricuspid annular abnormality EXAM: Cardiac/Coronary  CT TECHNIQUE: The patient was scanned on a [employer]. PROTOCOL: A 120 kV  prospective scan was triggered in the descending thoracic aorta at 111 HU's. Axial non-contrast 3 mm slices were carried out through the heart. The data set was analyzed on a dedicated work station and scored using the Agatston method. Gantry rotation speed was 250 msecs and collimation was .6 mm. Beta blockade and 0.8 mg of sl NTG was given. The 3D data set was reconstructed in 5% intervals of the 35-75 % of the R-R cycle. Systolic and diastolic phases were analyzed on a dedicated work station using MPR, MIP and VRT modes. The patient received 11mL OMNIPAQUE IOHEXOL 350 MG/ML SOLN contrast. FINDINGS: Image quality: Good Noise artifact is: Limited Tricuspid valve: Grossly normal appearing. There is epicardial fat in the right AV groove. No evidence of air. There is no heterogenous soft tissue attenuation to suggest abscess. No pericardial fluid. Mitral valve: Grossly normal. Coronary calcium score is 0. Coronary arteries: Normal coronary origins. Right dominance. Study not protocoled for evaluation of coronary arteries however LMCA appears free of disease as well as the proximal LAD. Aorta: Normal size, 27 mm at the mid ascending aorta (level of the PA bifurcation) measured double oblique. No calcifications. No dissection. Aortic Valve: No calcifications. Tricuspid aortic valve, normal appearing. Other findings: Normal pulmonary vein drainage into the left atrium. Normal left atrial appendage without thrombus. Normal size of the pulmonary artery. IMPRESSION: 1. No evidence of tricuspid valve annular abscess or right atrioventricular groove abscess Electronically Signed   By: Cherlynn Kaiser M.D.   On: 10/16/2021 19:39   Result Date: 10/16/2021 EXAM: OVER-READ INTERPRETATION  CT CHEST The following report is an over-read performed by radiologist Dr. Vinnie Langton of [facility], [place] on 10/16/2021.  This over-read does not include interpretation of cardiac or coronary anatomy or pathology. The coronary  calcium score/coronary CTA interpretation by the cardiologist is attached. COMPARISON:  None. FINDINGS: Within the visualized portions of the thorax there are no suspicious appearing pulmonary nodules or masses, there is no acute consolidative airspace disease, no pleural effusions, no pneumothorax and no lymphadenopathy. Visualized portions of the upper abdomen are unremarkable. There are no aggressive appearing lytic or blastic lesions noted in the visualized portions of the skeleton. IMPRESSION: 1. No significant incidental noncardiac findings are noted. Electronically Signed: By: Vinnie Langton M.D. On: 10/16/2021 15:28   ECHOCARDIOGRAM COMPLETE  Result Date: 10/12/2021    ECHOCARDIOGRAM REPORT   Patient Name:   BARRET ESQUIVEL Date of Exam: 10/12/2021 Medical Rec #:  [id number]      Height:       66.0 in Accession #:    [id number]     Weight:       170.0 lb Date of Birth:  [birth year]      BSA:          1.866 m Patient Age:    29 years       BP:           103/66 mmHg Patient Gender: M              HR:           69 bpm. Exam Location:  Inpatient Procedure: 2D Echo, Color Doppler and Cardiac Doppler Indications:    Endocarditis I38  History:        Patient has prior history of Echocardiogram examinations, most                 recent 07/24/2019.  Sonographer:    Bernadene Person RDCS Referring Phys: [id number] [place]  1. Left ventricular ejection fraction, by estimation, is 55 to 60%. The left ventricle has normal function. The left ventricle has no regional wall motion abnormalities. Left ventricular diastolic parameters are indeterminate.  2. Right ventricular systolic function is normal. The right ventricular size is normal. Tricuspid regurgitation signal is inadequate for assessing PA pressure.  3. The mitral valve is normal in structure. No evidence of mitral valve regurgitation. No evidence of mitral stenosis.  4. There is a poorly visualized highly mobile small mass (frame 35, 99) on the  anterior leaflet of the tricuspid valve. Recommend TEE for clarification, given the current clinical setting a vegetation can not ruled out.  5. The aortic valve has an indeterminant number of cusps. Aortic valve regurgitation is not visualized. No aortic stenosis is present.  6. The inferior vena cava is normal in size with greater than 50% respiratory variability, suggesting right atrial pressure of 3 mmHg. Conclusion(s)/Recommendation(s): Findings concerning for vegetation on the anterior leaflet of the tricuspid valve, would recommend Transesophageal Echocardiogram for clarification. FINDINGS  Left Ventricle: Left ventricular ejection fraction, by estimation, is 55 to 60%. The left ventricle has normal function. The left ventricle has no regional wall motion abnormalities. The left ventricular internal cavity size was normal in size. There is  no left ventricular hypertrophy. Left ventricular diastolic parameters are indeterminate. Right Ventricle: The right ventricular size is normal. No increase in right ventricular wall thickness. Right ventricular systolic function is normal. Tricuspid regurgitation signal is inadequate for assessing PA pressure. Left Atrium: Left atrial size was normal in size. Right Atrium: Right atrial size was normal in size. Pericardium: There is no evidence of pericardial effusion. Mitral Valve:  The mitral valve is normal in structure. No evidence of mitral valve regurgitation. No evidence of mitral valve stenosis. Tricuspid Valve: There is a poorly visualized highly mobile small mass (frame 35, 99) on the anterior leaflet of the tricuspid valve. The tricuspid valve is abnormal. Tricuspid valve regurgitation is trivial. No evidence of tricuspid stenosis. Aortic Valve: The aortic valve has an indeterminant number of cusps. Aortic valve regurgitation is not visualized. No aortic stenosis is present. Pulmonic Valve: The pulmonic valve was normal in structure. Pulmonic valve regurgitation  is not visualized. No evidence of pulmonic stenosis. Aorta: The aortic root is normal in size and structure. Venous: The inferior vena cava is normal in size with greater than 50% respiratory variability, suggesting right atrial pressure of 3 mmHg. IAS/Shunts: No atrial level shunt detected by color flow Doppler.  LEFT VENTRICLE PLAX 2D LVIDd:         4.70 cm LVIDs:         3.40 cm LV PW:         0.80 cm LV IVS:        0.70 cm LVOT diam:     2.00 cm LV SV:         66 LV SV Index:   35 LVOT Area:     3.14 cm  LV Volumes (MOD) LV vol d, MOD A2C: 123.0 ml LV vol d, MOD A4C: 119.0 ml LV vol s, MOD A2C: 42.0 ml LV vol s, MOD A4C: 63.0 ml LV SV MOD A2C:     81.0 ml LV SV MOD A4C:     119.0 ml LV SV MOD BP:      67.0 ml RIGHT VENTRICLE RV S prime:     13.10 cm/s TAPSE (M-mode): 2.4 cm LEFT ATRIUM             Index        RIGHT ATRIUM           Index LA diam:        3.10 cm 1.66 cm/m   RA Area:     15.70 cm LA Vol (A2C):   36.3 ml 19.45 ml/m  RA Volume:   40.60 ml  21.75 ml/m LA Vol (A4C):   32.8 ml 17.57 ml/m LA Biplane Vol: 35.8 ml 19.18 ml/m  AORTIC VALVE LVOT Vmax:   98.20 cm/s LVOT Vmean:  63.200 cm/s LVOT VTI:    0.209 m  AORTA Ao Root diam: 3.00 cm Ao Asc diam:  3.10 cm MITRAL VALVE MV Area (PHT): 2.87 cm    SHUNTS MV Decel Time: 264 msec    Systemic VTI:  0.21 m MV E velocity: 66.30 cm/s  Systemic Diam: 2.00 cm MV A velocity: 40.70 cm/s MV E/A ratio:  1.63 Kardie Tobb DO Electronically signed by Berniece Salines DO Signature Date/Time: 10/12/2021/5:09:37 PM    Final    ECHO TEE  Result Date: 10/15/2021    TRANSESOPHOGEAL ECHO REPORT   Patient Name:   OVIDE DUSEK Date of Exam: 10/15/2021 Medical Rec #:  [id number]      Height:       66.0 in Accession #:    [id number]     Weight:       170.0 lb Date of Birth:  1991-05-04      BSA:          1.866 m Patient Age:    29 years       BP:  105/60 mmHg Patient Gender: M              HR:           83 bpm. Exam Location:  Inpatient Procedure: Transesophageal  Echo, Cardiac Doppler and Color Doppler Indications:     Endocarditis  History:         Patient has prior history of Echocardiogram examinations, most                  recent 10/12/2021.  Sonographer:     Bernadene Person RDCS Referring Phys:  [id number] Tami Lin [facility] Diagnosing Phys: Skeet Latch MD PROCEDURE: After discussion of the risks and benefits of a TEE, an informed consent was obtained from the patient. The transesophogeal probe was passed without difficulty through the esophogus of the patient. Local oropharyngeal anesthetic was provided with Cetacaine. Sedation performed by different physician. The patient was monitored while under deep sedation. Anesthestetic sedation was provided intravenously by Anesthesiology: 362.37mg  of Propofol. The patient's vital signs; including heart rate, blood pressure, and oxygen saturation; remained stable throughout the procedure. The patient developed no complications during the procedure. IMPRESSIONS  1. Cannot rule out tricuspid valve annular abscess. Recommend gated cardiac CT-A or MRI to better assess.  2. Left ventricular ejection fraction, by estimation, is 55 to 60%. The left ventricle has normal function. The left ventricle has no regional wall motion abnormalities.  3. Right ventricular systolic function is normal. The right ventricular size is normal.  4. No left atrial/left atrial appendage thrombus was detected.  5. The mitral valve is normal in structure. No evidence of mitral valve regurgitation. No evidence of mitral stenosis.  6. The tricuspid valve leaflets are normal. Howerer, the annulus appears thickened, raising concern for annular abscess. There is also a contiguous 1.05 x 0.7 cm echodensity on the ventricular side concerning for vegetation.  7. The aortic valve is tricuspid. Aortic valve regurgitation is not visualized. No aortic stenosis is present.  8. The inferior vena cava is normal in size with greater than 50% respiratory variability,  suggesting right atrial pressure of 3 mmHg. Conclusion(s)/Recommendation(s): Normal biventricular function without evidence of hemodynamically significant valvular heart disease. FINDINGS  Left Ventricle: Left ventricular ejection fraction, by estimation, is 55 to 60%. The left ventricle has normal function. The left ventricle has no regional wall motion abnormalities. The left ventricular internal cavity size was normal in size. There is  no left ventricular hypertrophy. Right Ventricle: The right ventricular size is normal. No increase in right ventricular wall thickness. Right ventricular systolic function is normal. Left Atrium: Left atrial size was normal in size. No left atrial/left atrial appendage thrombus was detected. Right Atrium: Right atrial size was normal in size. Pericardium: There is no evidence of pericardial effusion. Mitral Valve: The mitral valve is normal in structure. No evidence of mitral valve regurgitation. No evidence of mitral valve stenosis. Tricuspid Valve: The tricuspid valve leaflets are normal. Howerer, the annulus appears thickened, raising concern for annular abscess. There is also a contiguous 1.05 x 0.7 cm echodensity on the ventricular side concerning for vegetation. The tricuspid valve is normal in structure. Tricuspid valve regurgitation is trivial. No evidence of tricuspid stenosis. Aortic Valve: The aortic valve is tricuspid. Aortic valve regurgitation is not visualized. No aortic stenosis is present. Pulmonic Valve: The pulmonic valve was normal in structure. Pulmonic valve regurgitation is not visualized. No evidence of pulmonic stenosis. Aorta: The aortic root is normal in size and structure. Venous: The inferior vena  cava is normal in size with greater than 50% respiratory variability, suggesting right atrial pressure of 3 mmHg. IAS/Shunts: No atrial level shunt detected by color flow Doppler. Skeet Latch MD Electronically signed by Skeet Latch MD Signature  Date/Time: 10/15/2021/3:22:37 PM    Final        Subjective: Patient is feeling better, no nausea or vomiting, neck pain has improved, no chest pain or dyspnea   Discharge Exam: Vitals:   10/19/21 0435 10/19/21 0856  BP: 107/74 107/72  Pulse: 76 83  Resp: 18 18  Temp: 98.4 F (36.9 C) 98 F (36.7 C)  SpO2: 100% 100%   Vitals:   10/18/21 1935 10/18/21 2316 10/19/21 0435 10/19/21 0856  BP: 114/85 105/73 107/74 107/72  Pulse: 97 87 76 83  Resp:  18 18 18   Temp: 98 F (36.7 C) 97.7 F (36.5 C) 98.4 F (36.9 C) 98 F (36.7 C)  TempSrc: Oral Oral Oral Oral  SpO2: 99% 100% 100% 100%  Weight:      Height:        General: Not in pain or dyspnea  Neurology: Awake and alert, non focal  E ENT: no pallor, no icterus, oral mucosa moist, neck wound with dressing in place.  Cardiovascular: No JVD. S1-S2 present, rhythmic, no gallops, rubs, or murmurs. No lower extremity edema. Pulmonary: positive breath sounds bilaterally, with, no wheezing, rhonchi or rales. Gastrointestinal. Abdomen soft and non tender Skin. No rashes Musculoskeletal: no joint deformities   The results of significant diagnostics from this hospitalization (including imaging, microbiology, ancillary and laboratory) are listed below for reference.     Microbiology: Recent Results (from the past 240 hour(s))  Blood culture (routine x 2)     Status: None   Collection Time: 10/11/21  2:15 PM   Specimen: BLOOD  Result Value Ref Range Status   Specimen Description   Final    BLOOD RIGHT ANTECUBITAL Performed at [facility] [address], [place], [place] [address]    Special Requests   Final    BOTTLES DRAWN AEROBIC AND ANAEROBIC Blood Culture adequate volume Performed at [facility] [address] [place], [place] [address]    Culture   Final    NO GROWTH 5 DAYS Performed at [facility], [place] [address] [place], [place] [address]    Report Status  10/16/2021 FINAL  Final  Resp Panel by RT-PCR (Flu A&B, Covid)     Status: None   Collection Time: 10/11/21  2:26 PM   Specimen: Nasopharyngeal(NP) swabs in vial transport medium  Result Value Ref Range Status   SARS Coronavirus 2 by RT PCR NEGATIVE NEGATIVE Final    Comment: (NOTE) SARS-CoV-2 target nucleic acids are NOT DETECTED.  The SARS-CoV-2 RNA is generally detectable in upper respiratory specimens during the acute phase of infection. The lowest concentration of SARS-CoV-2 viral copies this assay can detect is 138 copies/mL. A negative result does not preclude SARS-Cov-2 infection and should not be used as the sole basis for treatment or other patient management decisions. A negative result may occur with  improper specimen collection/handling, submission of specimen other than nasopharyngeal swab, presence of viral mutation(s) within the areas targeted by this assay, and inadequate number of viral copies(<138 copies/mL). A negative result must be combined with clinical observations, patient history, and epidemiological information. The expected result is Negative.  Fact Sheet for Patients:  EntrepreneurPulse.com.au  Fact Sheet for Healthcare Providers:  IncredibleEmployment.be  This test is  no t yet approved or cleared by the [place] and  has been authorized for detection and/or diagnosis of SARS-CoV-2 by FDA under an Emergency Use Authorization (EUA). This EUA will remain  in effect (meaning this test can be used) for the duration of the COVID-19 declaration under Section 564(b)(1) of the Act, 21 U.S.C.section 360bbb-3(b)(1), unless the authorization is terminated  or revoked sooner.       Influenza A by PCR NEGATIVE NEGATIVE Final   Influenza B by PCR NEGATIVE NEGATIVE Final    Comment: (NOTE) The Xpert Xpress SARS-CoV-2/FLU/RSV plus assay is intended as an aid in the diagnosis of influenza from Nasopharyngeal swab  specimens and should not be used as a sole basis for treatment. Nasal washings and aspirates are unacceptable for Xpert Xpress SARS-CoV-2/FLU/RSV testing.  Fact Sheet for Patients: EntrepreneurPulse.com.au  Fact Sheet for Healthcare Providers: IncredibleEmployment.be  This test is not yet approved or cleared by the [place] FDA and has been authorized for detection and/or diagnosis of SARS-CoV-2 by FDA under an Emergency Use Authorization (EUA). This EUA will remain in effect (meaning this test can be used) for the duration of the COVID-19 declaration under Section 564(b)(1) of the Act, 21 U.S.C. section 360bbb-3(b)(1), unless the authorization is terminated or revoked.  Performed at [facility], [place] [address], [place], [place] [address]   Culture, blood (Routine X 2) w Reflex to ID Panel     Status: None   Collection Time: 10/11/21  8:20 PM   Specimen: BLOOD  Result Value Ref Range Status   Specimen Description   Final    BLOOD LEFT ANTECUBITAL Performed at [facility] [address], [place], [place] [address]    Special Requests   Final    BOTTLES DRAWN AEROBIC AND ANAEROBIC Blood Culture adequate volume Performed at [facility] [address] [place], [place] [address]    Culture   Final    NO GROWTH 5 DAYS Performed at [facility], [place] [address], [place], [place] [address]    Report Status 10/17/2021 FINAL  Final  MRSA Next Gen by PCR, Nasal     Status: None   Collection Time: 10/12/21 12:07 PM   Specimen: Nasal Mucosa; Nasal Swab  Result Value Ref Range Status   MRSA by PCR Next Gen NOT DETECTED NOT DETECTED Final    Comment: (NOTE) The GeneXpert MRSA Assay (FDA approved for NASAL specimens only), is one component of a comprehensive MRSA colonization surveillance program. It is not intended to diagnose MRSA infection nor to guide or monitor treatment  for MRSA infections. Test performance is not FDA approved in patients less than 69 years old. Performed at [facility], [place] [address] [place], [place] [address]   Aerobic/Anaerobic Culture w Gram Stain (surgical/deep wound)     Status: None (Preliminary result)   Collection Time: 10/15/21  6:20 PM   Specimen: PATH Other; ENT  Result Value Ref Range Status   Specimen Description ABSCESS NECK  Final   Special Requests PT ON LEVAQUIN,FLAGYL, VANC  Final   Gram Stain   Final    NO SQUAMOUS EPITHELIAL CELLS SEEN FEW WBC SEEN FEW GRAM POSITIVE COCCI Performed at [facility], [place] [address], [place], [place] [address]    Culture   Final    MODERATE METHICILLIN RESISTANT STAPHYLOCOCCUS AUREUS NO ANAEROBES ISOLATED; CULTURE IN PROGRESS FOR 5 DAYS    Report Status PENDING  Incomplete  Organism ID, Bacteria METHICILLIN RESISTANT STAPHYLOCOCCUS AUREUS  Final      Susceptibility   Methicillin resistant staphylococcus aureus - MIC*    CIPROFLOXACIN >=8 RESISTANT Resistant     ERYTHROMYCIN >=8 RESISTANT Resistant     GENTAMICIN <=0.5 SENSITIVE Sensitive     OXACILLIN >=4 RESISTANT Resistant     TETRACYCLINE <=1 SENSITIVE Sensitive     VANCOMYCIN <=0.5 SENSITIVE Sensitive     TRIMETH/SULFA >=320 RESISTANT Resistant     CLINDAMYCIN >=8 RESISTANT Resistant     RIFAMPIN <=0.5 SENSITIVE Sensitive     Inducible Clindamycin NEGATIVE Sensitive     * MODERATE METHICILLIN RESISTANT STAPHYLOCOCCUS AUREUS     Labs: BNP (last 3 results) No results for input(s): BNP in the last 8760 hours. Basic Metabolic Panel: Recent Labs  Lab 10/13/21 0139 10/15/21 0105 10/16/21 0212 10/18/21 1006  NA 135 134* 134* 137  K 3.8 3.1* 4.2 4.1  CL 107 104 106 104  CO2 19* 21* 21* 25  GLUCOSE 208* 111* 173* 81  BUN 18 12 8 12   CREATININE 0.86 0.78 0.66 0.74  CALCIUM 9.0 8.1* 8.9 8.8*   Liver Function Tests: No results for input(s): AST, ALT, ALKPHOS, BILITOT, PROT, ALBUMIN in the last 168  hours. No results for input(s): LIPASE, AMYLASE in the last 168 hours. No results for input(s): AMMONIA in the last 168 hours. CBC: Recent Labs  Lab 10/14/21 0134 10/15/21 0105 10/16/21 0212 10/17/21 0113 10/19/21 0308  WBC 10.1 6.8 8.4 7.4 6.0  HGB 12.0* 11.8* 11.6* 11.4* 12.3*  HCT 36.4* 35.9* 34.0* 33.7* 36.5*  MCV 86.3 86.3 83.3 84.0 84.9  PLT 262 219 297 249 304   Cardiac Enzymes: No results for input(s): CKTOTAL, CKMB, CKMBINDEX, TROPONINI in the last 168 hours. BNP: Invalid input(s): POCBNP CBG: No results for input(s): GLUCAP in the last 168 hours. D-Dimer No results for input(s): DDIMER in the last 72 hours. Hgb A1c No results for input(s): HGBA1C in the last 72 hours. Lipid Profile No results for input(s): CHOL, HDL, LDLCALC, TRIG, CHOLHDL, LDLDIRECT in the last 72 hours. Thyroid function studies No results for input(s): TSH, T4TOTAL, T3FREE, THYROIDAB in the last 72 hours.  Invalid input(s): FREET3 Anemia work up No results for input(s): VITAMINB12, FOLATE, FERRITIN, TIBC, IRON, RETICCTPCT in the last 72 hours. Urinalysis    Component Value Date/Time   COLORURINE AMBER (A) 10/16/2014 1955   APPEARANCEUR CLEAR 10/16/2014 1955   LABSPEC 1.023 10/16/2014 1955   PHURINE 7.5 10/16/2014 1955   GLUCOSEU NEGATIVE 10/16/2014 1955   HGBUR NEGATIVE 10/16/2014 1955   BILIRUBINUR NEGATIVE 10/16/2014 1955   KETONESUR NEGATIVE 10/16/2014 1955   PROTEINUR NEGATIVE 10/16/2014 1955   UROBILINOGEN 4.0 (H) 10/16/2014 1955   NITRITE NEGATIVE 10/16/2014 1955   LEUKOCYTESUR NEGATIVE 10/16/2014 1955   Sepsis Labs Invalid input(s): PROCALCITONIN,  WBC,  LACTICIDVEN Microbiology Recent Results (from the past 240 hour(s))  Blood culture (routine x 2)     Status: None   Collection Time: 10/11/21  2:15 PM   Specimen: BLOOD  Result Value Ref Range Status   Specimen Description   Final    BLOOD RIGHT ANTECUBITAL Performed at [facility], [place] [address] [place], [place] [address]    Special Requests   Final    BOTTLES DRAWN AEROBIC AND ANAEROBIC Blood Culture adequate volume Performed at [facility] [address], [place], [place] [address]    Culture   Final    NO GROWTH 5 DAYS  Performed at [facility], [place] [address], [place], [place] [address]    Report Status 10/16/2021 FINAL  Final  Resp Panel by RT-PCR (Flu A&B, Covid)     Status: None   Collection Time: 10/11/21  2:26 PM   Specimen: Nasopharyngeal(NP) swabs in vial transport medium  Result Value Ref Range Status   SARS Coronavirus 2 by RT PCR NEGATIVE NEGATIVE Final    Comment: (NOTE) SARS-CoV-2 target nucleic acids are NOT DETECTED.  The SARS-CoV-2 RNA is generally detectable in upper respiratory specimens during the acute phase of infection. The lowest concentration of SARS-CoV-2 viral copies this assay can detect is 138 copies/mL. A negative result does not preclude SARS-Cov-2 infection and should not be used as the sole basis for treatment or other patient management decisions. A negative result may occur with  improper specimen collection/handling, submission of specimen other than nasopharyngeal swab, presence of viral mutation(s) within the areas targeted by this assay, and inadequate number of viral copies(<138 copies/mL). A negative result must be combined with clinical observations, patient history, and epidemiological information. The expected result is Negative.  Fact Sheet for Patients:  EntrepreneurPulse.com.au  Fact Sheet for Healthcare Providers:  IncredibleEmployment.be  This test is no t yet approved or cleared by the [place] FDA and  has been authorized for detection and/or diagnosis of SARS-CoV-2 by FDA under an Emergency Use Authorization (EUA). This EUA will remain  in effect (meaning this test can be used) for the duration of the COVID-19 declaration under Section 564(b)(1)  of the Act, 21 U.S.C.section 360bbb-3(b)(1), unless the authorization is terminated  or revoked sooner.       Influenza A by PCR NEGATIVE NEGATIVE Final   Influenza B by PCR NEGATIVE NEGATIVE Final    Comment: (NOTE) The Xpert Xpress SARS-CoV-2/FLU/RSV plus assay is intended as an aid in the diagnosis of influenza from Nasopharyngeal swab specimens and should not be used as a sole basis for treatment. Nasal washings and aspirates are unacceptable for Xpert Xpress SARS-CoV-2/FLU/RSV testing.  Fact Sheet for Patients: EntrepreneurPulse.com.au  Fact Sheet for Healthcare Providers: IncredibleEmployment.be  This test is not yet approved or cleared by the [place] FDA and has been authorized for detection and/or diagnosis of SARS-CoV-2 by FDA under an Emergency Use Authorization (EUA). This EUA will remain in effect (meaning this test can be used) for the duration of the COVID-19 declaration under Section 564(b)(1) of the Act, 21 U.S.C. section 360bbb-3(b)(1), unless the authorization is terminated or revoked.  Performed at [facility], [place] [address] [place], [place] [address]   Culture, blood (Routine X 2) w Reflex to ID Panel     Status: None   Collection Time: 10/11/21  8:20 PM   Specimen: BLOOD  Result Value Ref Range Status   Specimen Description   Final    BLOOD LEFT ANTECUBITAL Performed at [facility] [address] [place], [place] [address]    Special Requests   Final    BOTTLES DRAWN AEROBIC AND ANAEROBIC Blood Culture adequate volume Performed at [facility] [address] [place], [place] [address]    Culture   Final    NO GROWTH 5 DAYS Performed at [facility], [place] [address], [place],  [address]    Report Status 10/17/2021 FINAL  Final  MRSA Next Gen by PCR, Nasal     Status: None   Collection Time: 10/12/21 12:07 PM   Specimen: Nasal  Mucosa;  Nasal Swab  Result Value Ref Range Status   MRSA by PCR Next Gen NOT DETECTED NOT DETECTED Final    Comment: (NOTE) The GeneXpert MRSA Assay (FDA approved for NASAL specimens only), is one component of a comprehensive MRSA colonization surveillance program. It is not intended to diagnose MRSA infection nor to guide or monitor treatment for MRSA infections. Test performance is not FDA approved in patients less than 64 years old. Performed at [facility], [place] [address] [place], [place] [address]   Aerobic/Anaerobic Culture w Gram Stain (surgical/deep wound)     Status: None (Preliminary result)   Collection Time: 10/15/21  6:20 PM   Specimen: PATH Other; ENT  Result Value Ref Range Status   Specimen Description ABSCESS NECK  Final   Special Requests PT ON LEVAQUIN,FLAGYL, VANC  Final   Gram Stain   Final    NO SQUAMOUS EPITHELIAL CELLS SEEN FEW WBC SEEN FEW GRAM POSITIVE COCCI Performed at [facility], [place] [address], [place],  [address]    Culture   Final    MODERATE METHICILLIN RESISTANT STAPHYLOCOCCUS AUREUS NO ANAEROBES ISOLATED; CULTURE IN PROGRESS FOR 5 DAYS    Report Status PENDING  Incomplete   Organism ID, Bacteria METHICILLIN RESISTANT STAPHYLOCOCCUS AUREUS  Final      Susceptibility   Methicillin resistant staphylococcus aureus - MIC*    CIPROFLOXACIN >=8 RESISTANT Resistant     ERYTHROMYCIN >=8 RESISTANT Resistant     GENTAMICIN <=0.5 SENSITIVE Sensitive     OXACILLIN >=4 RESISTANT Resistant     TETRACYCLINE <=1 SENSITIVE Sensitive     VANCOMYCIN <=0.5 SENSITIVE Sensitive     TRIMETH/SULFA >=320 RESISTANT Resistant     CLINDAMYCIN >=8 RESISTANT Resistant     RIFAMPIN <=0.5 SENSITIVE Sensitive     Inducible Clindamycin NEGATIVE Sensitive     * MODERATE METHICILLIN RESISTANT STAPHYLOCOCCUS AUREUS     Time coordinating discharge: 45 minutes  SIGNED:   Tawni Millers, MD  [facility] 10/19/2021, 11:16 AM

## 2021-10-19 NOTE — Progress Notes (Signed)
   ENT Progress Note: POD #4 s/p Procedure(s): INCISION AND DRAINAGE ABSCESS   Subjective: Patient reports significant improvement in symptoms of neck pain  Objective: Vital signs in last 24 hours: Temp:  [97.7 F (36.5 C)-98.6 F (37 C)] 98 F (36.7 C) (11/21 0856) Pulse Rate:  [76-99] 83 (11/21 0856) Resp:  [18] 18 (11/21 0856) BP: (105-119)/(69-85) 107/72 (11/21 0856) SpO2:  [99 %-100 %] 100 % (11/21 0856) Weight change:  Last BM Date: 10/17/21  Intake/Output from previous day: 11/20 0701 - 11/21 0700 In: 1100 [P.O.:600; IV Piggyback:500] Out: -  Intake/Output this shift: No intake/output data recorded.  Labs: Recent Labs    10/17/21 0113 10/19/21 0308  WBC 7.4 6.0  HGB 11.4* 12.3*  HCT 33.7* 36.5*  PLT 249 304   Recent Labs    10/18/21 1006  NA 137  K 4.1  CL 104  CO2 25  GLUCOSE 81  BUN 12  CALCIUM 8.8*    Studies/Results: No results found.   PHYSICAL EXAM: Minimal discharge from the drain site, JP drain removed without difficulty.  No skin erythema or other evidence of active infection.   Assessment/Plan: The patient is stable after incision and drainage of right deep neck abscess.  Culture showed MRSA, patient on appropriate medical therapy per infectious disease.  He has had a significant clinical improvement.  He had findings on CT scan of multiple metallic foreign bodies in the soft tissue of the neck consistent with previous injection into the internal jugular vein.  Given the patient's clinical findings I do not feel that neck exploration would be warranted.  Recommend continued clinical observation with follow-up in my office in 2 weeks for recheck.  Monitor for recurrent infection complete antibiotic therapy as prescribed.  Again emphasized the need to avoid any illicit drugs or intravenous injection.    Jerrell Belfast 10/19/2021, 11:59 AM

## 2021-10-19 NOTE — Progress Notes (Signed)
ANTICOAGULATION CONSULT NOTE - Initial Consult  Pharmacy Consult for Rivaroxaban Indication: RIJ thrombus  Allergies  Allergen Reactions   Amoxicillin Other (See Comments)    Steven-Johnson's Syndrome   Ceclor [Cefaclor] Other (See Comments)    Steven-Johnson's Syndrome   Erythromycin Other (See Comments)    Steven-Johnson's Syndrome   Penicillins Other (See Comments)    Steven-Johnson's Syndrome   Septra [Sulfamethoxazole-Trimethoprim] Other (See Comments)    Steven-Johnson's Syndrome   Tylenol [Acetaminophen] Other (See Comments)    Unknown reaction - doctor told pt not to take    Patient Measurements: Height: 5\' 6"  (167.6 cm) Weight: 80.3 kg (177 lb 0.5 oz) IBW/kg (Calculated) : 63.8 .  Vital Signs: Temp: 98 F (36.7 C) (11/21 0856) Temp Source: Oral (11/21 0856) BP: 107/72 (11/21 0856) Pulse Rate: 83 (11/21 0856)  Labs: Recent Labs    10/17/21 0113 10/18/21 1006 10/19/21 0308  HGB 11.4*  --  12.3*  HCT 33.7*  --  36.5*  PLT 249  --  304  CREATININE  --  0.74  --     Estimated Creatinine Clearance: 133.2 mL/min (by C-G formula based on SCr of 0.74 mg/dL).   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 Patient received therapeutic heparin and enoxaparin 11/14 to 11/21. No anticoagulation prior to admission. Patient would like to leave today, pharmacy consulted to switch enoxaparin to rivaroxaban. Last dose enoxaparin 11/21 @ 9 AM.   Per rivaroxaban dosing, should have 21 days of 15mg  BID. Patient received 10 days of therapeutic anticoagulation. Discussed with MD, given risks and benefits and concern for adherence, agreed with starting rivaroxaban 20mg  daily.    Goal of  Therapy:  Monitor platelets by anticoagulation protocol: Yes   Plan:  Rivaroxaban 20 mg daily tonight with dinner  Monitor for signs/symptoms of bleeding    Benetta Spar, PharmD, BCPS, BCCP Clinical Pharmacist  Please check AMION for all [facility] phone numbers After 10:00 PM, call [facility] [phone]

## 2021-10-20 ENCOUNTER — Other Ambulatory Visit (HOSPITAL_COMMUNITY): Payer: Self-pay

## 2021-10-20 ENCOUNTER — Encounter: Payer: Self-pay | Admitting: Neurology

## 2021-10-20 LAB — AEROBIC/ANAEROBIC CULTURE W GRAM STAIN (SURGICAL/DEEP WOUND): Gram Stain: NONE SEEN

## 2021-10-30 ENCOUNTER — Telehealth (HOSPITAL_COMMUNITY): Payer: Self-pay

## 2021-10-30 ENCOUNTER — Other Ambulatory Visit (HOSPITAL_COMMUNITY): Payer: Self-pay

## 2021-10-30 NOTE — Telephone Encounter (Signed)
Attempted to contact Mr. Matranga for a follow up [facility] conversation. No Answer.

## 2021-11-06 ENCOUNTER — Ambulatory Visit: Payer: Medicaid Other | Admitting: Infectious Diseases

## 2021-11-06 ENCOUNTER — Other Ambulatory Visit: Payer: Self-pay

## 2021-11-06 ENCOUNTER — Encounter: Payer: Self-pay | Admitting: Infectious Diseases

## 2021-11-06 VITALS — BP 132/77 | HR 76 | Temp 97.7°F | Wt 160.0 lb

## 2021-11-06 DIAGNOSIS — F1990 Other psychoactive substance use, unspecified, uncomplicated: Secondary | ICD-10-CM

## 2021-11-06 DIAGNOSIS — Z23 Encounter for immunization: Secondary | ICD-10-CM

## 2021-11-06 DIAGNOSIS — Z5181 Encounter for therapeutic drug level monitoring: Secondary | ICD-10-CM | POA: Diagnosis not present

## 2021-11-06 DIAGNOSIS — I079 Rheumatic tricuspid valve disease, unspecified: Secondary | ICD-10-CM

## 2021-11-06 DIAGNOSIS — I808 Phlebitis and thrombophlebitis of other sites: Secondary | ICD-10-CM

## 2021-11-06 DIAGNOSIS — F17200 Nicotine dependence, unspecified, uncomplicated: Secondary | ICD-10-CM

## 2021-11-06 NOTE — Progress Notes (Addendum)
[facility]                                                             [place], [place], [address]                                                                  Phn. [phone]; Fax: [phone]                                                                             Date: 11/06/21  Reason for Referral: HFU for septic thrombophlebitis   Assessment Rt IJ septic thrombophlebitis with Rt  lower neck abscess  - s/p  I and D on 11/18. 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 Mother is asking about if it can be removed Labs today Fu in 3 weeks before end date of abtx after CT neck  Orders Placed This Encounter  Procedures   CT SOFT TISSUE NECK W CONTRAST   CBC   Basic metabolic panel   C-reactive protein   Sedimentation rate   All questions and concerns were discussed and addressed. Patient verbalized understanding of the plan. ____________________________________________________________________________________________________________________  HPI/Interval events 11/06/21 Patient here for HFU in the setting of R IJ septic thrombophlebitis with Rt lower neck abscess. Gated CTA negative for TV annular abscess or rt AV groove abscess. Patient was on IV vancomycin/PO Levofloxacin and Metronidazole while in the hospital. He received one dose of IV oritavancin on the day of discharge 11/21with 6 weeks of PO Doxycycline after OR cx11/17  grew MRSA. He is taking PO doxycycline and PO   Eliquis as instructed.Taking doxycycline twice daily. Denies fevers, chills and sweats. Denies nausea, vomiting  and diarrhea. Denies any pain/tenderness/swelling at the neck. Denies SOB and dysphagia. Stays with his mother and looking forward to starting work with his brother. Tells last use of IVD was longtime ago.   ROS: Constitutional: Negative for fever, chills, activity change, appetite change, fatigue and unexpected weight change.  Respiratory: Negative for cough, shortness of breath Cardiovascular: Negative for chest pain, palpitations and leg swelling.  Gastrointestinal: Negative for nausea, vomiting, abdominal pain, diarrhea/constipation, .  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   Seizure [facility])    Past Surgical History:  Procedure Laterality Date   INCISION AND DRAINAGE ABSCESS Left 07/25/2019   Procedure: INCISION AND DRAINAGE NECK ABSCESS;  Surgeon: Jerrell Belfast, MD;  Location: [facility];  Service: ENT;  Laterality: Left;   INCISION AND DRAINAGE ABSCESS Right 10/15/2021   Procedure: INCISION AND DRAINAGE ABSCESS;  Surgeon: Jerrell Belfast, MD;  Location: [facility];  Service: ENT;  Laterality: Right;   TEE WITHOUT CARDIOVERSION N/A 10/15/2021   Procedure: TRANSESOPHAGEAL ECHOCARDIOGRAM (TEE);  Surgeon: Skeet Latch, MD;  Location: [facility];  Service: Cardiovascular;  Laterality: N/A;   TYMPANOSTOMY TUBE PLACEMENT     [age] years old     Allergies  Allergen Reactions   Amoxicillin Other (See Comments)    Steven-Johnson's Syndrome   Ceclor [Cefaclor] Other (See Comments)    Steven-Johnson's Syndrome   Erythromycin Other (See Comments)    Steven-Johnson's Syndrome   Penicillins  Other (See Comments)    Steven-Johnson's Syndrome   Septra [Sulfamethoxazole-Trimethoprim] Other (See Comments)    Steven-Johnson's Syndrome   Tylenol [Acetaminophen] Other (See Comments)    Unknown reaction - doctor told pt not to take   Social History   Socioeconomic History   Marital status: Married    Spouse name: Not on file   Number of children: 1   Years of education: Not on file   Highest education level: Not on file  Occupational History   Not on file  Tobacco Use   Smoking status: Every Day    Packs/day: 0.00    Years: 5.00    Pack years: 0.00    Types: Cigarettes   Smokeless tobacco: Never  Vaping Use   Vaping Use: Never used  Substance and Sexual Activity   Alcohol use: No   Drug use: Not Currently    Types: Marijuana    Comment: Opiod addiction   Sexual activity: Yes    Partners: Female  Other Topics Concern   Not on file  Social History Narrative   Lives in [place].    Trying to find work.       Right handed      12th grade      1 son has a daughter       Lives with grandmother and his son lives with them      Social Determinants of Health   Financial Resource Strain: Not on file  Food Insecurity: Not on file  Transportation Needs: Not on file  Physical Activity: Not on file  Stress: Not on file  Social Connections: Not on file  Intimate Partner Violence: Not on file    Vitals BP 132/77 (BP Location: Right Arm)   Pulse 76   Temp 97.7 F (36.5 C) (Oral)   Wt 160 lb (72.6 kg)   BMI 25.82 kg/m   Examination  General - not in acute distress, comfortably sitting in chair HEENT - no pallor and no icterus    Chest - b/l clear air entry, no additional sounds CVS- Normal s1s2, RRR Abdomen - Soft, Non tender , non distended Ext- no pedal edema Neuro: grossly non focal; awake, alert and oriented  Psych : calm and cooperative   Recent labs CBC Latest Ref Rng & Units 10/19/2021 10/17/2021 10/16/2021  WBC 4.0 - 10.5 K/uL 6.0 7.4  8.4  Hemoglobin 13.0 - 17.0 g/dL 12.3(L) 11.4(L)  11.6(L)  Hematocrit 39.0 - 52.0 % 36.5(L) 33.7(L) 34.0(L)  Platelets 150 - 400 K/uL 304 249 297   CMP Latest Ref Rng & Units 10/18/2021 10/16/2021 10/15/2021  Glucose 70 - 99 mg/dL 81 173(H) 111(H)  BUN 6 - 20 mg/dL 12 8 12   Creatinine 0.61 - 1.24 mg/dL 0.74 0.66 0.78  Sodium 135 - 145 mmol/L 137 134(L) 134(L)  Potassium 3.5 - 5.1 mmol/L 4.1 4.2 3.1(L)  Chloride 98 - 111 mmol/L 104 106 104  CO2 22 - 32 mmol/L 25 21(L) 21(L)  Calcium 8.9 - 10.3 mg/dL 8.8(L) 8.9 8.1(L)  Total Protein 6.5 - 8.1 g/dL - - -  Total Bilirubin 0.3 - 1.2 mg/dL - - -  Alkaline Phos 38 - 126 U/L - - -  AST 15 - 41 U/L - - -  ALT 0 - 44 U/L - - -   Pertinent Microbiology Results for orders placed or performed during the hospital encounter of 10/11/21  Blood culture (routine x 2)     Status: None   Collection Time: 10/11/21  2:15 PM   Specimen: BLOOD  Result Value Ref Range Status   Specimen Description   Final    BLOOD RIGHT ANTECUBITAL Performed at [facility] [address], [place], [place] [address]    Special Requests   Final    BOTTLES DRAWN AEROBIC AND ANAEROBIC Blood Culture adequate volume Performed at [facility] [address], [place], [place] [address]    Culture   Final    NO GROWTH 5 DAYS Performed at [facility], [place] [address] [place], [place] [address]    Report Status 10/16/2021 FINAL  Final  Resp Panel by RT-PCR (Flu A&B, Covid)     Status: None   Collection Time: 10/11/21  2:26 PM   Specimen: Nasopharyngeal(NP) swabs in vial transport medium  Result Value Ref Range Status   SARS Coronavirus 2 by RT PCR NEGATIVE NEGATIVE Final    Comment: (NOTE) SARS-CoV-2 target nucleic acids are NOT DETECTED.  The SARS-CoV-2 RNA is generally detectable in upper respiratory specimens during the acute phase of infection. The lowest concentration of SARS-CoV-2 viral copies this assay can detect  is 138 copies/mL. A negative result does not preclude SARS-Cov-2 infection and should not be used as the sole basis for treatment or other patient management decisions. A negative result may occur with  improper specimen collection/handling, submission of specimen other than nasopharyngeal swab, presence of viral mutation(s) within the areas targeted by this assay, and inadequate number of viral copies(<138 copies/mL). A negative result must be combined with clinical observations, patient history, and epidemiological information. The expected result is Negative.  Fact Sheet for Patients:  EntrepreneurPulse.com.au  Fact Sheet for Healthcare Providers:  IncredibleEmployment.be  This test is no t yet approved or cleared by the [place] FDA and  has been authorized for detection and/or diagnosis of SARS-CoV-2 by FDA under an Emergency Use Authorization (EUA). This EUA will remain  in effect (meaning this test can be used) for the duration of the COVID-19 declaration under Section 564(b)(1) of the Act, 21 U.S.C.section 360bbb-3(b)(1), unless the authorization is terminated  or revoked sooner.       Influenza A by PCR NEGATIVE NEGATIVE Final   Influenza B by PCR NEGATIVE NEGATIVE Final    Comment: (NOTE) The Xpert Xpress SARS-CoV-2/FLU/RSV plus assay is intended as an aid in the diagnosis of influenza from Nasopharyngeal swab specimens and should not be used  as a sole basis for treatment. Nasal washings and aspirates are unacceptable for Xpert Xpress SARS-CoV-2/FLU/RSV testing.  Fact Sheet for Patients: EntrepreneurPulse.com.au  Fact Sheet for Healthcare Providers: IncredibleEmployment.be  This test is not yet approved or cleared by the [place] FDA and has been authorized for detection and/or diagnosis of SARS-CoV-2 by FDA under an Emergency Use Authorization (EUA). This EUA will remain in effect  (meaning this test can be used) for the duration of the COVID-19 declaration under Section 564(b)(1) of the Act, 21 U.S.C. section 360bbb-3(b)(1), unless the authorization is terminated or revoked.  Performed at [facility], [place] [address], [place], [place] [address]   Culture, blood (Routine X 2) w Reflex to ID Panel     Status: None   Collection Time: 10/11/21  8:20 PM   Specimen: BLOOD  Result Value Ref Range Status   Specimen Description   Final    BLOOD LEFT ANTECUBITAL Performed at [facility] [address] [place], [place] [address]    Special Requests   Final    BOTTLES DRAWN AEROBIC AND ANAEROBIC Blood Culture adequate volume Performed at [facility] [address] [place], [place] [address]    Culture   Final    NO GROWTH 5 DAYS Performed at [facility], [place] [address] [place], [place] [address]    Report Status 10/17/2021 FINAL  Final  MRSA Next Gen by PCR, Nasal     Status: None   Collection Time: 10/12/21 12:07 PM   Specimen: Nasal Mucosa; Nasal Swab  Result Value Ref Range Status   MRSA by PCR Next Gen NOT DETECTED NOT DETECTED Final    Comment: (NOTE) The GeneXpert MRSA Assay (FDA approved for NASAL specimens only), is one component of a comprehensive MRSA colonization surveillance program. It is not intended to diagnose MRSA infection nor to guide or monitor treatment for MRSA infections. Test performance is not FDA approved in patients less than 50 years old. Performed at [facility], [place] [address] [place], [place] [address]   Aerobic/Anaerobic Culture w Gram Stain (surgical/deep wound)     Status: None   Collection Time: 10/15/21  6:20 PM   Specimen: PATH Other; ENT  Result Value Ref Range Status   Specimen Description ABSCESS NECK  Final   Special Requests PT ON LEVAQUIN,FLAGYL, VANC  Final   Gram Stain   Final    NO SQUAMOUS EPITHELIAL CELLS SEEN FEW WBC SEEN FEW GRAM  POSITIVE COCCI    Culture   Final    MODERATE METHICILLIN RESISTANT STAPHYLOCOCCUS AUREUS NO ANAEROBES ISOLATED Performed at [facility], [place] [address], [place], [place] [address]    Report Status 10/20/2021 FINAL  Final   Organism ID, Bacteria METHICILLIN RESISTANT STAPHYLOCOCCUS AUREUS  Final      Susceptibility   Methicillin resistant staphylococcus aureus - MIC*    CIPROFLOXACIN >=8 RESISTANT Resistant     ERYTHROMYCIN >=8 RESISTANT Resistant     GENTAMICIN <=0.5 SENSITIVE Sensitive     OXACILLIN >=4 RESISTANT Resistant     TETRACYCLINE <=1 SENSITIVE Sensitive     VANCOMYCIN <=0.5 SENSITIVE Sensitive     TRIMETH/SULFA >=320 RESISTANT Resistant     CLINDAMYCIN >=8 RESISTANT Resistant     RIFAMPIN <=0.5 SENSITIVE Sensitive     Inducible Clindamycin NEGATIVE Sensitive     * MODERATE METHICILLIN RESISTANT STAPHYLOCOCCUS AUREUS    Pertinent Imaging CT SOFT TISSUE NECK W CONTRAST  Result  Date: 10/15/2021 CLINICAL DATA:  Right-sided neck pain.  Possible abscess. EXAM: CT NECK WITH CONTRAST TECHNIQUE: Multidetector CT imaging of the neck was performed using the standard protocol following the bolus administration of intravenous contrast. CONTRAST:  47mL OMNIPAQUE IOHEXOL 300 MG/ML  SOLN COMPARISON:  None. FINDINGS: PHARYNX AND LARYNX: The nasopharynx, oropharynx and larynx are normal. Visible portions of the oral cavity, tongue base and floor of mouth are normal. Normal epiglottis, vallecula and pyriform sinuses. The larynx is normal. No retropharyngeal abscess, effusion or lymphadenopathy. SALIVARY GLANDS: Normal parotid, submandibular and sublingual glands. THYROID: Normal. LYMPH NODES: No enlarged or abnormal density lymph nodes. VASCULAR: Thrombosed internal jugular vein with increased size of associated abscess that now measures 2.3 x 2.6 cm. There are again noted 3 retained hypodermic needles in the nearby soft tissues. LIMITED INTRACRANIAL: Normal. VISUALIZED ORBITS: Normal.  MASTOIDS AND VISUALIZED PARANASAL SINUSES: No fluid levels or advanced mucosal thickening. No mastoid effusion. SKELETON: No bony spinal canal stenosis. No lytic or blastic lesions. UPPER CHEST: Clear. OTHER: None. IMPRESSION: 1. Thrombosed right internal jugular vein with increased size of associated abscess that now measures 2.3 x 2.6 cm. 2. Unchanged appearance of 3 retained hypodermic needles in the nearby soft tissues. Electronically Signed   By: Ulyses Jarred M.D.   On: 10/15/2021 00:01   CT Soft Tissue Neck W Contrast  Result Date: 10/11/2021 CLINICAL DATA:  Neck mass, initial workup EXAM: CT NECK WITH CONTRAST TECHNIQUE: Multidetector CT imaging of the neck was performed using the standard protocol following the bolus administration of intravenous contrast. CONTRAST:  58mL OMNIPAQUE IOHEXOL 350 MG/ML SOLN COMPARISON:  CT neck July 22, 2019 FINDINGS: Pharynx and larynx: Retropharyngeal and hypopharyngeal edema. Retropharyngeal edema extends from the craniocervical junction inferiorly to approximately the C5-C6 level, measuring up to 6 mm in thickness. There is some mass effect on the supraglottic airway, which remains patent. Salivary glands: No inflammation, mass, or stone. Thyroid: Normal. Lymph nodes: Mildly prominent right supraclavicular and cervical chain nodes, probably reactive given low findings. Vascular/soft tissues: Thrombosis of the right internal jugular vein from the C5 level inferiorly. At the level of the thyroid gland there is an adjacent abscess that is likely contiguous with thrombosed internal jugular vein; however, the vein is not well visualized in this region. This abscess is irregular and measures approximately 6 2.3 x 1.8 x 2.6 cm (series 5, image 92; series 9, image 79). There is extensive surrounding edema/soft tissue thickening with asymmetric enlargement of the adjacent sternocleidomastoid, compatible with cellulitis/phlegmon and myositis. Also, in this vicinity there are  3 linear foreign bodies, probably needles from IV drug use (annotated on series 5, images 83, 95, and 98). These needles are chronic and present on prior CT neck 2020. The left internal jugular vein is not well visualized. Limited intracranial: Negative. Visualized orbits: Negative. Mastoids and visualized paranasal sinuses: Mild paranasal sinus mucosal thickening. No mastoid effusions. Skeleton: No evidence of acute abnormality. Upper chest: Evaluated on concurrent CT chest. IMPRESSION: 1. Findings compatible with right internal jugular vein thrombophlebitis with extensive surrounding cellulitis/phlegmon and abscess in the right lower neck at the level of the thyroid, as detailed above. Surrounding spread of infection with retropharyngeal and hypopharyngeal edema and overlying sternocleidomastoid myositis. 2. Multiple chronic linear foreign bodies in the right lower neck, probably retained needles from IV drug use. 3. The left internal jugular vein is not well visualized, potentially chronically thrombosed or postsurgical given findings on prior CT neck. Electronically Signed   By: Roslynn Amble  Ronnald Ramp M.D.   On: 10/11/2021 16:24   CT Angio Chest PE W and/or Wo Contrast  Result Date: 10/11/2021 CLINICAL DATA:  IV drug use; PE suspected, high prob History of IJ thrombus, concern for recurrence but also concern for pulmonary embolism or SVC syndrome. EXAM: CT ANGIOGRAPHY CHEST WITH CONTRAST TECHNIQUE: Multidetector CT imaging of the chest was performed using the standard protocol during bolus administration of intravenous contrast. Multiplanar CT image reconstructions and MIPs were obtained to evaluate the vascular anatomy. CONTRAST:  9mL OMNIPAQUE IOHEXOL 350 MG/ML SOLN COMPARISON:  July 24, 2019. FINDINGS: Cardiovascular: Satisfactory opacification of the pulmonary arteries to the segmental level. No evidence of pulmonary embolism. Normal heart size. No pericardial effusion. Mediastinum/Nodes: There are  several linear metallic densities overlapping the RIGHT neck likely reflecting retained needle fragments from IV drug use (series 6, image 26; series 6, image 30; series 6, image 39).; Please see separately dictated report regarding neck findings. Soft tissue the anterior mediastinum likely reflecting residual thymus. No axillary adenopathy. Mildly prominent RIGHT paratracheal lymph node measures 8 mm in the short axis and is likely reactive in etiology (series 4, image 54). Lungs/Pleura: Bibasilar atelectasis. No pleural effusion or pneumothorax. Upper Abdomen: No acute abnormality. Musculoskeletal: No acute osseous abnormality. Review of the MIP images confirms the above findings. IMPRESSION: 1.  No acute pulmonary embolism 2. There are several linear metallic densities overlapping the RIGHT neck likely reflecting retained needle fragments from IV drug use; Please see separately dictated report regarding neck findings. Electronically Signed   By: Valentino Saxon M.D.   On: 10/11/2021 16:11   CT CORONARY MORPH W/CTA COR W/SCORE W/CA W/CM &/OR WO/CM  Addendum Date: 10/16/2021   ADDENDUM REPORT: 10/16/2021 19:39 HISTORY: Bacteremia, query tricuspid annular abnormality EXAM: Cardiac/Coronary  CT TECHNIQUE: The patient was scanned on a [employer]. PROTOCOL: A 120 kV prospective scan was triggered in the descending thoracic aorta at 111 HU's. Axial non-contrast 3 mm slices were carried out through the heart. The data set was analyzed on a dedicated work station and scored using the Agatston method. Gantry rotation speed was 250 msecs and collimation was .6 mm. Beta blockade and 0.8 mg of sl NTG was given. The 3D data set was reconstructed in 5% intervals of the 35-75 % of the R-R cycle. Systolic and diastolic phases were analyzed on a dedicated work station using MPR, MIP and VRT modes. The patient received 194mL OMNIPAQUE IOHEXOL 350 MG/ML SOLN contrast. FINDINGS: Image quality: Good Noise artifact  is: Limited Tricuspid valve: Grossly normal appearing. There is epicardial fat in the right AV groove. No evidence of air. There is no heterogenous soft tissue attenuation to suggest abscess. No pericardial fluid. Mitral valve: Grossly normal. Coronary calcium score is 0. Coronary arteries: Normal coronary origins. Right dominance. Study not protocoled for evaluation of coronary arteries however LMCA appears free of disease as well as the proximal LAD. Aorta: Normal size, 27 mm at the mid ascending aorta (level of the PA bifurcation) measured double oblique. No calcifications. No dissection. Aortic Valve: No calcifications. Tricuspid aortic valve, normal appearing. Other findings: Normal pulmonary vein drainage into the left atrium. Normal left atrial appendage without thrombus. Normal size of the pulmonary artery. IMPRESSION: 1. No evidence of tricuspid valve annular abscess or right atrioventricular groove abscess Electronically Signed   By: Cherlynn Kaiser M.D.   On: 10/16/2021 19:39   Result Date: 10/16/2021 EXAM: OVER-READ INTERPRETATION  CT CHEST The following report is an over-read performed by radiologist  Dr. Vinnie Langton of [facility], [place] on 10/16/2021. This over-read does not include interpretation of cardiac or coronary anatomy or pathology. The coronary calcium score/coronary CTA interpretation by the cardiologist is attached. COMPARISON:  None. FINDINGS: Within the visualized portions of the thorax there are no suspicious appearing pulmonary nodules or masses, there is no acute consolidative airspace disease, no pleural effusions, no pneumothorax and no lymphadenopathy. Visualized portions of the upper abdomen are unremarkable. There are no aggressive appearing lytic or blastic lesions noted in the visualized portions of the skeleton. IMPRESSION: 1. No significant incidental noncardiac findings are noted. Electronically Signed: By: Vinnie Langton M.D. On: 10/16/2021 15:28    ECHOCARDIOGRAM COMPLETE  Result Date: 10/12/2021    ECHOCARDIOGRAM REPORT   Patient Name:   [name] Date of Exam: 10/12/2021 Medical Rec #:  [id number]      Height:       66.0 in Accession #:    [id number]     Weight:       170.0 lb Date of Birth:  February 25, 1991      BSA:          1.866 m Patient Age:    29 years       BP:           103/66 mmHg Patient Gender: M              HR:           69 bpm. Exam Location:  Inpatient Procedure: 2D Echo, Color Doppler and Cardiac Doppler Indications:    Endocarditis I38  History:        Patient has prior history of Echocardiogram examinations, most                 recent 07/24/2019.  Sonographer:    Bernadene Person RDCS Referring Phys: [id number] [place]  1. Left ventricular ejection fraction, by estimation, is 55 to 60%. The left ventricle has normal function. The left ventricle has no regional wall motion abnormalities. Left ventricular diastolic parameters are indeterminate.  2. Right ventricular systolic function is normal. The right ventricular size is normal. Tricuspid regurgitation signal is inadequate for assessing PA pressure.  3. The mitral valve is normal in structure. No evidence of mitral valve regurgitation. No evidence of mitral stenosis.  4. There is a poorly visualized highly mobile small mass (frame 35, 99) on the anterior leaflet of the tricuspid valve. Recommend TEE for clarification, given the current clinical setting a vegetation can not ruled out.  5. The aortic valve has an indeterminant number of cusps. Aortic valve regurgitation is not visualized. No aortic stenosis is present.  6. The inferior vena cava is normal in size with greater than 50% respiratory variability, suggesting right atrial pressure of 3 mmHg. Conclusion(s)/Recommendation(s): Findings concerning for vegetation on the anterior leaflet of the tricuspid valve, would recommend Transesophageal Echocardiogram for clarification. FINDINGS  Left Ventricle: Left  ventricular ejection fraction, by estimation, is 55 to 60%. The left ventricle has normal function. The left ventricle has no regional wall motion abnormalities. The left ventricular internal cavity size was normal in size. There is  no left ventricular hypertrophy. Left ventricular diastolic parameters are indeterminate. Right Ventricle: The right ventricular size is normal. No increase in right ventricular wall thickness. Right ventricular systolic function is normal. Tricuspid regurgitation signal is inadequate for assessing PA pressure. Left Atrium: Left atrial size was normal in size. Right Atrium: Right atrial size was normal in size. Pericardium:  There is no evidence of pericardial effusion. Mitral Valve: The mitral valve is normal in structure. No evidence of mitral valve regurgitation. No evidence of mitral valve stenosis. Tricuspid Valve: There is a poorly visualized highly mobile small mass (frame 35, 99) on the anterior leaflet of the tricuspid valve. The tricuspid valve is abnormal. Tricuspid valve regurgitation is trivial. No evidence of tricuspid stenosis. Aortic Valve: The aortic valve has an indeterminant number of cusps. Aortic valve regurgitation is not visualized. No aortic stenosis is present. Pulmonic Valve: The pulmonic valve was normal in structure. Pulmonic valve regurgitation is not visualized. No evidence of pulmonic stenosis. Aorta: The aortic root is normal in size and structure. Venous: The inferior vena cava is normal in size with greater than 50% respiratory variability, suggesting right atrial pressure of 3 mmHg. IAS/Shunts: No atrial level shunt detected by color flow Doppler.  LEFT VENTRICLE PLAX 2D LVIDd:         4.70 cm LVIDs:         3.40 cm LV PW:         0.80 cm LV IVS:        0.70 cm LVOT diam:     2.00 cm LV SV:         66 LV SV Index:   35 LVOT Area:     3.14 cm  LV Volumes (MOD) LV vol d, MOD A2C: 123.0 ml LV vol d, MOD A4C: 119.0 ml LV vol s, MOD A2C: 42.0 ml LV vol s,  MOD A4C: 63.0 ml LV SV MOD A2C:     81.0 ml LV SV MOD A4C:     119.0 ml LV SV MOD BP:      67.0 ml RIGHT VENTRICLE RV S prime:     13.10 cm/s TAPSE (M-mode): 2.4 cm LEFT ATRIUM             Index        RIGHT ATRIUM           Index LA diam:        3.10 cm 1.66 cm/m   RA Area:     15.70 cm LA Vol (A2C):   36.3 ml 19.45 ml/m  RA Volume:   40.60 ml  21.75 ml/m LA Vol (A4C):   32.8 ml 17.57 ml/m LA Biplane Vol: 35.8 ml 19.18 ml/m  AORTIC VALVE LVOT Vmax:   98.20 cm/s LVOT Vmean:  63.200 cm/s LVOT VTI:    0.209 m  AORTA Ao Root diam: 3.00 cm Ao Asc diam:  3.10 cm MITRAL VALVE MV Area (PHT): 2.87 cm    SHUNTS MV Decel Time: 264 msec    Systemic VTI:  0.21 m MV E velocity: 66.30 cm/s  Systemic Diam: 2.00 cm MV A velocity: 40.70 cm/s MV E/A ratio:  1.63 Kardie Tobb DO Electronically signed by Berniece Salines DO Signature Date/Time: 10/12/2021/5:09:37 PM    Final    ECHO TEE  Result Date: 10/15/2021    TRANSESOPHOGEAL ECHO REPORT   Patient Name:   [name] Date of Exam: 10/15/2021 Medical Rec #:  [id number]      Height:       66.0 in Accession #:    [id number]     Weight:       170.0 lb Date of Birth:  [birth year]      BSA:          1.866 m Patient Age:    30 years  BP:           105/60 mmHg Patient Gender: M              HR:           83 bpm. Exam Location:  Inpatient Procedure: Transesophageal Echo, Cardiac Doppler and Color Doppler Indications:     Endocarditis  History:         Patient has prior history of Echocardiogram examinations, most                  recent 10/12/2021.  Sonographer:     Bernadene Person RDCS Referring Phys:  [id number] Tami Lin [facility] Diagnosing Phys: Skeet Latch MD PROCEDURE: After discussion of the risks and benefits of a TEE, an informed consent was obtained from the patient. The transesophogeal probe was passed without difficulty through the esophogus of the patient. Local oropharyngeal anesthetic was provided with Cetacaine. Sedation performed by different physician. The  patient was monitored while under deep sedation. Anesthestetic sedation was provided intravenously by Anesthesiology: 362.37mg  of Propofol. The patient's vital signs; including heart rate, blood pressure, and oxygen saturation; remained stable throughout the procedure. The patient developed no complications during the procedure. IMPRESSIONS  1. Cannot rule out tricuspid valve annular abscess. Recommend gated cardiac CT-A or MRI to better assess.  2. Left ventricular ejection fraction, by estimation, is 55 to 60%. The left ventricle has normal function. The left ventricle has no regional wall motion abnormalities.  3. Right ventricular systolic function is normal. The right ventricular size is normal.  4. No left atrial/left atrial appendage thrombus was detected.  5. The mitral valve is normal in structure. No evidence of mitral valve regurgitation. No evidence of mitral stenosis.  6. The tricuspid valve leaflets are normal. Howerer, the annulus appears thickened, raising concern for annular abscess. There is also a contiguous 1.05 x 0.7 cm echodensity on the ventricular side concerning for vegetation.  7. The aortic valve is tricuspid. Aortic valve regurgitation is not visualized. No aortic stenosis is present.  8. The inferior vena cava is normal in size with greater than 50% respiratory variability, suggesting right atrial pressure of 3 mmHg. Conclusion(s)/Recommendation(s): Normal biventricular function without evidence of hemodynamically significant valvular heart disease. FINDINGS  Left Ventricle: Left ventricular ejection fraction, by estimation, is 55 to 60%. The left ventricle has normal function. The left ventricle has no regional wall motion abnormalities. The left ventricular internal cavity size was normal in size. There is  no left ventricular hypertrophy. Right Ventricle: The right ventricular size is normal. No increase in right ventricular wall thickness. Right ventricular systolic function is  normal. Left Atrium: Left atrial size was normal in size. No left atrial/left atrial appendage thrombus was detected. Right Atrium: Right atrial size was normal in size. Pericardium: There is no evidence of pericardial effusion. Mitral Valve: The mitral valve is normal in structure. No evidence of mitral valve regurgitation. No evidence of mitral valve stenosis. Tricuspid Valve: The tricuspid valve leaflets are normal. Howerer, the annulus appears thickened, raising concern for annular abscess. There is also a contiguous 1.05 x 0.7 cm echodensity on the ventricular side concerning for vegetation. The tricuspid valve is normal in structure. Tricuspid valve regurgitation is trivial. No evidence of tricuspid stenosis. Aortic Valve: The aortic valve is tricuspid. Aortic valve regurgitation is not visualized. No aortic stenosis is present. Pulmonic Valve: The pulmonic valve was normal in structure. Pulmonic valve regurgitation is not visualized. No evidence of pulmonic stenosis. Aorta: The aortic  root is normal in size and structure. Venous: The inferior vena cava is normal in size with greater than 50% respiratory variability, suggesting right atrial pressure of 3 mmHg. IAS/Shunts: No atrial level shunt detected by color flow Doppler. Skeet Latch MD Electronically signed by Skeet Latch MD Signature Date/Time: 10/15/2021/3:22:37 PM    Final      All pertinent labs/Imagings/notes reviewed. All pertinent plain films and CT images have been personally visualized and interpreted; radiology reports have been reviewed. Decision making incorporated into the Impression / Recommendations.  I have spent a total of 40  minutes of face-to-face and non-face-to-face time, excluding clinical staff time, preparing to see patient, ordering tests and/or medications, and provide counseling the patient    Electronically signed by:  Rosiland Oz, MD Infectious Disease Physician [facility] [address] [place], [place] [address] Phone: [phone]  Fax: [phone]

## 2021-11-07 LAB — C-REACTIVE PROTEIN: CRP: 1.1 mg/L (ref <8.0)

## 2021-11-07 LAB — CBC
HCT: 42.1 % (ref 38.5–50.0)
Hemoglobin: 13.9 g/dL (ref 13.2–17.1)
MCH: 28.9 pg (ref 27.0–33.0)
MCHC: 33 g/dL (ref 32.0–36.0)
MCV: 87.5 fL (ref 80.0–100.0)
MPV: 8.7 fL (ref 7.5–12.5)
Platelets: 295 10*3/uL (ref 140–400)
RBC: 4.81 10*6/uL (ref 4.20–5.80)
RDW: 13 % (ref 11.0–15.0)
WBC: 4.6 10*3/uL (ref 3.8–10.8)

## 2021-11-07 LAB — BASIC METABOLIC PANEL
BUN: 12 mg/dL (ref 7–25)
CO2: 29 mmol/L (ref 20–32)
Calcium: 9.7 mg/dL (ref 8.6–10.3)
Chloride: 101 mmol/L (ref 98–110)
Creat: 0.81 mg/dL (ref 0.60–1.26)
Glucose, Bld: 82 mg/dL (ref 65–99)
Potassium: 4.5 mmol/L (ref 3.5–5.3)
Sodium: 139 mmol/L (ref 135–146)

## 2021-11-07 LAB — SEDIMENTATION RATE: Sed Rate: 6 mm/h (ref 0–15)

## 2021-11-11 ENCOUNTER — Ambulatory Visit (HOSPITAL_BASED_OUTPATIENT_CLINIC_OR_DEPARTMENT_OTHER)
Admission: RE | Admit: 2021-11-11 | Discharge: 2021-11-11 | Disposition: A | Discharge status: Home / Self Care | Payer: Medicaid Other | Source: Ambulatory Visit | Attending: Infectious Diseases | Admitting: Infectious Diseases

## 2021-11-11 ENCOUNTER — Other Ambulatory Visit: Payer: Self-pay

## 2021-11-11 DIAGNOSIS — I808 Phlebitis and thrombophlebitis of other sites: Secondary | ICD-10-CM | POA: Insufficient documentation

## 2021-11-11 MED ORDER — IOHEXOL 300 MG/ML  SOLN
75.0000 mL | Freq: Once | INTRAMUSCULAR | Status: AC | PRN
  Administered 2021-11-11: 16:00:00 75 mL via INTRAVENOUS

## 2021-11-17 ENCOUNTER — Other Ambulatory Visit (HOSPITAL_COMMUNITY): Payer: Self-pay

## 2021-11-17 ENCOUNTER — Telehealth (HOSPITAL_COMMUNITY): Payer: Self-pay

## 2021-11-17 NOTE — Telephone Encounter (Signed)
Pharmacy Transitions of Care Follow-up Telephone Call  Date of discharge: 10/19/21  Discharge Diagnosis: thrombophlebitis of internal jugular vein  How have you been since you were released from the hospital? Patient has been well, no questions about meds at this time.   Medication changes made at discharge:  - START: Xarelto  Medication changes verified by the patient? Yes    Medication Accessibility:  Home Pharmacy: [facility]   Was the patient provided with refills on discharged medications? Yes   Have all prescriptions been transferred from [facility] to home pharmacy? Yes   Is the patient able to afford medications? Has insurance    Medication Review:  RIVAROXABAN (XARELTO)  Rivaroxaban 20 mg QD initiated on 10/19/21 - Discussed importance of taking medication with food and around the same time everyday  - Advised patient of medications to avoid (NSAIDs, ASA)  - Educated that Tylenol (acetaminophen) will be the preferred analgesic to prevent risk of bleeding  - Emphasized importance of monitoring for signs and symptoms of bleeding (abnormal bruising, prolonged bleeding, nose bleeds, bleeding from gums, discolored urine, black tarry stools)  - Advised patient to alert all providers of anticoagulation therapy prior to starting a new medication or having a procedure   Follow-up Appointments:  [facility] f/u appt confirmed?  Scheduled to see Dr. West Bali on 12/02/21 @ 1:45pm.   If their condition worsens, is the pt aware to call PCP or go to the Emergency Dept.? yes  Final Patient Assessment: Patient has follow up scheduled and refills at home pharmacy

## 2021-12-02 ENCOUNTER — Other Ambulatory Visit: Payer: Self-pay

## 2021-12-02 ENCOUNTER — Ambulatory Visit (INDEPENDENT_AMBULATORY_CARE_PROVIDER_SITE_OTHER): Payer: Medicaid Other | Admitting: Infectious Diseases

## 2021-12-02 VITALS — BP 113/75 | HR 91 | Temp 98.1°F | Wt 160.6 lb

## 2021-12-02 DIAGNOSIS — I808 Phlebitis and thrombophlebitis of other sites: Secondary | ICD-10-CM | POA: Diagnosis present

## 2021-12-02 DIAGNOSIS — I079 Rheumatic tricuspid valve disease, unspecified: Secondary | ICD-10-CM | POA: Diagnosis not present

## 2021-12-02 DIAGNOSIS — F1990 Other psychoactive substance use, unspecified, uncomplicated: Secondary | ICD-10-CM | POA: Diagnosis not present

## 2021-12-02 DIAGNOSIS — Z5181 Encounter for therapeutic drug level monitoring: Secondary | ICD-10-CM | POA: Diagnosis not present

## 2021-12-02 NOTE — Progress Notes (Signed)
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     cyclobenzaprine (FLEXERIL) 5 MG tablet Take 1 tablet at night as needed for headache/neck pain (Patient taking differently: Take 5 mg by mouth at bedtime as needed (headache/neck pain).) 30 tablet 11   levETIRAcetam (KEPPRA) 500 MG tablet Take 1 and 1/2 tablets twice a day (Patient taking differently: Take 750 mg by mouth 2 (two) times daily.) 270 tablet 3   rivaroxaban (XARELTO) 20 MG TABS tablet Take 1 tablet (20 mg total) by mouth daily with supper. 30 tablet 2   topiramate (TOPAMAX) 100 MG tablet Take 1 tablet (100 mg total) by mouth 2 (two) times daily. 180 tablet 3   No current facility-administered medications on file prior to visit.    Subjective: Here for follow up in the setting of RT IJ septic thrombophlebitis with rt neck abscess and possible TV endocarditis in the setting of IVDU. He completed 6 weeks of doxycyline 2 days ago, denies missing doses. Discussed CT neck  findings with patient and mother.   He has also been taking xarelto for clot in his bilateral jugular veins. He had facial swelling and rt hand swelling since he ran out of furosemide in the last few days. However, face swelling and rt hand swelling has gone down significantly per mother. He feels as if his left ear is muffled which has been going on for a month. Also feels he  hears less from the left ear. Denies tinnitus, ringing ears or ear discharge. No issues with the rt ear. Had on and off migrane headache, denies blurry vision and neck pain. He has small veins visible in his neck which he says has been there for a month at least. Denies dysphagia or SOB. No changes in appetite or weight. He was given a course of ciprofloxacin for presumed ear infecton  given complain of muffling in his left ear by his suboxone provider which he finished last week. He is trying to find a PCP due to change in his insurance.   Review of Systems: ROS Denies fevers, chills and sweats Denies nausea, vomiting and diarrhea  All other systems reviewed and were negative.   Past Medical History:  Diagnosis Date   Hepatitis C    History of Stevens-Johnson toxic epidermal necrolysis overlap syndrome    with PCN, not with keflex   Opiate addiction ([place])    on methadone   Seizure [facility])     Social History   Tobacco Use  Smoking status: Every Day    Packs/day: 0.00    Years: 5.00    Pack years: 0.00    Types: Cigarettes   Smokeless tobacco: Never  Vaping Use   Vaping Use: Never used  Substance Use Topics   Alcohol use: No   Drug use: Not Currently    Types: Marijuana    Comment: Opiod addiction    Family History  Problem Relation Age of Onset   Diabetes Other        grandparents   Stroke Other    Pancreatic cancer Other    Diabetes Mellitus II Other     Allergies  Allergen Reactions   Amoxicillin Other (See Comments)    Steven-Johnson's Syndrome   Ceclor [Cefaclor] Other (See Comments)     Steven-Johnson's Syndrome   Erythromycin Other (See Comments)    Steven-Johnson's Syndrome   Penicillins Other (See Comments)    Steven-Johnson's Syndrome   Septra [Sulfamethoxazole-Trimethoprim] Other (See Comments)    Steven-Johnson's Syndrome   Tylenol [Acetaminophen] Other (See Comments)    Unknown reaction - doctor told pt not to take    Health Maintenance  Topic Date Due   COVID-19 Vaccine (1) Never done   Pneumococcal Vaccine 34-44 Years old (2 - PCV) 07/22/2020   INFLUENZA VACCINE  Never done   TETANUS/TDAP  10/16/2024   Hepatitis C Screening  Completed   HIV Screening  Completed   HPV VACCINES  Aged Out    Objective: BP 113/75    Pulse 91    Temp 98.1 F (36.7 C) (Oral)    Wt 160 lb 9.6 oz (72.8 kg)    SpO2 100%    BMI 25.92 kg/m    Physical Exam Constitutional:      Appearance: Normal appearance.  HENT:     Head: Normocephalic and atraumatic.      Mouth: Mucous membranes are moist. Prominent veins in the anterior neck. No swelling/cervical lymphadenopathy . RT and Left external ear/EAC - no visible discharge, redness  Eyes:    Conjunctiva/sclera: Conjunctivae normal.     Pupils: Pupils are equal, round, and reactive to light.   Cardiovascular:     Rate and Rhythm: Normal rate and regular rhythm.     Heart sounds: No murmur heard.   Pulmonary:     Effort: Pulmonary effort is normal.     Breath sounds: Normal breath sounds.   Abdominal:     General: Abdomen is flat.     Palpations: Abdomen is soft.   Musculoskeletal:        General: Normal range of motion. No pedal edema   Skin:    General: Skin is warm and dry.     Comments:  Neurological:     General: No focal deficit present.     Mental Status: awake, alert and oriented to person, place, and time.   Psychiatric:        Mood and Affect: Mood normal.   Lab Results Lab Results  Component Value Date   WBC 4.6 11/06/2021   HGB 13.9 11/06/2021   HCT 42.1 11/06/2021   MCV 87.5 11/06/2021    PLT 295 11/06/2021    Lab Results  Component Value Date   CREATININE 0.81 11/06/2021   BUN 12 11/06/2021   NA 139 11/06/2021   K 4.5 11/06/2021   CL 101 11/06/2021   CO2 29 11/06/2021    Lab Results  Component Value Date   ALT 9 10/11/2021   AST 12 (L) 10/11/2021  ALKPHOS 68 10/11/2021   BILITOT 0.4 10/11/2021    No results found for: CHOL, HDL, LDLCALC, LDLDIRECT, TRIG, CHOLHDL No results found for: LABRPR, RPRTITER No results found for: HIV1RNAQUANT, HIV1RNAVL, CD4TABS  Pertinent Imaging  CT SOFT TISSUE NECK W CONTRAST  Result Date: 11/12/2021 CLINICAL DATA:  Follow-up neck abscess. Right IJ septic thrombophlebitis. Surgical drainage 10/16/2021. History of IV drug use. EXAM: CT NECK WITH CONTRAST TECHNIQUE: Multidetector CT imaging of the neck was performed using the standard protocol following the bolus administration of intravenous contrast. CONTRAST:  70mL OMNIPAQUE IOHEXOL 300 MG/ML  SOLN COMPARISON:  CT neck 10/14/2021, 10/11/2021 FINDINGS: Pharynx and larynx: Normal. No mass or swelling. Salivary glands: No inflammation, mass, or stone. Thyroid: Normal. Lymph nodes: No pathologic lymph nodes in the neck. Small lymph nodes are present in the neck bilaterally. Vascular: Thrombosed right internal jugular vein again noted in the lower neck. The previously noted abscess in this area has resolved. There remains some soft tissue thickening and inflammation in the area but no fluid collection. There are small metal clips or needles in the right lower neck unchanged. Normal carotid enhancement. Left jugular vein appears chronically occluded, unchanged Limited intracranial: Negative Visualized orbits: Negative Mastoids and visualized paranasal sinuses: Minimal mucosal edema paranasal sinuses. Left mastoid effusion. Skeleton: No acute abnormality.  No evidence of spinal infection. Upper chest: Lung apices clear bilaterally. Other: None IMPRESSION: 1. Interval resolution of abscess in the  right lower neck. There remains soft tissue edema and inflammation. 2. Occlusion of the right lower jugular vein unchanged. 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 Electronically Signed   By: Franchot Gallo M.D.   On: 11/12/2021 16:37    Problem List Items Addressed This Visit       Cardiovascular and Mediastinum   Thrombophlebitis of internal jugular vein - Primary   Endocarditis of tricuspid valve     Other   Medication monitoring encounter   Substance use disorder     Assessment/Plan # Rt IJ septic thrombophlebitis with Rt  lower neck abscess  - s/p  I and D on 11/18. OR cx with MRSA  - CT 11/12/21: Interval resolution of rt lower neck abscess, soft tissue edema and inflammation+. 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 Related to bilateral IJ thrombus  - The facial swelling and rt hand swelling has improved from few days ago per mother   # Possible TV endocarditis  # Immunization counseling - counseled for Hep B vaccine  # H/o IVDU: on rehab # Medication monitoring  Completed 6 weeks of tx for thrombophlebitis and possible TV endocarditis  FU with ENT for muffling of voice in left ear/foreign bodies in left neck  Fu with PCP for anticoagulant management  Fu with me as needed   I have personally spent 40 minutes involved in face-to-face and non-face-to-face activities for this patient on the day of the visit. Professional time spent includes the following activities: Preparing to see the patient (review of tests), Obtaining and/or reviewing separately obtained history (admission/discharge record), Performing a medically appropriate examination and/or evaluation , Ordering medications/tests/procedures, referring and communicating with other health care professionals, Documenting clinical information in the EMR, Independently interpreting  results (not separately reported), Communicating results to the patient/family/caregiver, Counseling and educating the patient/family/caregiver and Care coordination (not separately reported).   Wilber Oliphant, [place] [facility] Infectious  D[facility] 12/02/2021, 1:37 PM

## 2022-02-12 ENCOUNTER — Ambulatory Visit: Payer: Medicaid Other | Admitting: Neurology

## 2022-02-16 ENCOUNTER — Ambulatory Visit: Payer: Medicaid Other | Admitting: Neurology

## 2022-05-27 ENCOUNTER — Other Ambulatory Visit: Payer: Self-pay | Admitting: Neurology

## 2022-07-02 ENCOUNTER — Encounter: Payer: Self-pay | Admitting: Neurology

## 2022-07-02 ENCOUNTER — Ambulatory Visit: Payer: Medicaid Other | Admitting: Neurology

## 2022-07-02 VITALS — BP 120/75 | HR 77 | Wt 161.0 lb

## 2022-07-02 DIAGNOSIS — R569 Unspecified convulsions: Secondary | ICD-10-CM

## 2022-07-02 DIAGNOSIS — G43019 Migraine without aura, intractable, without status migrainosus: Secondary | ICD-10-CM

## 2022-07-02 MED ORDER — TOPIRAMATE 100 MG PO TABS: ORAL_TABLET | ORAL | 3 refills | Status: DC

## 2022-07-02 MED ORDER — LEVETIRACETAM 500 MG PO TABS
ORAL_TABLET | ORAL | 3 refills | Status: DC
Start: 2022-07-02 — End: 2023-09-07

## 2022-07-02 MED ORDER — CYCLOBENZAPRINE HCL 5 MG PO TABS: ORAL_TABLET | ORAL | 11 refills | Status: AC
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
## 2023-02-01 ENCOUNTER — Encounter: Payer: Self-pay | Admitting: Neurology

## 2023-02-01 ENCOUNTER — Ambulatory Visit: Payer: Medicaid Other | Admitting: Neurology

## 2023-05-16 ENCOUNTER — Ambulatory Visit: Payer: Medicaid Other | Admitting: Neurology

## 2023-08-08 ENCOUNTER — Emergency Department (HOSPITAL_COMMUNITY)
Admission: EM | Admit: 2023-08-08 | Discharge: 2023-08-09 | Disposition: A | Discharge status: Home / Self Care | Payer: MEDICAID | Attending: Emergency Medicine | Admitting: Emergency Medicine

## 2023-08-08 ENCOUNTER — Emergency Department (HOSPITAL_COMMUNITY): Payer: MEDICAID

## 2023-08-08 ENCOUNTER — Encounter (HOSPITAL_COMMUNITY): Payer: Self-pay

## 2023-08-08 ENCOUNTER — Other Ambulatory Visit: Payer: Self-pay

## 2023-08-08 DIAGNOSIS — M542 Cervicalgia: Secondary | ICD-10-CM

## 2023-08-08 DIAGNOSIS — I8290 Acute embolism and thrombosis of unspecified vein: Secondary | ICD-10-CM

## 2023-08-08 DIAGNOSIS — Z7901 Long term (current) use of anticoagulants: Secondary | ICD-10-CM | POA: Insufficient documentation

## 2023-08-08 DIAGNOSIS — I82890 Acute embolism and thrombosis of other specified veins: Secondary | ICD-10-CM | POA: Diagnosis not present

## 2023-08-08 LAB — CBC
HCT: 44.8 % (ref 39.0–52.0)
Hemoglobin: 14.8 g/dL (ref 13.0–17.0)
MCH: 29 pg (ref 26.0–34.0)
MCHC: 33 g/dL (ref 30.0–36.0)
MCV: 87.7 fL (ref 80.0–100.0)
Platelets: 238 10*3/uL (ref 150–400)
RBC: 5.11 MIL/uL (ref 4.22–5.81)
RDW: 12.1 % (ref 11.5–15.5)
WBC: 5.5 10*3/uL (ref 4.0–10.5)
nRBC: 0 % (ref 0.0–0.2)

## 2023-08-08 LAB — BASIC METABOLIC PANEL
Anion gap: 9 (ref 5–15)
BUN: 18 mg/dL (ref 6–20)
CO2: 27 mmol/L (ref 22–32)
Calcium: 9.1 mg/dL (ref 8.9–10.3)
Chloride: 100 mmol/L (ref 98–111)
Creatinine, Ser: 0.93 mg/dL (ref 0.61–1.24)
GFR, Estimated: >60 mL/min (ref >60)
Glucose, Bld: 96 mg/dL (ref 70–99)
Potassium: 4.9 mmol/L (ref 3.5–5.1)
Sodium: 136 mmol/L (ref 135–145)

## 2023-08-08 MED ORDER — IOHEXOL 300 MG/ML  SOLN
75.0000 mL | Freq: Once | INTRAMUSCULAR | Status: AC | PRN
  Administered 2023-08-08: 75 mL via INTRAVENOUS

## 2023-08-08 NOTE — ED Provider Triage Note (Signed)
Emergency Medicine Provider Triage Evaluation Note  [name] , a 32 y.o. male  was evaluated in triage.  Pt complains of neck pain.  States he notices a bump in the right mid neck.  Was initially painful and is painful now.  Denies any trouble swallowing or breathing at this time.  Reports history of IV drug use.  States he would administer IV drugs in his neck.  Had an infection in his neck 2 years ago.  Now on Suboxone and Xanax.  No longer using IV drugs.  Denies fever and chills.  Review of Systems  Positive: See above Negative: See above  Physical Exam  BP 132/87   Pulse 87   Temp 98 F (36.7 C) (Oral)   Resp 18   Ht 5\' 6"  (1.676 m)   Wt 68 kg   SpO2 100%   BMI 24.21 kg/m  Gen:   Awake, no distress   Resp:  Normal effort  MSK:   Moves extremities without difficulty  Other:  Firm nonfluctuant mass noted in the right anterior mid neck  Medical Decision Making  Medically screening exam initiated at 7:13 PM.  Appropriate orders placed.  [name] was informed that the remainder of the evaluation will be completed by another provider, this initial triage assessment does not replace that evaluation, and the importance of remaining in the ED until their evaluation is complete.  Work up started   [name], [place] 08/08/23 [phone]

## 2023-08-08 NOTE — ED Triage Notes (Signed)
Patient has had neck pain since yesterday. Feels swollen. Denies burning when he swallows.

## 2023-08-09 NOTE — ED Provider Notes (Signed)
[place] EMERGENCY DEPARTMENT AT [facility] Provider Note   CSN: [id number] Arrival date & time: 08/08/23  1840     History  Chief Complaint  Patient presents with   Neck Pain    [name] is a 32 y.o. male.  HPI   Patient with medical history including IV drug use, seizure disorder, right-sided neck abscess, internal jugular thrombosis, presenting with complaints of neck pain.  Patient has not started yesterday, states he feels pain on the right side of his neck, states it feels as if it is swollen, denies any difficulty swallowing, tongue throat lip swelling difficulty breathing, denies any subjective fevers chills cough congestion, he denies any illicit drug use.  He states he is here because he just wants to make sure he does not have another infection in his neck like last time.  He denies any chest pain shortness of breath denies any paresthesia or weakness in the upper extremities.  I reviewed patient's chart patient had noted right-sided neck abscess, underwent I&D by Dr. Annalee Genta, was also evaluated by vascular surgery, was placed on Eliquis, and was later follow-up with infectious disease.  Patient is currently not on anticoag at this time.  Home Medications Prior to Admission medications   Medication Sig Start Date End Date Taking? Authorizing Provider  Buprenorphine HCl-Naloxone HCl 8-2 MG FILM Place 1 Film under the tongue 2 (two) times daily.    [provider]  cyclobenzaprine (FLEXERIL) 5 MG tablet Take 1 tablet at night as needed for headache/neck pain 07/02/22   Van Clines, MD  ELIQUIS 5 MG TABS tablet Take 5 mg by mouth 2 (two) times daily. 02/25/22   [provider]  levETIRAcetam (KEPPRA) 500 MG tablet Take 1 and 1/2 tablets twice a day 07/02/22   Van Clines, MD  topiramate (TOPAMAX) 100 MG tablet Take 1 and 1/2 tablets twice a day 07/02/22   Van Clines, MD      Allergies    Amoxicillin, Ceclor [cefaclor],  Erythromycin, Penicillins, Septra [sulfamethoxazole-trimethoprim], and Tylenol [acetaminophen]    Review of Systems   Review of Systems  Constitutional:  Negative for chills and fever.  HENT:  Positive for sore throat.   Respiratory:  Negative for shortness of breath.   Cardiovascular:  Negative for chest pain.  Gastrointestinal:  Negative for abdominal pain.  Neurological:  Negative for headaches.    Physical Exam Updated Vital Signs BP 122/82   Pulse 81   Temp 98.3 F (36.8 C) (Oral)   Resp 18   Ht 5\' 6"  (1.676 m)   Wt 68 kg   SpO2 100%   BMI 24.21 kg/m  Physical Exam Vitals and nursing note reviewed.  Constitutional:      General: He is not in acute distress.    Appearance: He is not ill-appearing.  HENT:     Head: Normocephalic and atraumatic.     Nose: No congestion.     Mouth/Throat:     Mouth: Mucous membranes are moist.     Pharynx: Oropharynx is clear. No oropharyngeal exudate or posterior oropharyngeal erythema.     Comments: Patient has noted varicose veins of the neck, there is no obvious erythema or edema on my exam, no trismus no torticollis no oral edema present, tongue uvula both midline controlling or secretions tonsils both use much bilaterally no submandibular swelling no muffled tone voice. Eyes:     Conjunctiva/sclera: Conjunctivae normal.  Cardiovascular:     Rate and  Rhythm: Normal rate and regular rhythm.     Pulses: Normal pulses.     Heart sounds: No murmur heard.    No friction rub. No gallop.  Pulmonary:     Effort: No respiratory distress.     Breath sounds: No wheezing, rhonchi or rales.  Skin:    General: Skin is warm and dry.  Neurological:     Mental Status: He is alert.  Psychiatric:        Mood and Affect: Mood normal.     ED Results / Procedures / Treatments   Labs (all labs ordered are listed, but only abnormal results are displayed) Labs Reviewed  CBC  BASIC METABOLIC PANEL    EKG None  Radiology CT Soft Tissue  Neck W Contrast  Result Date: 08/08/2023 CLINICAL DATA:  Soft tissue infection suspected. Neck pain with bump on right side of neck. EXAM: CT NECK WITH CONTRAST TECHNIQUE: Multidetector CT imaging of the neck was performed using the standard protocol following the bolus administration of intravenous contrast. RADIATION DOSE REDUCTION: This exam was performed according to the departmental dose-optimization program which includes automated exposure control, adjustment of the mA and/or kV according to patient size and/or use of iterative reconstruction technique. CONTRAST:  75mL OMNIPAQUE IOHEXOL 300 MG/ML  SOLN COMPARISON:  11/11/2021 FINDINGS: Pharynx and larynx: No evidence of inflammation or mass. Salivary glands: Normal Thyroid: Unremarkable Lymph nodes: None enlarged or heterogeneous Vascular: Bilateral lower IJ occlusion with multiple venous collaterals in the neck. Clips in the lower right neck where the lower IJ was previously affected by septic thrombophlebitis. Tubular nonenhancing subcutaneous structure in the right ventral neck suggesting superficial venous thrombosis. No regional inflammation or drainable collection. History of remote IV drug abuse with neck injections. Limited intracranial: Negative Visualized orbits: Negative Mastoids and visualized paranasal sinuses: Partial right mastoid opacification since prior. Skeleton: No acute finding Upper chest: No acute finding IMPRESSION: 1. Subcutaneous varix thrombosis in the right neck. 2. 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 3. No drainable collection. Electronically Signed   By: Tiburcio Pea M.D.   On: 08/08/2023 22:50    Procedures Procedures    Medications Ordered in ED Medications  iohexol (OMNIPAQUE) 300 MG/ML solution 75 mL (75 mLs Intravenous Contrast Given 08/08/23 2049)    ED Course/ Medical Decision Making/ A&P                                 Medical Decision Making  This patient  presents to the ED for concern of neck pain, this involves an extensive number of treatment options, and is a complaint that carries with it a high risk of complications and morbidity.  The differential diagnosis includes peritonsillar/retropharyngeal abscess, Ludwig angina,    Additional history obtained:  Additional history obtained from N/A External records from outside source obtained and reviewed including recent hospitalization notes   Co morbidities that complicate the patient evaluation  Intravenous drug use, chronic internal jugular thrombosis,  Social Determinants of Health:  N/A    Lab Tests:  I Ordered, and personally interpreted labs.  The pertinent results include: CBC is unremarkable, CMP is unremarkable,   Imaging Studies ordered:  I ordered imaging studies including CT soft tissue neck I independently visualized and interpreted imaging which showed subcutaneous verrucous thrombosis of the right neck bilateral lower IJ occlusion I agree with the radiologist interpretation   Cardiac Monitoring:  The  patient was maintained on a cardiac monitor.  I personally viewed and interpreted the cardiac monitored which showed an underlying rhythm of: N/A   Medicines ordered and prescription drug management:  I ordered medication including N/A I have reviewed the patients home medicines and have made adjustments as needed  Critical Interventions:  N/A   Reevaluation:  Presents with neck pain, triage obtain basic lab workup imaging which I personally reviewed, he has a benign physical exam, due to this new thrombosis, will consult with vascular surgery for further recommendations  Patient was updated on these recommendations has no complaints and is in agreement with discharge at this time.  Consultations Obtained:  I requested consultation with the Dr. Chestine Spore vascular surgery,  and discussed lab and imaging findings as well as pertinent plan - they recommend:  Conservative management, likely resolve on its own, does not need outpatient follow-up by vascular surgery.    Test Considered:  N/A    Rule out I have low suspicion for peritonsillar abscess, retropharyngeal abscess, or Ludwig angina as oropharynx was visualized tongue and uvula were both midline, there is no exudates, erythema or edema noted in the posterior pillars or on/ around tonsils.      Dispostion and problem list  After consideration of the diagnostic results and the patients response to treatment, I feel that the patent would benefit from discharge.  Superficial thrombosis-will recommend warm compresses, NSAIDs, can follow-up with his PCP as needed strict return precautions.            Final Clinical Impression(s) / ED Diagnoses Final diagnoses:  Neck pain  Thrombosis    Rx / DC Orders ED Discharge Orders     None         Carroll Sage, PA-C 08/09/23 0409    Tilden Fossa, MD 08/09/23 [phone]

## 2023-08-09 NOTE — Discharge Instructions (Addendum)
You have a small blood clot on the in your neck, these typically resolve on its own, recommend symptom management, warm compresses, NSAIDs.   Please follow-up with your primary doctor in a week's time for reassessment.  Come back to the emergency department if you develop chest pain, shortness of breath, severe abdominal pain, uncontrolled nausea, vomiting, diarrhea.

## 2023-09-05 ENCOUNTER — Other Ambulatory Visit: Payer: Self-pay | Admitting: Neurology

## 2023-09-05 DIAGNOSIS — R569 Unspecified convulsions: Secondary | ICD-10-CM

## 2023-09-05 DIAGNOSIS — G43019 Migraine without aura, intractable, without status migrainosus: Secondary | ICD-10-CM

## 2023-09-08 ENCOUNTER — Telehealth: Payer: Self-pay | Admitting: Neurology

## 2023-09-08 DIAGNOSIS — G43019 Migraine without aura, intractable, without status migrainosus: Secondary | ICD-10-CM

## 2023-09-08 DIAGNOSIS — R569 Unspecified convulsions: Secondary | ICD-10-CM

## 2023-09-08 MED ORDER — LEVETIRACETAM 500 MG PO TABS: ORAL_TABLET | ORAL | 3 refills | Status: DC

## 2023-09-08 MED ORDER — TOPIRAMATE 100 MG PO TABS: ORAL_TABLET | ORAL | 3 refills | Status: DC

## 2023-09-08 NOTE — Telephone Encounter (Signed)
1. Which medications need to be refilled? (please list name of each medication and dose if known) levETIRAcetam (KEPPRA) 500 MG tablet [[id number]] and topiramate (TOPAMAX) 100 MG tablet [[id number]]   2. Which pharmacy/location (including street and city if local pharmacy) is medication to be sent to? [facility] - [place], [place] - [address] [place] [address]  3. Do they need a 30 day or 90 day supply?   Caller is completely out TOPAMAX and almost out of KEPPRA. Needs refill to last until next follow up appointment.

## 2023-11-28 ENCOUNTER — Other Ambulatory Visit: Payer: Self-pay | Admitting: Neurology

## 2023-11-28 DIAGNOSIS — R569 Unspecified convulsions: Secondary | ICD-10-CM

## 2023-11-28 DIAGNOSIS — G43019 Migraine without aura, intractable, without status migrainosus: Secondary | ICD-10-CM

## 2024-02-16 ENCOUNTER — Other Ambulatory Visit: Payer: Self-pay

## 2024-02-16 DIAGNOSIS — G43019 Migraine without aura, intractable, without status migrainosus: Secondary | ICD-10-CM

## 2024-02-16 DIAGNOSIS — R569 Unspecified convulsions: Secondary | ICD-10-CM

## 2024-02-16 MED ORDER — LEVETIRACETAM 500 MG PO TABS: ORAL_TABLET | ORAL | 0 refills | Status: DC

## 2024-02-16 MED ORDER — TOPIRAMATE 100 MG PO TABS: ORAL_TABLET | ORAL | 0 refills | Status: DC

## 2024-04-13 ENCOUNTER — Ambulatory Visit (INDEPENDENT_AMBULATORY_CARE_PROVIDER_SITE_OTHER): Payer: Medicaid Other | Admitting: Neurology

## 2024-04-13 ENCOUNTER — Encounter: Payer: Self-pay | Admitting: Neurology

## 2024-04-13 VITALS — BP 121/77 | HR 80 | Ht 65.0 in | Wt 145.2 lb

## 2024-04-13 DIAGNOSIS — R569 Unspecified convulsions: Secondary | ICD-10-CM

## 2024-04-13 DIAGNOSIS — G43019 Migraine without aura, intractable, without status migrainosus: Secondary | ICD-10-CM

## 2024-04-13 MED ORDER — TOPIRAMATE 200 MG PO TABS: 200.0000 mg | ORAL_TABLET | Freq: Two times a day (BID) | ORAL | 3 refills | Status: DC

## 2024-04-13 MED ORDER — LEVETIRACETAM 500 MG PO TABS: ORAL_TABLET | ORAL | 3 refills | Status: DC
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
## 2024-09-14 ENCOUNTER — Other Ambulatory Visit: Payer: Self-pay

## 2024-09-14 MED ORDER — LEVETIRACETAM 500 MG PO TABS: ORAL_TABLET | ORAL | 1 refills | Status: AC

## 2024-09-14 MED ORDER — TOPIRAMATE 200 MG PO TABS: 200.0000 mg | ORAL_TABLET | Freq: Two times a day (BID) | ORAL | 1 refills | Status: AC

## 2024-11-23 ENCOUNTER — Ambulatory Visit: Payer: Self-pay | Admitting: Neurology

## 2024-11-23 ENCOUNTER — Encounter: Payer: Self-pay | Admitting: Neurology
